# Patient Record
Sex: Male | Born: 1950 | Race: White | Hispanic: No | Marital: Married | Smoking: Never smoker
Health system: Southern US, Community
[De-identification: ages and names within clinical notes are randomized; demographics above are authoritative.]

## PROBLEM LIST (undated history)

## (undated) DIAGNOSIS — F1010 Alcohol abuse, uncomplicated: Secondary | ICD-10-CM

## (undated) DIAGNOSIS — E669 Obesity, unspecified: Secondary | ICD-10-CM

## (undated) DIAGNOSIS — H9319 Tinnitus, unspecified ear: Secondary | ICD-10-CM

## (undated) DIAGNOSIS — R0789 Other chest pain: Secondary | ICD-10-CM

## (undated) DIAGNOSIS — N400 Enlarged prostate without lower urinary tract symptoms: Secondary | ICD-10-CM

## (undated) DIAGNOSIS — I10 Essential (primary) hypertension: Secondary | ICD-10-CM

## (undated) DIAGNOSIS — I499 Cardiac arrhythmia, unspecified: Secondary | ICD-10-CM

## (undated) DIAGNOSIS — R519 Headache, unspecified: Secondary | ICD-10-CM

## (undated) DIAGNOSIS — C4490 Unspecified malignant neoplasm of skin, unspecified: Secondary | ICD-10-CM

## (undated) DIAGNOSIS — R51 Headache: Secondary | ICD-10-CM

## (undated) DIAGNOSIS — K5792 Diverticulitis of intestine, part unspecified, without perforation or abscess without bleeding: Secondary | ICD-10-CM

## (undated) DIAGNOSIS — K219 Gastro-esophageal reflux disease without esophagitis: Secondary | ICD-10-CM

## (undated) DIAGNOSIS — N189 Chronic kidney disease, unspecified: Secondary | ICD-10-CM

## (undated) DIAGNOSIS — R7303 Prediabetes: Secondary | ICD-10-CM

## (undated) DIAGNOSIS — J189 Pneumonia, unspecified organism: Secondary | ICD-10-CM

## (undated) DIAGNOSIS — D649 Anemia, unspecified: Secondary | ICD-10-CM

## (undated) DIAGNOSIS — F419 Anxiety disorder, unspecified: Secondary | ICD-10-CM

## (undated) DIAGNOSIS — M1990 Unspecified osteoarthritis, unspecified site: Secondary | ICD-10-CM

## (undated) DIAGNOSIS — E7800 Pure hypercholesterolemia, unspecified: Secondary | ICD-10-CM

## (undated) DIAGNOSIS — Z87442 Personal history of urinary calculi: Secondary | ICD-10-CM

## (undated) DIAGNOSIS — D72819 Decreased white blood cell count, unspecified: Secondary | ICD-10-CM

## (undated) DIAGNOSIS — C4491 Basal cell carcinoma of skin, unspecified: Secondary | ICD-10-CM

## (undated) DIAGNOSIS — K625 Hemorrhage of anus and rectum: Secondary | ICD-10-CM

## (undated) HISTORY — DX: Headache, unspecified: R51.9

## (undated) HISTORY — PX: HERNIA REPAIR: SHX51

## (undated) HISTORY — PX: BACK SURGERY: SHX140

## (undated) HISTORY — DX: Hemorrhage of anus and rectum: K62.5

## (undated) HISTORY — DX: Prediabetes: R73.03

## (undated) HISTORY — DX: Diverticulitis of intestine, part unspecified, without perforation or abscess without bleeding: K57.92

## (undated) HISTORY — DX: Basal cell carcinoma of skin, unspecified: C44.91

## (undated) HISTORY — DX: Obesity, unspecified: E66.9

## (undated) HISTORY — DX: Benign prostatic hyperplasia without lower urinary tract symptoms: N40.0

## (undated) HISTORY — DX: Headache: R51

## (undated) HISTORY — DX: Alcohol abuse, uncomplicated: F10.10

## (undated) HISTORY — DX: Pure hypercholesterolemia, unspecified: E78.00

## (undated) HISTORY — DX: Unspecified malignant neoplasm of skin, unspecified: C44.90

## (undated) HISTORY — DX: Other chest pain: R07.89

## (undated) HISTORY — PX: OTHER SURGICAL HISTORY: SHX169

## (undated) HISTORY — DX: Unspecified osteoarthritis, unspecified site: M19.90

## (undated) HISTORY — DX: Gastro-esophageal reflux disease without esophagitis: K21.9

## (undated) HISTORY — PX: EYE SURGERY: SHX253

## (undated) HISTORY — DX: Tinnitus, unspecified ear: H93.19

## (undated) HISTORY — DX: Decreased white blood cell count, unspecified: D72.819

---

## 1999-08-14 ENCOUNTER — Other Ambulatory Visit: Admission: RE | Admit: 1999-08-14 | Discharge: 1999-08-14 | Payer: Self-pay | Admitting: Podiatry

## 2000-04-06 ENCOUNTER — Encounter: Payer: Self-pay | Admitting: Emergency Medicine

## 2000-04-07 ENCOUNTER — Encounter: Payer: Self-pay | Admitting: Neurosurgery

## 2000-04-07 ENCOUNTER — Inpatient Hospital Stay (HOSPITAL_COMMUNITY): Admission: EM | Admit: 2000-04-07 | Discharge: 2000-04-09 | Payer: Self-pay | Admitting: Emergency Medicine

## 2000-04-21 ENCOUNTER — Encounter: Admission: RE | Admit: 2000-04-21 | Discharge: 2000-05-25 | Payer: Self-pay | Admitting: Neurosurgery

## 2001-09-11 HISTORY — PX: COLONOSCOPY: SHX174

## 2001-09-11 HISTORY — PX: OTHER SURGICAL HISTORY: SHX169

## 2001-11-02 ENCOUNTER — Encounter (INDEPENDENT_AMBULATORY_CARE_PROVIDER_SITE_OTHER): Payer: Self-pay | Admitting: Specialist

## 2001-11-02 ENCOUNTER — Ambulatory Visit (HOSPITAL_COMMUNITY): Admission: RE | Admit: 2001-11-02 | Discharge: 2001-11-02 | Payer: Self-pay | Admitting: *Deleted

## 2001-11-08 ENCOUNTER — Ambulatory Visit (HOSPITAL_BASED_OUTPATIENT_CLINIC_OR_DEPARTMENT_OTHER): Admission: RE | Admit: 2001-11-08 | Discharge: 2001-11-08 | Payer: Self-pay | Admitting: Orthopedic Surgery

## 2001-11-09 HISTORY — PX: OTHER SURGICAL HISTORY: SHX169

## 2002-08-17 ENCOUNTER — Encounter: Payer: Self-pay | Admitting: Family Medicine

## 2002-08-17 LAB — CONVERTED CEMR LAB
Blood Glucose, Fasting: 103 mg/dL
PSA: 1 ng/mL

## 2002-10-03 ENCOUNTER — Ambulatory Visit (HOSPITAL_BASED_OUTPATIENT_CLINIC_OR_DEPARTMENT_OTHER): Admission: RE | Admit: 2002-10-03 | Discharge: 2002-10-03 | Payer: Self-pay | Admitting: Orthopedic Surgery

## 2005-03-03 ENCOUNTER — Encounter (INDEPENDENT_AMBULATORY_CARE_PROVIDER_SITE_OTHER): Payer: Self-pay | Admitting: *Deleted

## 2005-03-03 ENCOUNTER — Ambulatory Visit (HOSPITAL_COMMUNITY): Admission: RE | Admit: 2005-03-03 | Discharge: 2005-03-03 | Payer: Self-pay | Admitting: *Deleted

## 2005-07-14 ENCOUNTER — Observation Stay (HOSPITAL_COMMUNITY): Admission: EM | Admit: 2005-07-14 | Discharge: 2005-07-16 | Payer: Self-pay | Admitting: Emergency Medicine

## 2005-07-16 ENCOUNTER — Ambulatory Visit: Payer: Self-pay

## 2005-07-16 ENCOUNTER — Ambulatory Visit: Payer: Self-pay | Admitting: Cardiology

## 2006-07-06 ENCOUNTER — Encounter: Admission: RE | Admit: 2006-07-06 | Discharge: 2006-07-06 | Payer: Self-pay | Admitting: *Deleted

## 2007-01-13 ENCOUNTER — Encounter: Admission: RE | Admit: 2007-01-13 | Discharge: 2007-01-13 | Payer: Self-pay | Admitting: Neurosurgery

## 2007-03-02 ENCOUNTER — Ambulatory Visit (HOSPITAL_COMMUNITY): Admission: RE | Admit: 2007-03-02 | Discharge: 2007-03-04 | Payer: Self-pay | Admitting: Neurosurgery

## 2007-12-14 ENCOUNTER — Encounter (INDEPENDENT_AMBULATORY_CARE_PROVIDER_SITE_OTHER): Payer: Self-pay | Admitting: *Deleted

## 2007-12-14 ENCOUNTER — Ambulatory Visit (HOSPITAL_COMMUNITY): Admission: RE | Admit: 2007-12-14 | Discharge: 2007-12-14 | Payer: Self-pay | Admitting: *Deleted

## 2008-07-21 ENCOUNTER — Inpatient Hospital Stay (HOSPITAL_COMMUNITY): Admission: RE | Admit: 2008-07-21 | Discharge: 2008-07-23 | Payer: Self-pay | Admitting: General Surgery

## 2008-08-31 ENCOUNTER — Encounter: Payer: Self-pay | Admitting: Family Medicine

## 2008-08-31 DIAGNOSIS — E7800 Pure hypercholesterolemia, unspecified: Secondary | ICD-10-CM | POA: Insufficient documentation

## 2008-08-31 DIAGNOSIS — Z87898 Personal history of other specified conditions: Secondary | ICD-10-CM

## 2009-04-29 DIAGNOSIS — E785 Hyperlipidemia, unspecified: Secondary | ICD-10-CM | POA: Insufficient documentation

## 2009-04-29 DIAGNOSIS — R0789 Other chest pain: Secondary | ICD-10-CM | POA: Insufficient documentation

## 2009-04-29 DIAGNOSIS — F1021 Alcohol dependence, in remission: Secondary | ICD-10-CM | POA: Insufficient documentation

## 2009-05-01 ENCOUNTER — Encounter: Payer: Self-pay | Admitting: Cardiovascular Disease

## 2009-06-14 ENCOUNTER — Encounter: Payer: Self-pay | Admitting: Cardiovascular Disease

## 2009-06-19 ENCOUNTER — Ambulatory Visit: Payer: Self-pay | Admitting: Cardiovascular Disease

## 2009-06-19 ENCOUNTER — Encounter: Payer: Self-pay | Admitting: Cardiology

## 2009-06-19 DIAGNOSIS — R002 Palpitations: Secondary | ICD-10-CM | POA: Insufficient documentation

## 2009-06-19 DIAGNOSIS — R079 Chest pain, unspecified: Secondary | ICD-10-CM | POA: Insufficient documentation

## 2009-06-25 ENCOUNTER — Encounter: Payer: Self-pay | Admitting: Cardiovascular Disease

## 2009-06-29 ENCOUNTER — Encounter: Payer: Self-pay | Admitting: Cardiology

## 2009-07-02 ENCOUNTER — Telehealth (INDEPENDENT_AMBULATORY_CARE_PROVIDER_SITE_OTHER): Payer: Self-pay | Admitting: *Deleted

## 2009-07-03 ENCOUNTER — Ambulatory Visit (HOSPITAL_COMMUNITY): Admission: RE | Admit: 2009-07-03 | Discharge: 2009-07-03 | Payer: Self-pay | Admitting: Cardiovascular Disease

## 2009-07-03 ENCOUNTER — Ambulatory Visit: Payer: Self-pay

## 2009-07-03 ENCOUNTER — Telehealth: Payer: Self-pay | Admitting: Cardiology

## 2009-07-03 ENCOUNTER — Encounter: Payer: Self-pay | Admitting: Cardiovascular Disease

## 2009-07-03 ENCOUNTER — Ambulatory Visit: Payer: Self-pay | Admitting: Cardiology

## 2009-07-03 ENCOUNTER — Encounter (HOSPITAL_COMMUNITY): Admission: RE | Admit: 2009-07-03 | Discharge: 2009-08-10 | Payer: Self-pay | Admitting: Cardiovascular Disease

## 2009-10-31 ENCOUNTER — Ambulatory Visit (HOSPITAL_COMMUNITY): Admission: RE | Admit: 2009-10-31 | Discharge: 2009-10-31 | Payer: Self-pay | Admitting: Internal Medicine

## 2010-12-24 NOTE — H&P (Signed)
NAMEGIORGI, DEBRUIN              ACCOUNT NO.:  [id number]   MEDICAL RECORD NO.:  [id number]          PATIENT TYPE:  OIB   LOCATION:  3031                         FACILITY:  [facility]   PHYSICIAN:  Hilda Lias, M.D.   DATE OF BIRTH:  Aug 22, 1950   DATE OF ADMISSION:  03/02/2007  DATE OF DISCHARGE:                              HISTORY & PHYSICAL   Mr. Capell is a gentleman who was seen because of back pain, radiation  to the right leg, mostly at the level of the thigh.  The pain is getting  worse, is excruciating.  He had been taking medication that is not  helping.  He had a MRI and sent to [place] for evaluation.   PAST MEDICAL HISTORY:  He has a lumbar diskectomy about 35 years ago.  He had surgery in shoulder twice.   ALLERGIES:  THE PATIENT ALLERGIC TO SULFA AND PERCODAN.   SOCIAL HISTORY:  Negative.   FAMILY HISTORY:  Unremarkable.   REVIEW OF SYSTEMS:  Positive for enlarged prostate, back pain,  indigestion.   PHYSICAL EXAMINATION:  GENERAL:  The patient came to my office, limp  from the right leg.  HEAD, NOSE AND THROAT:  Normal.  NECK:  Normal.  LUNGS:  Clear.  HEART:  Sounds normal.  ABDOMEN:  Normal.  EXTREMITIES:  Normal pulses.  NEURO:  He has straight leg raising, SLR is positive at 80 degrees in  the left side and 60 on the right side.  There is a decreased  flexibility of the lumbar spine.   The x-rays showed degenerative arthritis.  The MRI showed that he has a  herniated disk at the level of 2-3 central and to the right.   IMPRESSION:  Right L2-3 herniated disk with back pain.  Status post  lumbar diskectomy 24 years ago, degenerative disk disease.   RECOMMENDATIONS:  The patient is being admitted for surgery.  Procedure  will be a right L2 and 3 diskectomy.  He knows about the risks of  infection, CSF leak, and no improvement whatsoever and need for further  surgery.          ______________________________  Hilda Lias, M.D.    EB/MEDQ  D:   03/02/2007  T:  03/02/2007  Job:  [id number]

## 2010-12-24 NOTE — Op Note (Signed)
Donald Vaughn, Donald Vaughn              ACCOUNT NO.:  [id number]   MEDICAL RECORD NO.:  [id number]          PATIENT TYPE:  AMB   LOCATION:  ENDO                         FACILITY:  [facility]   PHYSICIAN:  Georgiana Spinner, M.D.    DATE OF BIRTH:  09/29/1950   DATE OF PROCEDURE:  12/14/2007  DATE OF DISCHARGE:                               OPERATIVE REPORT   PROCEDURE:  Upper endoscopy with biopsy.   INDICATIONS:  Abdominal pain.   ANESTHESIA:  Demerol 70 mg, Versed 7.5 mg.   PROCEDURE:  With the patient mildly sedated in the left lateral  decubitus position, the Pentax videoscopic endoscope was inserted in the  mouth and passed under direct vision through the esophagus which  appeared normal until we reached  the distal esophagus and there was a  question of Barrett's,  photographed and biopsied.  We entered into the  stomach.  The fundus, body, antrum, duodenal bulb, and the second  portion of  the duodenum appeared normal.  From this point,  the  endoscope was slowly withdrawn, taking circumferential views of duodenal  mucosa until the endoscope had been pulled back into the stomach and  placed in retroflexion to view the stomach from below.  The endoscope  was straightened and withdrawn taking circumferential views of remaining  gastric and esophageal mucosa.  The patient's vital signs and pulse  oximeter remained stable.  The patient tolerated the procedure well  without apparent complications.   FINDINGS:  Widely patent GE junction indicating laxity of the lower  esophageal sphincter, question of Barrett's esophagus,  biopsied.  Await  biopsy report.  The patient will call me for results and follow-up with  me as an outpatient.           ______________________________  Georgiana Spinner, M.D.     GMO/MEDQ  D:  12/14/2007  T:  12/14/2007  Job:  [id number]

## 2010-12-24 NOTE — Op Note (Signed)
NAMESHALAMAR, CRAYS              ACCOUNT NO.:  [id number]   MEDICAL RECORD NO.:  [id number]          PATIENT TYPE:  OIB   LOCATION:  3172                         FACILITY:  [facility]   PHYSICIAN:  Hilda Lias, M.D.   DATE OF BIRTH:  1951-02-16   DATE OF PROCEDURE:  03/02/2007  DATE OF DISCHARGE:                               OPERATIVE REPORT   PREOPERATIVE DIAGNOSES:  1. Right L2-L3 herniated disk with fragment.  2. Status post lumbar diskectomy 25 years ago.   POSTOPERATIVE DIAGNOSES:  1. Right L2-L3 herniated disk with fragment.  2. Status post lumbar diskectomy 25 years ago.   PROCEDURE:  1. Right L2-L3 laminotomy.  2. Removal of fragment in the body of L3.  3. Microscope.   SURGEON:  Hilda Lias, M.D.   ASSISTANT:  Clydene Fake, M.D.   CLINICAL HISTORY:  The patient was admitted because of back pain with  radiation down to the right leg.  X-rays show that he has a fragment at  the level of 2-3 to the right side.  The patient previously had surgery  in the lower lumbar area and he has some sclerotic changes.  The patient  wanted to proceed with surgery.   PROCEDURE IN DETAIL:  The patient was taken to the operating room and he  was positioned appropriately.  The back was prepped with DuraPrep.  X-  ray showed that we were at the level of L2.  From then on, drapes were  applied.  An incision from L2 to L3 was made.  With the microscope, we  did a laminotomy from L2 up on the upper part of L3 and would remove 1/3  of the medial facet.  The yellow ligament was also excised.  We brought  the microscope into the area.  Repeat x-rays showed indeed we were at  the level of 2-3.  From then on, we investigated the L2 nerve root and  it was completely clean.  At the level of L3, we did a foraminotomy and  there was no compromise.  Nevertheless, at the body of L2 subligamentous  space, there were two pieces of fragments which were removed.  At the  end, we have  decompression with the thecal sac and the L2 and L3 nerve  root.  Next, the ischial bone and root at the level of the shoulder, at  the level of axilla at both of them, the L2-L3 showed there were no  evidence of any fragment.  Foraminotomy was accomplished.  There was  plenty of room for the L2, L3 nerve roots.  Investigation medially of the canal was negative.  From then on, the  area was irrigated.  Valsalva maneuver was negative.  Then Depo-Medrol  and fentanyl were left in the epidural space and the wound was closed  with Vicryl and Steri-Strips.           ______________________________  Hilda Lias, M.D.     EB/MEDQ  D:  03/02/2007  T:  03/02/2007  Job:  [id number]

## 2010-12-24 NOTE — Op Note (Signed)
Donald Vaughn, Donald Vaughn              ACCOUNT NO.:  [id number]   MEDICAL RECORD NO.:  [id number]          PATIENT TYPE:  AMB   LOCATION:  DAY                          [facility]ACILITY[facility]  [facility]   PHYSICIAN:  Juanetta Gosling, MDDATE OF BIRTH:  [birth year]   DATE OF PROCEDURE:  07/21/2008  DATE OF DISCHARGE:                               OPERATIVE REPORT   PREOPERATIVE DIAGNOSIS:  Umbilical hernia.   POSTOPERATIVE DIAGNOSIS:  Umbilical hernia.   PROCEDURE:  1. Diagnostic laparoscopy.  2. Laparoscopic tacking of the mesh to the anterior abdominal wall.  3. Open umbilical hernia repair with 6.4 cm Proceed ventral patch.   SURGEON:  Troy Sine. Dwain Sarna, MD   ASSISTANT:  None.   ANESTHESIA:  General.   FINDINGS:  A 2.5 cm umbilical hernia with diastasis recti.   SPECIMENS:  None.   DRAINS:  None.   ESTIMATED BLOOD LOSS:  Minimal.   COMPLICATIONS:  None.   DISPOSITION:  To PACU in stable condition.   INDICATIONS:  The is a 60 year old male with a 3 or 4 month history of  increasing in size umbilical bulge.  He describes it as painful whenever  he sneezes or coughs.  He had a colonoscopy at age 11 which he reports  as normal.  On exam he had what appeared to be a 3 cm reducible tender  umbilical hernia with a diastasis recti present.  I discussed with him  that I wanted to do a diagnostic laparoscopy and possibly tack his mesh  and fix his umbilical hernia repair and he agreed to this.   PROCEDURE:  After informed consent was obtained the patient was taken to  the operating room.  He was administered 1 gram of intravenous cefazolin  without complication.  He then underwent general endotracheal anesthesia  without complication.  His abdomen was then prepped and draped in the  standard sterile surgical fashion.  Sequential compression devices were  in place during this procedure.  A surgical time-out was then performed.  A 5 mm incision was then made in the left upper quadrant and the  5 mm  OptiView trocar was then used to enter the abdomen without difficulty.  The abdomen was then insufflated to 15 mmHg pressure without  complication.  In viewing this, he had a small amount of material stuck  in his umbilical hernia.  This was reduced with the camera.  He had a  fairly small umbilical hernia there with a large diastasis above and I  elected to open him at that point and place a Proceed ventral patch.  I  then performed an infraumbilical curvilinear incision.  Dissection was  carried out down to the level of the umbilical stalk.  It was encircled  with a Kelly clamp.  This was then divided with electrocautery.  The  hole was about 2.5 cm at that point.  I then used a 6.4 cm Proceed  ventral patch and placed this intraabdominally.  I then placed an extra  5 mm port in the left lower quadrant.  I then used the ProTack to tack  the edges of  the Proceed ventral patch to the abdominal wall  successfully.  Upon completion of this I pulled the mesh up into  position and sutured the tails into position with a 2-0 Prolene in four  positions.  The mesh appeared to be in good position upon completion of  this and covered the defect thoroughly.  I then put the laparoscope back  in.  The mesh was still in good position with no evidence of any  injuries.  Upon completion of this, I then irrigated the wound.  Hemostasis was observed. The umbilicus was tacked back down with 3-0  Vicryl.  The dermis was closed with 4-0 Vicryl.  All skin incisions were  closed with 4-0 Monocryl in a subcuticular fashion.  Sterile dressing  was placed over the umbilicus.  The other incisions were closed with  Dermabond.  He was extubated in the operating room having tolerated this  well and transferred the recovery room.      Juanetta Gosling, MD  Electronically Signed     MCW/MEDQ  D:  07/21/2008  T:  07/21/2008  Job:  [id number]   cc:   Soyla Murphy. Renne Crigler, M.D.  Fax: [phone]

## 2010-12-27 NOTE — Procedures (Signed)
[facility]  Patient:    Donald Vaughn, Donald Vaughn Visit Number: [id number] MRN: [id number]          Service Type: END Location: ENDO Attending Physician:  Sabino Gasser Dictated by:   Sabino Gasser, M.D. Proc. Date: 11/02/01 Admit Date:  11/02/2001                             Procedure Report  PROCEDURE PERFORMED:  Colonoscopy.  ENDOSCOPIST:  Sabino Gasser, M.D.  INDICATIONS FOR PROCEDURE:  Hemoccult positivity.  ANESTHESIA:  Demerol 25 mg, Versed 2.5 mg.  DESCRIPTION OF PROCEDURE:  With the patient mildly sedated in the left lateral decubitus position, the Olympus videoscopic colonoscope was inserted in the rectum after rectal examination was performed and passed under direct vision into the cecum.  The cecum was identified by the ileocecal valve and appendiceal orifice.  In the cecum, the prep was slightly suboptimal, but no gross lesions were seen after washing.  From this point, the colonoscope was slowly withdrawn, taking circumferential views of the remaining colonic mucosa stopping only then in the rectum, which appeared normal on direct and showed hemorrhoids on retroflex view and also pull-through of the anal canal.  The endoscope was withdrawn.  Patients vital signs and pulse oximeter remained stable.  The patient tolerated the procedure well and without apparent complications.  FINDINGS:  Hemorrhoids, otherwise unremarkable examination.  PLAN:  See endoscopy note for further details. Dictated by:   Sabino Gasser, M.D. Attending Physician:  Sabino Gasser DD:  11/02/01 TD:  11/03/01 Job: [id number]

## 2010-12-27 NOTE — Op Note (Signed)
NAME:  Donald Vaughn, Donald Vaughn                        ACCOUNT NO.:  [id number]   MEDICAL RECORD NO.:  [id number]                   PATIENT TYPE:  AMB   LOCATION:  DSC                                  FACILITY:  [facility]   PHYSICIAN:  Robert A. Thurston Hole, M.D.              DATE OF BIRTH:  [birth year]   DATE OF PROCEDURE:  10/03/2002  DATE OF DISCHARGE:                                 OPERATIVE REPORT   PREOPERATIVE DIAGNOSIS:  Right shoulder partial rotator cuff tear with  acromioclavicular joint synovitis.   POSTOPERATIVE DIAGNOSIS:  Right shoulder partial rotator cuff tear with  acromioclavicular joint synovitis.   OPERATION:  1. Right shoulder examination under anesthesia, followed by arthroscopic     debridement partial rotator cuff tear.  2. Right shoulder distal clavicle revision and resection.   ANESTHESIA:  General.   SURGEON:  Robert A. Thurston Hole, M.D.   ASSISTANT:  Julien Girt, P.A.   OPERATIVE TIME:  40 minutes.   COMPLICATIONS:  None.   INDICATIONS FOR PROCEDURE:  Donald Vaughn is a 60 year old gentleman who had  previously undergone a right shoulder arthroscopy with subacromial  decompression partial rotator cuff, debridement of distal clavicle, excision  approximately 11 months ago secondary to workman's compensation injury.  He  has had persistent pain in this shoulder despite adequate conservative care  with repeat MRI exam showing persistent synovitis and tendonitis and  synovitis in the acromioclavicular joint and is now to undergo a arthroscopy  and revision of his distal clavicle excision.   PROCEDURE:  Donald Vaughn is brought to the operating room, 10/03/02 with a  intrascalene block and replaced by anesthesia in preoperative room for  postoperative pain control.  He was placed on the operating table in the  supine position.  After being placed under general anesthesia, his right  shoulder was examined under anesthesia.  He had full range of motion and his  shoulder was stable to ligamentous examination. His shoulder and arm was  prepped and draped using sterile technique.  Originally through a posterior  arthroscopic portal a pump attachment was placed into an anterior portal and  arthroscopic probe was placed.  On initial inspection of the articular  cartilage and the humeral joint was intact, anterior and posterior labrum  was intact.  Superior labrum, biceps tendon and ankle was intact.  Biceps  tendon was intact, inferior labrum, anterior and inferior humeral ligament  complexes intact.  Rotator cuff showed mild fraying, 10% on the undersurface  which was debrided.  The rest of the rotator cuff was intact and the  inferior capsular recess was free of pathology.  Subacromial space was  entered and lateral arthroscopic portal was made.  Previous bursectomy was  still satisfactory.  Previous subacromial decompression and I had a slight  revision to this, removing another 2-3 mm of the undersurface of the  anterior acromion.  Rotator cuff showed mild fraying, partial tearing of  the  supraspinatus, about 20%, which was debrided, otherwise, this was intact.  At this point, arthroscopic instruments were removed and a 2 cm longitudinal  incision was made over the acromioclavicular joint region.  The underlying  subcutaneous tissues were incised in line with the skin incision.  The  acromioclavicular capsule was incised and the region in the previous distal  clavicle incision was exposed.  Significant synovitis was found in this area  which was thoroughly debided and another 4-5 mm of distal clavicle was  resected with an oscillating saw.  After this was done, there was no further  synovitis from impingement at this joint and the shoulder could be walked  through a full range of motion with no impingement.  At this point, the  wound was irrigated and closed.  The acromioclavicular joint capsule was  closed with 2-0 Vicryl, subcutaneous tissues  closed with 2-0 Vicryl.  Subcuticular layer closed with 3-0 Prolene.  Steri-Strips were applied.  Sterile dressings and a sling applied and the patient awakened and taken to  the recovery room in stable condition.   FOLLOWUP:  Donald Vaughn will be followed as an outpatient on Percocet and  Arthrotec.  Begin early range of motion.  See him back in the office in a  week for sutures out and followed.  Begin early physical therapy.                                                Robert A. Thurston Hole, M.D.    RAW/MEDQ  D:  10/03/2002  T:  10/03/2002  Job:  [id number]   cc:   Workman's Compensation

## 2010-12-27 NOTE — H&P (Signed)
NAMEHERMILO, Donald Vaughn              ACCOUNT NO.:  [id number]   MEDICAL RECORD NO.:  [id number]          PATIENT TYPE:  INP   LOCATION:  1826                         FACILITY:  [facility]   PHYSICIAN:  Lonia Blood, M.D.      DATE OF BIRTH:  05-Mar-1951   DATE OF ADMISSION:  07/14/2005  DATE OF DISCHARGE:                                HISTORY & PHYSICAL   PRIMARY CARE PHYSICIAN:  Soyla Murphy. Renne Crigler, M.D.   CARDIOLOGIST:  Jaclyn Prime. Lucas Mallow, M.D.   CHIEF COMPLAINT:  Left-sided chest pain.   HISTORY OF PRESENT ILLNESS:  The patient is a 60 year old gentleman with no  significant past medical history except for remote BPH as well as prior  stress test that was normal about 3 years ago.  The patient came in  secondary to sudden onset of sharp, left-sided chest pain starting from  under his left ribs going under his left arm, not associated with shortness  of breath.  The patient felt some more pressure in his chest than anything  else.  He denied any diaphoresis.  Pain has since resolved, it lasted just a  few seconds.  He has had some mild recurrence while in the ED, otherwise, no  other complaint.  No cough and no fever.  He has been fairly active.  His  pain was not reproducible in the ED, but per patient after he stretched out  his chest muscles.   PAST MEDICAL HISTORY:  Mainly BPH.  The patient has not been on any  medications.  He gets regular checkups.  He only takes [employer] for  that.  Also history of dyslipidemia at one time.   MEDICATIONS:  None.   ALLERGIES:  The patient is allergic to SULFA, CODEINE, PERCODAN.   SOCIAL HISTORY:  The patient is married.  Denied any tobacco or alcohol use.  He has two grown children, 31 and 30, and they are healthy.  He works with  [employer] and he has been fairly active.   FAMILY HISTORY:  Denied any family history of coronary artery disease,  hypertension, or diabetes.   REVIEW OF SYSTEMS:  10-point review  of systems essentially as in HPI.   PHYSICAL EXAMINATION:  VITAL SIGNS:  The patient is afebrile with a  temperature of 98.5, blood pressure initially 145/88, currently 124/83,  pulse 91, respiratory rate 20, saturations 97% on room air.  GENERAL:  He is alert, oriented, very cumbersome man, slightly anxious, in  no acute distress.  HEENT:  PERRL, EOMI.  NECK:  Supple.  No JVD, no lymphadenopathy.  LUNGS:  Good air entry bilaterally.  No wheezes or rales.  CARDIOVASCULAR:  He has regular rate and rhythm.  ABDOMEN:  Soft and nontender with positive bowel sounds.  EXTREMITIES:  No cyanosis, clubbing, or edema.   LABORATORY DATA:  Sodium 138, potassium 4.1, chloride 106, BUN 20, glucose  97, bicarb 27.7.  His creatinine is 1.2, initial cardiac enzymes in the ED  negative.   Chest x-ray no acute disease.   EKG showed normal sinus rhythm with a rate  of 92, normal intervals, normal  axis.  There is what appears to be incomplete right bundle branch block, but  no old EKG to compare.   ASSESSMENT:  This is a 60 year old gentleman with risk factors for heart  disease, mainly history of dyslipidemia and male sex, presenting with what  appears to be sudden onset of left-sided chest pain.  The patient is low to  moderate risk for heart disease.  However, because of the ongoing chest  pain, we will admit the patient for rule out MI.  Other differentials like  dissection have been ruled out from the chest x-ray.  This could also be  esophageal in nature like esophageal tear or GERD, but based on the  symptomatology it did not sound like that.  Most likely this is all  musculoskeletal.  However, we will not dismiss other courses until we rule  them out especially cardiac in nature.   PLAN:  Chest pain.  We will admit the patient for 23-hour observation.  Check serial cardiac enzymes.  Give him some pain control.  He is on  morphine and Tylenol.  We will give him aspirin now.  We will follow  his  cardiac enzymes.  If his cardiac enzymes are negative overnight, we will  arrange for outpatient Cardiolite or some form of exercise stress test for  risk stratification.      Lonia Blood, M.D.  Electronically Signed     LG/MEDQ  D:  07/14/2005  T:  07/15/2005  Job:  [id number]

## 2010-12-27 NOTE — Consult Note (Signed)
Donald Vaughn, Donald Vaughn              ACCOUNT NO.:  [id number]   MEDICAL RECORD NO.:  [id number]          PATIENT TYPE:  INP   LOCATION:  2028                         FACILITY:  [facility]   PHYSICIAN:  Rollene Rotunda, M.D.   DATE OF BIRTH:  [birth year]   DATE OF CONSULTATION:  07/15/2005  DATE OF DISCHARGE:                                   CONSULTATION   CARDIOLOGY CONSULTATION   PRIMARY CARE PHYSICIAN:  Soyla Murphy. Renne Crigler, M.D.   REASON FOR CONSULTATION:  Evaluate patient with chest pain.   HISTORY OF PRESENT ILLNESS:  Very pleasant 60 year old white gentleman who  reports a stress test many years ago that was apparently normal.  He has  been well otherwise.  He has had some palpitations where he describes a  fluttering.  He points to his left breast.  He feels it sporadically.  It  seemed to increase over a few days.  Yesterday he developed this along with  some chest discomfort. It was again under the left breast.  He described a  burning.  It was mild.  He had some shooting discomfort up into his left arm  which prompted him to come to the emergency room.  Thus far enzymes have  been negative.  Electrocardiogram was without acute evidence of ischemia.  The patient otherwise denies any exertional chest discomfort, neck  discomfort, arm discomfort, activity induced nausea, vomiting, or excessive  diaphoresis. He has had no presyncope or syncope.  He denies any paroxysmal  nocturnal dyspnea, orthopnea.   PAST MEDICAL HISTORY:  He has had dyslipidemia which he has tried to manage  with diet.  (Lipids here demonstrate HDL 33, LDL 127).  The patient has no  history of hypertension, diabetes.   PAST SURGICAL HISTORY:  Rotator cuff surgery, back surgery, trauma to his  left foot in 7th grade with bird shot.   SOCIAL HISTORY:  The patient is married.  He quit drinking at age 76.  He  does not smoke cigarettes. He works for [employer].   FAMILY HISTORY:  Noncontributory for early coronary  disease.   REVIEW OF SYSTEMS:  Positive for back pain and occasions headaches,  otherwise as stated in the history of present illness and negative for all  other systems.   PHYSICAL EXAMINATION:  GENERAL APPEARANCE:  The patient is in no distress.  VITAL SIGNS:  Blood pressure 136/71, heart rate 69 and regular, respirations  20, afebrile.  HEENT:  Eyelids unremarkable, pupils equal, round, reactive to light, fundi  not visualized, oral mucosa unremarkable.  NECK:  No jugular venous distention, wave form within normal limits, carotid  upstroke brisk and symmetric. No bruits, no thyromegaly.  LYMPH NODES:  Lymphatics with no cervical, axillary or inguinal adenopathy.  LUNGS:  Clear to auscultation bilaterally.  BACK:  No costovertebral angle tenderness.  CHEST:  Unremarkable.  HEART:  PMI not displaced or sustained, S1 and S2 within normal limits. No  S3, no S4, no murmurs.  ABDOMEN:  Flat, positive bowel sounds, normal in frequency and pitch.  No  bruits, guarding, rebound, midline pulses, no masses, hepatosplenomegaly.  SKIN: No rashes.  No nodules.  EXTREMITIES:  2+ pulses throughout.  No edema, no cyanosis and no clubbing.  NEUROLOGICAL:  Oriented to person, place and time.  Cranial nerves II-XII  grossly intact.  Motor grossly intact throughout.   CLINICAL DATA:  Electrocardiogram with sinus rhythm, rate 92, axis within  normal limits, intervals within normal limits, incomplete right bundle  branch block.   LABORATORY DATA:  Sodium 138, potassium 4.1, BUN 16, creatinine 1.1. INR  1.1.  Hemoglobin 14.8, white blood cell count 5.2. TSH 0.644. BNP less than  30.  Point of care markers negative X2.   ASSESSMENT/PLAN:  1.  Chest pain.  The patient's chest pain is somewhat atypical.  There is      risk factor of dyslipidemia.  Pretest probability for obstructive      coronary disease is somewhat low to moderate.  Stress perfusion study is      indicated.  It may  be safe to do this  tomorrow morning as an outpatient      and I will arrange this.  We will cycle enzymes again.  2.  Palpitations.  The patient was having significant palpitations.  He has      only been on telemetry up here for a short time.  Will monitor him on      telemetry overnight.  He may need an outpatient Holter monitor.  3.  Dyslipidemia.  Treatment will be based on the results of his stress      perfusion study.  He needs at least dietary modification.           ______________________________  Rollene Rotunda, M.D.     JH/MEDQ  D:  07/15/2005  T:  07/16/2005  Job:  [id number]   cc:   Soyla Murphy. Renne Crigler, M.D.  Fax: [phone]

## 2010-12-27 NOTE — Op Note (Signed)
NAMESARGENT, Donald Vaughn              ACCOUNT NO.:  [id number]   MEDICAL RECORD NO.:  [id number]          PATIENT TYPE:  AMB   LOCATION:  ENDO                         FACILITY:  [facility]   PHYSICIAN:  Georgiana Spinner, M.D.    DATE OF BIRTH:  [birth year]   DATE OF PROCEDURE:  03/03/2005  DATE OF DISCHARGE:                                 OPERATIVE REPORT   PROCEDURE:  Upper endoscopy.   INDICATIONS:  Gastroesophageal reflux disease.   ANESTHESIA:  Demerol 60, Versed 7 mg.   DESCRIPTION OF PROCEDURE:  With with the patient mildly sedated in the left  lateral decubitus position, the Olympus videoscopic endoscope was inserted  in the mouth and passed under direct vision into the esophagus which  appeared normal. There was a questionable area of Barrett's seen distally.  This was photographed and biopsied. We then entered in the stomach, the  fundus, body, antrum, duodenal bulb and second portion of duodenum all  appeared normal. From this point, the endoscope was slowly withdrawn taking  circumferential views of duodenal mucosa until the endoscope had been pulled  back in the stomach, placed in retroflexion to view the stomach from below.  The endoscope was straightened, withdrawn taking circumferential views of  the remaining gastric and esophageal mucosa. The patient's vital signs and  pulse oximeter remained stable. The patient tolerated the procedure well  without apparent complications.   FINDINGS:  Question of Barrett's esophagus biopsied. Await biopsy report.  The patient will call me for results and follow-up with me as an outpatient.  Proceed to colonoscopy as planned.       GMO/MEDQ  D:  03/03/2005  T:  03/03/2005  Job:  [id number]

## 2010-12-27 NOTE — Procedures (Signed)
[place]. [facility]  Patient:    Donald Vaughn, Donald Vaughn Visit Number: [id number] MRN: [id number]          Service Type: END Location: ENDO Attending Physician:  Sabino Gasser Dictated by:   Sabino Gasser, M.D. Proc. Date: 11/02/01 Admit Date:  11/02/2001                             Procedure Report  PROCEDURE PERFORMED:  Upper endoscopy with biopsy.  ENDOSCOPIST:  Sabino Gasser, M.D.  INDICATIONS FOR PROCEDURE:  Gastroesophageal reflux disease.  ANESTHESIA:  Demerol 75 mg, Versed 7.5 mg.  DESCRIPTION OF PROCEDURE:  With the patient mildly sedated in the left lateral decubitus position, the Olympus video endoscope was inserted in the mouth and passed under direct vision through the esophagus which appeared normal until we reached the distal esophagus and there was a question of whether there was Barretts esophagus or just a normal amount of erythema extending cephalad into the esophagus.  This was photographed and biopsied.  We entered into the stomach.  The fundus, body appeared normal.  Antrum posteriorly showed some erythema localized to the prepyloric area, photographed and biopsied.  We entered into the duodenal bulb and second portion of the duodenum and both appeared normal.  From this point, the endoscope was slowly withdrawn taking circumferential views of the entire duodenal mucosa until the endoscope had been pulled back into the stomach and placed on retroflexion to view the stomach from below.  The endoscope was then straightened and withdrawn taking circumferential views of the remaining gastric and esophageal mucosa. Patients vital signs and pulse oximeter remained stable.  The patient tolerated the procedure well without apparent complications.  FINDINGS:  Erythema of antrum biopsied and erythema of distal esophagus biopsied.  Await biopsy report.  Patient will call me for results and follow up with me as an outpatient.  Proceed to colonoscopy as  planned.                                                                    s Dictated by:   Sabino Gasser, M.D. Attending Physician:  Sabino Gasser DD:  11/02/01 TD:  11/03/01 Job: [id number]

## 2010-12-27 NOTE — Discharge Summary (Signed)
NAMEBAILEY, KOLBE              ACCOUNT NO.:  [id number]   MEDICAL RECORD NO.:  [id number]          PATIENT TYPE:  INP   LOCATION:  2028                         FACILITY:  [facility]   PHYSICIAN:  Soyla Murphy. Renne Crigler, M.D.  DATE OF BIRTH:  September 29, 1950   DATE OF ADMISSION:  07/14/2005  DATE OF DISCHARGE:  07/16/2005                                 DISCHARGE SUMMARY   DISCHARGE DIAGNOSES:  1.  Atypical chest pain, negative cardiac enzymes.  2.  Dyslipidemia.  3.  Benign prostatic hypertrophy.   PAST MEDICAL HISTORY:  1.  Positive for dyslipidemia with diet management.  2.  Rotator cuff surgery.  3.  Back surgery.  4.  Trauma to his left foot.  5.  ETOH use, quit drinking at age 44.   HISTORY OF PRESENT ILLNESS:  Mr. Fennell is a 60 year old Caucasian  gentleman who presented to [facility] with complaints of chest  pain. The patient reports having a stress test many years ago that  apparently was normal. Also had complained of periodic palpitation, which he  described as a fluttering sensation in his left chest. He presented to [facility] with chest pain that increased over the last few days prior to  admission. EKG was without acute evidence of ischemia. The patient denied  any associated symptoms. Blood pressure was stable at 136/71 with a heart  rate of 69. Point of care markers were negative. BNP less than 30 with a TSH  of 0.644. Hemoglobin 14.8.   HOSPITAL COURSE:  The patient was admitted for observation. Cardiac enzymes  were cycled. The patient without further episodes of chest discomfort. EKG  unremarkable.   DISPOSITION:  The patient discharged home. Scheduled for a dated Myoview at  our office on the day of discharge. The patient was discharged from unit  with Hep normal saline lock intact. For scheduled Myoview at 1:00 p.m. on  day of discharge.   FOLLOW UP:  The patient was instructed to followup with Dr. Antoine Poche on  July 18, 2006 at 10:00 a.m.  for post hospitalization/discuss results of  stress test.   DIET:  The patient was instructed to follow a low-fat, low-salt, low-  cholesterol diet.   DISCHARGE MEDICATIONS:  Use Tylenol or Ibuprofen for discomfort.   CONDITION ON DISCHARGE:  At time of discharge, patient was with stable vital  signs and afebrile.   LABORATORY DATA:  Showed a total cholesterol of 185, triglycerides 127, HDL  33, LDL of 127.   DURATION OF DISCHARGE ENCOUNTER:  30 minutes.      Dorian Pod, NP    ______________________________  Soyla Murphy. Renne Crigler, M.D.    MB/MEDQ  D:  09/05/2005  T:  09/06/2005  Job:  [id number]   cc:   Soyla Murphy. Renne Crigler, M.D.  Fax: [phone]   Rollene Rotunda, M.D.  [phone] [address]  [address]  [place]  [place] [address]

## 2010-12-27 NOTE — Op Note (Signed)
[place]. [facility]  Patient:    Donald Vaughn, Donald Vaughn Visit Number: [id number] MRN: [id number]          Service Type: END Location: ENDO Attending Physician:  Sabino Gasser Dictated by:   Elana Alm. Thurston Hole, M.D. Proc. 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  POSTOPERATIVE DIAGNOSIS:  Right shoulder partial rotator cuff tear and impingement with AC joint arthropathy.  PROCEDURE: 1. Right shoulder EUA followed by arthroscopic partial rotator cuff tear    debridement. 2. Right shoulder subacromial decompression. 3. Right shoulder distal clavicle excision.  SURGEON:  Elana Alm. Thurston Hole, M.D.  ASSISTANT:  Julien Girt, P.A.  ANESTHESIA:  General.  OPERATIVE TIME:  45 minutes.  COMPLICATIONS:  None.  INDICATIONS FOR PROCEDURE:  Donald Vaughn is a 60 year old gentleman who injured his right shoulder at work on June 10, 2001.  Significant pain and persistent pain despite conservative care, with MRI documenting partial rotator cuff tear and impingement with AC joint arthropathy, who has failed conservative care and is now to undergo arthroscopy.  DESCRIPTION:  Donald Vaughn was brought to the operating room on November 08, 2001 after a supraclavicular block had been placed in the holding room.  He was placed on the operative table in supine position.  His right shoulder was examined under anesthesia, had full range of motion, and his shoulder was stable to ligamentous exam.  He was then placed in a beach chair position and his shoulder and arm were prepped using sterile Betadine and draped using sterile technique.  Originally, through a posterior arthroscopic portal, the arthroscope with a pump attached was placed, and through an anterior portal an arthroscopic  probe was placed.  On initial inspection, the articular cartilage in the glenohumeral joint was intact, the anterior labrum was intact, superior labrum and biceps tendon anchor were intact, biceps tendon was intact, inferior labrum and anterior inferior glenohumeral ligament complex were intact, and the posterior labrum was intact.  The rotator cuff on the articular surface showed partial tearing of 20% of the infraspinatus, which was debrided; the rest of the rotator cuff was intact.  The inferior capsule recess was free of pathology.  The subacromial space was entered and a lateral arthroscopic portal was made.  Moderately thickened bursitis was resected. Underneath this the rotator cuff had partial tearing which was thoroughly debrided, but a complete tear was not found.  Subacromial decompression was carried out, removing 6-8 mm of the undersurface of the anterior, anterolateral, and anteromedial acromion, and a CA ligament release carried out as well.  The [facility] joint was exposed.  There was significant spurring and arthrosis in this joint and arthroscopically-assisted through the anterior portal, a 6 mm burr was used to resect the distal 5-6 mm of the clavicle and the spurring noted in this area.  After this was done, there was found to be satisfactory resection of the distal clavicle with no impingement on the rotator cuff.  At this point the arthroscopic instruments were removed.  The portals were closed with 3-0 nylon suture and injected with 0.25% Marcaine with epinephrine.  Sterile dressings and a sling were applied and the patient was awakened and taken to the recovery room in stable condition.  FOLLOW-UP CARE:  Donald Vaughn will be followed as an outpatient on Vicodin  and Naprosyn.  See him back in the office in a week for sutures out and follow-up. Begin early physical therapy for passive and active range of motion.  He will be in a sling for three weeks. Dictated by:   Elana Alm Thurston Hole, M.D. Attending Physician:  Sabino Gasser DD:  11/08/01 TD:  11/08/01 Job: [id number]

## 2010-12-27 NOTE — Op Note (Signed)
NAMEBRYTON, ROMAGNOLI              ACCOUNT NO.:  [id number]   MEDICAL RECORD NO.:  [id number]          PATIENT TYPE:  AMB   LOCATION:  ENDO                         [facility]ACILITY[facility]  [facility]   PHYSICIAN:  Georgiana Spinner, M.D.    DATE OF BIRTH:  12-10-1950   DATE OF PROCEDURE:  03/03/2005  DATE OF DISCHARGE:                                 OPERATIVE REPORT   PROCEDURE:  Colonoscopy.   INDICATIONS:  Colon polyp.   ANESTHESIA:  Demerol 20, Versed 2 mg.   DESCRIPTION OF PROCEDURE:  With patient mildly sedated in the left lateral  decubitus position, the Olympus videoscopic colonoscope was inserted into  the rectum after normal rectal exam and passed under direct vision to the  cecum, identified by ileocecal valve and appendiceal orifice, both of which  were photographed.  From this point, the colonoscope was slowly withdrawn,  taking circumferential views of the colonic mucosa, stopping in the sigmoid  area where a small polypoid area was seen, and photographed, and removed  using hot biopsy forceps technique, setting of 20/200 blended current.  We  then withdrew to the rectum which appeared normal on retroflexed view.  The  endoscope was straightened and withdrawn.  The patient's vital signs and  pulse oximeter remained stable.  The patient tolerated the procedure well  without apparent complications.   FINDINGS:  Question of polyp in the sigmoid area.  Await biopsy report.  The  patient will call me for results and follow up with me as an outpatient.       GMO/MEDQ  D:  03/03/2005  T:  03/03/2005  Job:  [id number]

## 2010-12-27 NOTE — Discharge Summary (Signed)
. [facility]  Patient:    Donald Vaughn, FRASIER                       MRN: [id number] Adm. Date:  04/06/00 Disc. Date: 04/09/00 Attending:  Tanya Nones. Jeral Fruit, M.D.                           Discharge Summary  ADMISSION DIAGNOSIS:  Back and left leg pain.  FINAL DIAGNOSES: 1. Back and left leg pain. 2. 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 3. Degenerative disk disease.  HISTORY OF PRESENT ILLNESS:  The patient was admitted by Danae Orleans. Venetia Maxon, M.D., from the emergency room because of back and left leg pain.  The patient had surgery before by a neurosurgeon who has retired since.  I saw him in my office about six years ago.  Because of worsening of the pain, the patient was admitted to have an MRI.  LABORATORY DATA:  Normal.  HOSPITAL COURSE:  The patient had an MRI which showed that indeed he has degenerative disk disease at the level of L2-3 with stenosis at the level of L5-S1.  The patient was started on intravenous morphine and had an epidural injection, which improved his pain overall.  He has been seen by physical therapy.  DISPOSITION:  Today he is being discharged to be followed by me.  CONDITION ON DISCHARGE:  Improving.  DISCHARGE MEDICATIONS:  Darvocet and Flexeril.  DIET:  Regular.  ACTIVITY:  He is not to drive.  He is not to do any lifting.  FOLLOW-UP:  To be seen by me in a week. DD:  04/09/00 TD:  04/09/00 Job: [id number]

## 2010-12-27 NOTE — H&P (Signed)
[place]. [facility]  Patient:    Donald Vaughn, Donald Vaughn                       MRN: [id number] Adm. Date:  04/06/00 Attending:  Danae Orleans. Venetia Maxon, M.D.                         History and Physical  REASON FOR ADMISSION:  Severe intractable low-back pain.  HISTORY OF ILLNESS:  [name] is a 60 year old man status post left L5, S1 laminectomy and diskectomy by Dr. Pat Patrick approximately 20 years ago. He saw Dr. Jeral Fruit six years ago for flare up of low-back and leg pain.  He now presents with a one-week history of left lower extremity and low-back pain after straining while lifting his mother who fell.  She has Alzheimers disease.  He works for [employer] and states he was working in a [employer] today and he twisted, and he had severe low-back and left lower extremity pain.  He came to [facility] emergency room where he was evaluated, and received morphine, prednisone and Valium.  He was still unable to stand or bear weight without severe left lower extremity and low-back.  He had an MRI done in the emergency room, which shows degenerative disk disease at L5, S1 on the left with a bulging right L2-3 disk.  There are no other significant findings noted.  Plane x-rays showed normal alignment of the lumbar spine with some degenerative changes at the L5, S1 level.  The patient is being admitted for bedrest and pain control.  He denies any bowel or bladder dysfunction.  PAST MEDICAL HISTORY:  He has had prior left foot surgery, a lumbar laminectomy and has a history of benign prostatic hypertrophy.  ALLERGIES:  PERCODAN, CODEINE and SULFA.  HABITS:  He is a nonsmoker and nondrinker.  MEDICATIONS:  Sal[place]to, otherwise no medications.  NEUROLOGICAL EXAMINATION:  On examination he is awake, alert and conversant.  Pupils are round, equal and react to light.  Extraocular movements are intact.  Facial sensation:  Facial motor intact and symmetric.  Hearing is  intact to finger rub.  Palate is upgoing.  Shoulder shrug is symmetric.  He moves all extremities well.  He does have low back pain with movement of his lower extremities, particularly his left lower extremity.  He has left paraspinous muscular spasm to palpation, negative on the right.  He does not have severe midline low-back pain.  He has minimal sciatic notch discomfort.  He has 5/5 bilateral upper extremity strength in all motor groups bilaterally symmetric.  Lower extremity strength in full in all motor groups and bilaterally symmetric.  He is limited in his ability to move his left lower extremity secondary to back and leg pain.  With Patricks maneuver he complains more of low back pain than of hip pain.  Straight leg raise is positive on the left to 15 degrees with severe low back pain and right-sided straight leg raise causes low back pain on the left as well.  He denies any numbness in his lower extremities.  Reflexes are 2 in the biceps, triceps and brachioradialis, 2 at the knees and 2 at the knees.  Toes are downgoing with plantar stimulation.  IMPRESSION:  [name] is a 60 year old man with severe low-back strain and left lower extremity pain.  He is status post lumbar diskectomy approximately 20 years ago.  He is unable to bear  weight secondary to pain. He is being admitted for pain management and bedrest. DD:  04/07/00 TD:  04/07/00 Job: [id number]

## 2011-02-26 ENCOUNTER — Encounter: Payer: Self-pay | Admitting: Cardiology

## 2011-05-16 LAB — COMPREHENSIVE METABOLIC PANEL
AST: 21 U/L (ref 0–37)
Albumin: 4 g/dL (ref 3.5–5.2)
BUN: 14 mg/dL (ref 6–23)
CO2: 32 mEq/L (ref 19–32)
Calcium: 9 mg/dL (ref 8.4–10.5)
Chloride: 106 mEq/L (ref 96–112)
GFR calc Af Amer: >60 mL/min (ref >60)
GFR calc non Af Amer: 59 mL/min — ABNORMAL LOW (ref >60)

## 2011-05-16 LAB — CBC
Hemoglobin: 15 g/dL (ref 13.0–17.0)
MCHC: 34 g/dL (ref 30.0–36.0)
Platelets: 175 10*3/uL (ref 150–400)
RBC: 4.64 MIL/uL (ref 4.22–5.81)
RDW: 13 % (ref 11.5–15.5)

## 2011-05-16 LAB — DIFFERENTIAL
Eosinophils Absolute: 0.1 10*3/uL (ref 0.0–0.7)
Lymphocytes Relative: 26 % (ref 12–46)
Monocytes Absolute: 0.4 10*3/uL (ref 0.1–1.0)
Neutrophils Relative %: 63 % (ref 43–77)

## 2011-05-26 LAB — CBC
MCHC: 34.4
MCV: 93.5
Platelets: 197
RBC: 4.54

## 2011-06-16 ENCOUNTER — Encounter (INDEPENDENT_AMBULATORY_CARE_PROVIDER_SITE_OTHER): Payer: Self-pay | Admitting: General Surgery

## 2011-06-16 ENCOUNTER — Ambulatory Visit (INDEPENDENT_AMBULATORY_CARE_PROVIDER_SITE_OTHER): Payer: BC Managed Care – PPO | Admitting: General Surgery

## 2011-06-16 VITALS — BP 156/98 | HR 64 | Temp 97.6°F | Resp 20 | Ht 68.0 in | Wt 211.0 lb

## 2011-06-16 DIAGNOSIS — R1012 Left upper quadrant pain: Secondary | ICD-10-CM

## 2011-06-16 NOTE — Progress Notes (Signed)
Subjective:     Patient ID: Donald Vaughn, male   DOB: 07/20/1951, 60 y.o.   MRN: [id number]  HPI This is a 60 year old male who I know from repairing the umbilical hernia repair with mesh previously. He has had some issues with some left upper quadrant pain as well as some reflux. He is undergoing an evaluation both in terms of his general medical status for his allergic reactions, history of chest pain and palpitations. He also has undergone a colonoscopy which was negative and is due for one and 20/20. His laboratory evaluation I had today is otherwise fairly normal. He does complain of some left upper quadrant pain and he feels like it from the inside and is worse with any sort of activity he does not have any nausea or any difficulty eating. He can't really identify the exact. He does not feel that there is a mass there but it is getting worse over this time period  Review of Systems     Objective:   Physical Exam  Abdominal: Soft. He exhibits no mass. There is tenderness (mild tenderness luq). There is no rebound and no guarding. No hernia. Hernia confirmed negative in the ventral area.         Assessment:     LUQ pain, ? Hernia vs mass    Plan:        I'm not sure exactly what's causing him his pain. It's really not related to his umbilical hernia repair before. I can identify anything on his exam he is very concerning to have a hernia or a mass in this location. We discussed getting a CT scan to ensure there's no areas this area has been causing a fair amount of pain has been worsening. I will follow up with him after the CT scan.

## 2011-06-19 ENCOUNTER — Encounter (HOSPITAL_COMMUNITY): Payer: Self-pay

## 2011-06-19 ENCOUNTER — Telehealth (INDEPENDENT_AMBULATORY_CARE_PROVIDER_SITE_OTHER): Payer: Self-pay

## 2011-06-19 ENCOUNTER — Other Ambulatory Visit (INDEPENDENT_AMBULATORY_CARE_PROVIDER_SITE_OTHER): Payer: Self-pay | Admitting: General Surgery

## 2011-06-19 ENCOUNTER — Ambulatory Visit (HOSPITAL_COMMUNITY)
Admission: RE | Admit: 2011-06-19 | Discharge: 2011-06-19 | Disposition: A | Discharge status: Home / Self Care | Payer: BC Managed Care – PPO | Source: Ambulatory Visit | Attending: General Surgery | Admitting: General Surgery

## 2011-06-19 DIAGNOSIS — R1012 Left upper quadrant pain: Secondary | ICD-10-CM

## 2011-06-19 DIAGNOSIS — M5137 Other intervertebral disc degeneration, lumbosacral region: Secondary | ICD-10-CM | POA: Insufficient documentation

## 2011-06-19 DIAGNOSIS — M51379 Other intervertebral disc degeneration, lumbosacral region without mention of lumbar back pain or lower extremity pain: Secondary | ICD-10-CM | POA: Insufficient documentation

## 2011-06-19 DIAGNOSIS — K5730 Diverticulosis of large intestine without perforation or abscess without bleeding: Secondary | ICD-10-CM | POA: Insufficient documentation

## 2011-06-19 MED ORDER — IOHEXOL 300 MG/ML  SOLN: 100.0000 mL | Freq: Once | INTRAMUSCULAR | Status: AC | PRN

## 2011-06-19 NOTE — Telephone Encounter (Signed)
Donald Vaughn called asking if I would change the CT A/P order that Dr Dwain Sarna put in on the pt b/c it needed to be CT A/P w/contrast only./ [facility]

## 2011-06-20 ENCOUNTER — Telehealth (INDEPENDENT_AMBULATORY_CARE_PROVIDER_SITE_OTHER): Payer: Self-pay

## 2011-06-20 NOTE — Telephone Encounter (Signed)
Pt notified CT results were normal per Dr Dwain Sarna. Pt made follow up appt with Dr Dwain Sarna 07-16-11.Hulda Humphrey

## 2011-07-16 ENCOUNTER — Ambulatory Visit (INDEPENDENT_AMBULATORY_CARE_PROVIDER_SITE_OTHER): Payer: BC Managed Care – PPO | Admitting: General Surgery

## 2011-07-16 ENCOUNTER — Encounter (INDEPENDENT_AMBULATORY_CARE_PROVIDER_SITE_OTHER): Payer: Self-pay | Admitting: General Surgery

## 2011-07-16 VITALS — BP 132/86 | HR 64 | Temp 97.6°F | Resp 18 | Ht 68.0 in | Wt 211.5 lb

## 2011-07-16 DIAGNOSIS — R1012 Left upper quadrant pain: Secondary | ICD-10-CM

## 2011-07-16 NOTE — Progress Notes (Signed)
Subjective:     Patient ID: Donald Vaughn, male   DOB: 1951-07-03, 60 y.o.   MRN: [id number]  HPI This is a 60 year old male I know from umbilical hernia repair previously. He has had left upper quadrant pain that really could not find a source. He's had a scope there has been negative. We obtained a CT scan which is essentially normal showing no source of his pain. The scan does show a small area around his umbilicus but clinically I think this is just from his prior surgery I do not think he has recurrent hernia.   Review of Systems    CT ABDOMEN AND PELVIS WITH CONTRAST  Technique: Multidetector CT imaging of the abdomen and pelvis was  performed following the standard protocol during bolus  administration of intravenous contrast.  Contrast: 100 ml of omni 300  Comparison: 07/06/2006  Findings: The lung bases are clear.  No pericardial or pleural effusion.  There is no focal liver abnormalities identified.  The spleen appears normal.  The adrenal glands are normal.  Normal appearance of the pancreas.  The right kidney is normal.  Normal appearance of the left kidney.  There is no enlarged upper abdominal lymph nodes.  No enlarged pelvic or inguinal adenopathy.  The urinary bladder appears within normal limits.  The stomach and the small bowel loops are unremarkable. The  proximal colon is normal.  There are multiple colonic diverticula identified.  Status post mesh repair of umbilical hernia. There is a small  amount of fat which herniates beyond the margin of the mesh  measuring approximately 2.9 cm, image 95 of sagittal series 6.  Several metallic BBs were placed on the skin overlying the left  upper quadrant of the abdomen indicating the area of concern. The  underlying ventral abdominal wall in this area is intact without  evidence for hernia.  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 Most  severe at the L5-S1 level.  IMPRESSION:  1. There is no  evidence for hernia within the left upper quadrant  ventral abdominal wall in the area of concern.  2. Status post mesh repair of umbilical hernia with a small amount  of fat protruding beyond the margin of the hernia mesh.  Objective:   Physical Exam     Assessment:     LUQ abdominal pain    Plan:     I don't think he has any surgical source of his left upper quadrant pain. Although I cannot tell him why he hurt right now. I don't think again he has a recurrent umbilical hernia gallbladder. He is to follow up with his primary care physician come back to see me as needed.

## 2011-08-01 ENCOUNTER — Encounter (INDEPENDENT_AMBULATORY_CARE_PROVIDER_SITE_OTHER): Payer: Self-pay | Admitting: Neurology

## 2011-12-10 ENCOUNTER — Encounter (HOSPITAL_COMMUNITY): Payer: Self-pay | Admitting: Family Medicine

## 2011-12-10 ENCOUNTER — Emergency Department (HOSPITAL_COMMUNITY)
Admission: EM | Admit: 2011-12-10 | Discharge: 2011-12-10 | Disposition: A | Discharge status: Home / Self Care | Payer: Worker's Compensation | Attending: Emergency Medicine | Admitting: Emergency Medicine

## 2011-12-10 DIAGNOSIS — A779 Spotted fever, unspecified: Secondary | ICD-10-CM | POA: Insufficient documentation

## 2011-12-10 DIAGNOSIS — R112 Nausea with vomiting, unspecified: Secondary | ICD-10-CM | POA: Insufficient documentation

## 2011-12-10 DIAGNOSIS — R51 Headache: Secondary | ICD-10-CM | POA: Insufficient documentation

## 2011-12-10 DIAGNOSIS — A770 Spotted fever due to Rickettsia rickettsii: Secondary | ICD-10-CM

## 2011-12-10 DIAGNOSIS — R509 Fever, unspecified: Secondary | ICD-10-CM | POA: Insufficient documentation

## 2011-12-10 LAB — DIFFERENTIAL
Basophils Absolute: 0 10*3/uL (ref 0.0–0.1)
Basophils Relative: 1 % (ref 0–1)
Eosinophils Absolute: 0 10*3/uL (ref 0.0–0.7)
Lymphs Abs: 0.9 10*3/uL (ref 0.7–4.0)
Monocytes Absolute: 0.3 10*3/uL (ref 0.1–1.0)
Neutrophils Relative %: 44 % (ref 43–77)

## 2011-12-10 LAB — COMPREHENSIVE METABOLIC PANEL
Albumin: 4.1 g/dL (ref 3.5–5.2)
Alkaline Phosphatase: 123 U/L — ABNORMAL HIGH (ref 39–117)
CO2: 29 mEq/L (ref 19–32)
Calcium: 9 mg/dL (ref 8.4–10.5)
Chloride: 98 mEq/L (ref 96–112)
Creatinine, Ser: 1.09 mg/dL (ref 0.50–1.35)
GFR calc Af Amer: 83 mL/min — ABNORMAL LOW (ref >90)
Total Bilirubin: 0.6 mg/dL (ref 0.3–1.2)

## 2011-12-10 LAB — CBC
HCT: 50 % (ref 39.0–52.0)
MCHC: 36.2 g/dL — ABNORMAL HIGH (ref 30.0–36.0)
MCV: 90.3 fL (ref 78.0–100.0)
Platelets: 111 10*3/uL — ABNORMAL LOW (ref 150–400)
WBC: 2.2 10*3/uL — ABNORMAL LOW (ref 4.0–10.5)

## 2011-12-10 MED ORDER — ONDANSETRON 8 MG PO TBDP: 8.0000 mg | ORAL_TABLET | ORAL | Status: AC

## 2011-12-10 MED ORDER — ONDANSETRON 4 MG PO TBDP
8.0000 mg | ORAL_TABLET | Freq: Once | ORAL | Status: AC
  Administered 2011-12-10: 8 mg via ORAL
  Filled 2011-12-10: qty 2

## 2011-12-10 NOTE — ED Provider Notes (Signed)
History     CSN: [id number]  Arrival date & time 12/10/11  4098   First MD Initiated Contact with Patient 12/10/11 1019      Chief Complaint  Patient presents with  . Fever  . Emesis    (Consider location/radiation/quality/duration/timing/severity/associated sxs/prior treatment) HPI Comments: Over the last 2 weeks patient has been working outside were used multiple tics on his body. He has poor over 8-10 tics off of his body in today found one that was engorged with blood. Over the last 5 days he's been having headache, chills, joint pain and was seen by urgent care on Monday and prescribed doxycycline. However since starting the doxycycline 30 minutes after taking the medication he gets extreme nausea and vomiting. He states the joint aches, headache and chills are improving but the doxycycline is making his GI symptoms worse.  Patient is a 61 y.o. male presenting with fever and vomiting. The history is provided by the patient.  Fever Primary symptoms of the febrile illness include headaches, nausea and vomiting. Primary symptoms do not include fever, visual change, cough or diarrhea. The current episode started 3 to 5 days ago. This is a new problem. The problem has been gradually worsening.  The headache is not associated with visual change.   Nausea began 2 days ago (The nausea started after taking doxycycline). The nausea is associated with prescription drugs.  The vomiting began yesterday. Vomiting occurs 2 to 5 times per day. The emesis contains stomach contents.  The onset of the illness is associated with recent antibiotic use.  Emesis  Associated symptoms include headaches. Pertinent negatives include no cough, no diarrhea and no fever.    Past Medical History  Diagnosis Date  . Alcohol abuse     hx  . Chest pain, atypical   . Hypercholesterolemia   . Benign prostatic hypertrophy     hx  . GERD (gastroesophageal reflux disease)     with history of esophagitis  . Skin  cancer   . Arthritis   . Rectal bleeding   . Generalized headaches   . Hearing loss     Past Surgical History  Procedure Date  . Colonoscopy 2/03    norma. (Dr. Virginia Rochester)  . Egd- normal 2/03    Dr. Virginia Rochester.  . R shoulder surgery 4/03    Dr. Thurston Hole  . Back surgery     L4/5 Dr Eligha Bridegroom  . Left foot surgery   . Hernia repair     umbilical with PVP    Family History  Problem Relation Age of Onset  . Lymphoma Mother   . Hypertension Mother   . Hyperlipidemia Mother   . Diabetes Mother   . Alzheimer's disease Father     History  Substance Use Topics  . Smoking status: Never Smoker   . Smokeless tobacco: Never Used  . Alcohol Use: No      Review of Systems  Constitutional: Negative for fever.  Respiratory: Negative for cough.   Gastrointestinal: Positive for nausea and vomiting. Negative for diarrhea.  Neurological: Positive for headaches.  All other systems reviewed and are negative.    Allergies  Codeine; Oxycodone-aspirin; and Sulfonamide derivatives  Home Medications   Current Outpatient Rx  Name Route Sig Dispense Refill  . ASPIRIN-ACETAMINOPHEN-CAFFEINE 260-130-16 MG PO TABS Oral Take 1 tablet by mouth daily as needed. For pain    . DOXYCYCLINE HYCLATE 100 MG PO TABS Oral Take 100 mg by mouth 2 (two) times daily. For 21 days  Starting 12-08-11    . LUTEIN 6 MG PO CAPS Oral Take by mouth daily.      Marland Kitchen FISH OIL 1000 MG PO CAPS Oral Take by mouth daily as needed.        BP 135/83  Pulse 77  Temp(Src) 98.2 F (36.8 C) (Oral)  Resp 20  SpO2 96%  Physical Exam  Nursing note and vitals reviewed. Constitutional: He is oriented to person, place, and time. He appears well-developed and well-nourished. No distress.  HENT:  Head: Normocephalic and atraumatic.  Mouth/Throat: Oropharynx is clear and moist.  Eyes: Conjunctivae and EOM are normal. Pupils are equal, round, and reactive to light.  Neck: Normal range of motion. Neck supple.  Cardiovascular: Normal rate,  regular rhythm and intact distal pulses.   No murmur heard. Pulmonary/Chest: Effort normal and breath sounds normal. No respiratory distress. He has no wheezes. He has no rales.  Abdominal: Soft. He exhibits no distension. There is no tenderness. There is no rebound and no guarding.  Musculoskeletal: Normal range of motion. He exhibits no edema and no tenderness.  Neurological: He is alert and oriented to person, place, and time.  Skin: Skin is warm and dry. No rash noted. No erythema.  Psychiatric: He has a normal mood and affect. His behavior is normal.    ED Course  Procedures (including critical care time)  Labs Reviewed  CBC - Abnormal; Notable for the following:    WBC 2.2 (*)    Hemoglobin 18.1 (*)    MCHC 36.2 (*)    Platelets 111 (*) PLATELET COUNT CONFIRMED BY SMEAR   All other components within normal limits  DIFFERENTIAL - Abnormal; Notable for the following:    Neutro Abs 1.0 (*)    All other components within normal limits  COMPREHENSIVE METABOLIC PANEL - Abnormal; Notable for the following:    Glucose, Bld 112 (*)    AST 53 (*) SLIGHT HEMOLYSIS   Alkaline Phosphatase 123 (*)    GFR calc non Af Amer 71 (*)    GFR calc Af Amer 83 (*)    All other components within normal limits  ROCKY MTN SPOTTED FVR AB, IGM-BLOOD  ROCKY MTN SPOTTED FVR AB, IGG-BLOOD   No results found.   1. [facility] spotted fever       MDM   Patient is here due to symptoms concerning for [facility] spotted fever after having multiple tick bites over the last 2 weeks. He started on doxycycline on Monday and his headache, and joint pain and intermittent chills is improving however he is having adverse reactions from the doxycycline with severe nausea and vomiting after taking the medication. Discussed with patient's the importance of taking the doxycycline. We'll give Zofran to see if that improves his nausea and he is able to tolerate the antibiotic. [facility] spotted fever  titers sent and CBC, BMP pending. Patient is otherwise well appearing and has no signs of meningitis.  Labs are consistent with [facility] spotted fever with a red blood cell count of 2.2 and thrombocytopenia with a platelet count of 111 , sodium. Patient does appear to be moderately dehydrated with concentrated hemoglobin and mild elevation of LFTs.  12:41 PM On reevaluation after Zofran patient's nausea is well controlled he took his antibiotic and is feeling much better.        Gwyneth Sprout, MD 12/10/11 1313

## 2011-12-10 NOTE — ED Notes (Signed)
Pt found multiple ticks on body and removed Friday night after getting home from work. 8 ticks total. Pt currently taking abx and sts the medicaiton is making him sick. Pt reports weakness, HA, since the bites.

## 2011-12-10 NOTE — Discharge Instructions (Signed)
[place] Spotted Fever °[place] Spotted Fever (RMSF) is the oldest known tick-borne disease of people in the [place]. This disease was named because it was first described among people in the [place] area who had an illness characterized by a rash with red-purple-black spots. This disease is caused by a rickettsia (Rickettsia rickettsii), a bacteria carried by the tick. °The [place] wood tick and the [place] dog tick, acquire and transmit the RMSF bacteria (pictures NOT actual size). When a larval, nymphal or adult tick feeds on an infected rodent or larger animal, the tick can become infected. Infected adult ticks then feed on people who may then get RMSF. The tick transmits the disease to humans during a prolonged period of feeding that lasts many hours, days or even a couple weeks. The bite is painless and frequently goes unnoticed. An infected male tick may also pass the rickettsial bacteria to her eggs that then may mature to be infected adult ticks. °The rickettsia that causes RMSF can also get into a person's body through damaged skin. A tick bite is not necessary. People can get RMSF if they crush a tick and get it's blood or body fluids on their skin through a small cut or sore.  °DIAGNOSIS °Diagnosis is made by laboratory tests.  °TREATMENT °Treatment is with antibiotics (medications that kill rickettsia and other bacteria). Immediate treatment usually prevents death. °GEOGRAPHIC RANGE °This disease was reported only in the [place] until 1931. RMSF has more recently been described among individuals in all states except [place], [place] and [place]. The highest reported incidences of RMSF now occur among residents of [place], [place], [place] and the [place]. °TIME OF YEAR  °Most cases are diagnosed during late spring and summer when ticks are most active. However, especially in the warmer southern states, a few cases occur during the winter. °SYMPTOMS   °· Symptoms of RMSF begin from 2 to 14 days after a tick bite. The most common early symptoms are fever, muscle aches and headache followed by nausea (feeling sick to your stomach) or vomiting.  °· The RMSF rash is typically delayed until 3 or more days after symptom onset, and eventually develops in 9 of 10 infected patients by the 5th day of illness.  °If the disease is not treated it can cause death. If you get a fever, headache, muscle aches, rash, nausea or vomiting within 2 weeks of a possible tick bite or exposure you should see your caregiver immediately. °PREVENTION °Ticks prefer to hide in shady, moist ground litter. They can often be found above the ground clinging to tall grass, brush, shrubs and low tree branches. They also inhabit lawns and gardens, especially at the edges of woodlands and around old stone walls. Within the areas where ticks generally live, no naturally vegetated area can be considered completely free of infected ticks. The best precaution against RMSF is to avoid contact with soil, leaf litter and vegetation as much as possible in tick infested areas. For those who enjoy gardening or walking in their yards, clear brush and mow tall grass around houses and at the edges of gardens. This may help reduce the tick population in the immediate area. Applications of chemical insecticides by a licensed professional in the spring (late May) and Fall (September) will also control ticks, especially in heavily infested areas. Treatment will never get rid of all the ticks. Getting rid of small animal populations that host ticks will also decrease the tick population. When working in   the garden, pruning shrubs, or handling soil and vegetation, wear light-colored protective clothing and gloves. Spot-check often to prevent ticks from reaching the skin. Ticks cannot jump or fly. They will not drop from an above-ground perch onto a passing animal. Once a tick gains access to human skin it climbs upward  until it reaches a more protected area. For example, the back of the knee, groin, navel, armpit, ears or nape of the neck. It then begins the slow process of embedding itself in the skin. °Campers, hikers, field workers, and others who spend time in wooded, brushy or tall grassy areas can avoid exposure to ticks by using the following precautions: °· Wear light-colored clothing with a tight weave to spot ticks more easily and prevent contact with the skin.  °· Wear long pants tucked into socks, long-sleeved shirts tucked into pants and enclosed shoes or boots along with insect repellent.  °· Spray clothes with insect repellent containing either DEET or Permethrin. Only DEET can be used on exposed skin. Follow the manufacturer's directions carefully.  °· Wear a hat and keep long hair pulled back.  °· Stay on cleared, well-worn trails whenever possible.  °· Spot-check yourself and others often for the presence of ticks on clothes. If you find one, there are likely to be others. Check thoroughly.  °· Remove clothes after leaving tick-infested areas. If possible, wash them to eliminate any unseen ticks. Check yourself, your children and any pets from head to toe for the presence of ticks.  °· Shower and shampoo.  °You can greatly reduce your chances of contracting RMSF if you remove attached ticks as soon as possible. Regular checks of the body, including all body sites covered by hair (head, armpits, genitals), allow removal of the tick before rickettsial transmission. To remove an attached tick, use a forceps or tweezers to detach the intact tick without leaving mouth parts in the skin. The tick bite wound should be cleansed after tick removal. °Remember the most common symptoms of RMSF are fever, muscle aches, headache and nausea or vomiting with a later onset of rash. If you get these symptoms after a tick bite and while living in an area where RMSF is found, RMSF should be suspected. If the disease is not treated,  it can cause death. See your caregiver immediately if you get these symptoms. Do this even if not aware of a tick bite. °Document Released: 11/09/2000 Document Revised: 07/17/2011 Document Reviewed: 07/02/2009 °Exit[employer]® Patient Information ©2012 [employer]. °

## 2011-12-11 LAB — ROCKY MTN SPOTTED FVR AB, IGM-BLOOD: RMSF IgM: 0.33 IV (ref 0.00–0.89)

## 2011-12-12 NOTE — ED Notes (Signed)
Results received from [facility]. RMSF -> IgG = 1.05(H) & IgM(-) = 0.33. Pt given Abx Rx for Doxycycline 100 mg po BID x 21 days.  Chart to MD office for review.

## 2011-12-13 NOTE — ED Notes (Signed)
+   RMSF. Patient notified of positive result, states he is taking and tolerating doxycycline, some improvement. Has follow-up planned for Monday. Instructed to return to the ED if worse or needed.

## 2011-12-13 NOTE — ED Notes (Signed)
Chart returned from EDP office. Patient advised to continue current medication regimen for lymes disease coverage. Reviewed by Felicie Morn NPC.

## 2012-02-06 IMAGING — CT CT ABD-PELV W/ CM
2 of 5 series · 17 of 46 positions shown, 19 images · IV contrast (APPLIED)
Comparison: 07/06/2006

CLINICAL DATA: 60-year-old status post umbilical hernia repair

CT ABDOMEN AND PELVIS WITH CONTRAST
TECHNIQUE: Multidetector CT imaging of the abdomen and pelvis was
performed following the standard protocol during bolus
administration of intravenous contrast.
Contrast:  100 ml of omni 300

[Series 2: abd/pelv with 5.0 b31f st · axial · 0.74mm/px · z∈[-510,-100]mm · 14 of 92 slices shown, 16 images]
[im 5/92  soft-tissue]
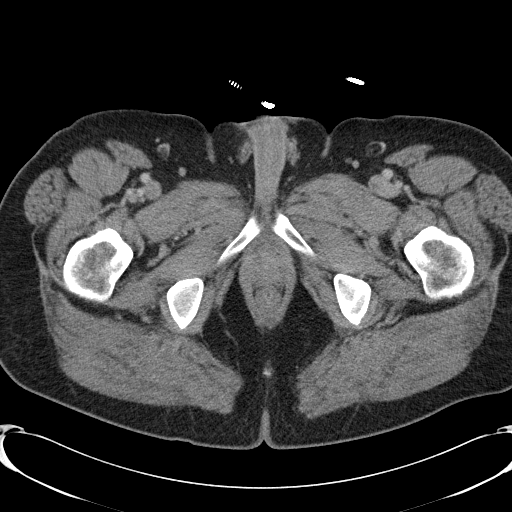
[im 5/92  bone]
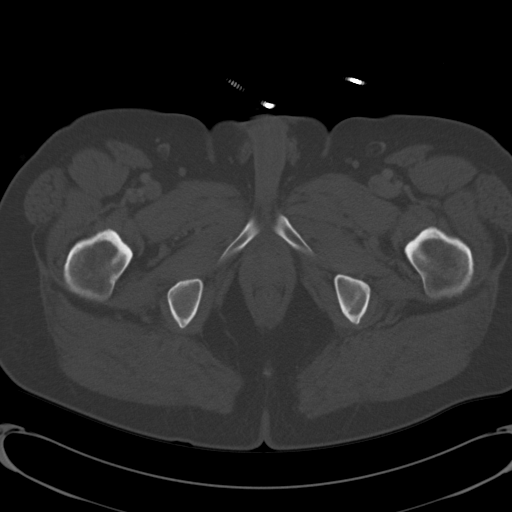
[im 10/92  soft-tissue]
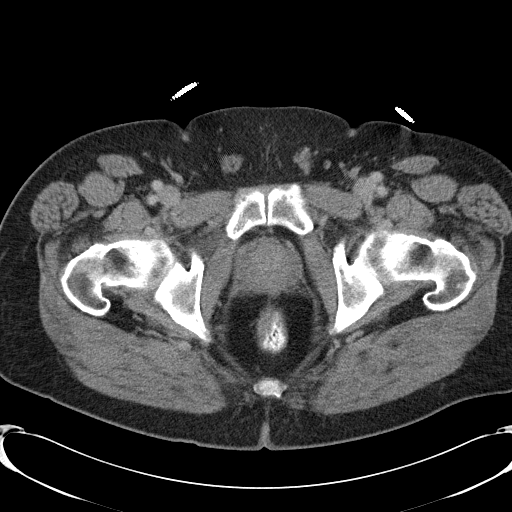
[im 20/92  soft-tissue]
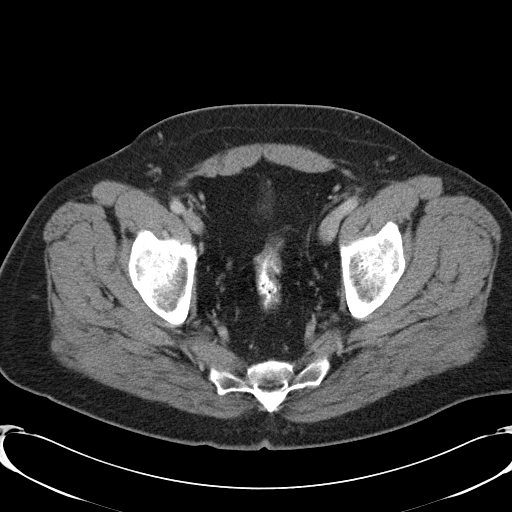
[im 24/92  soft-tissue]
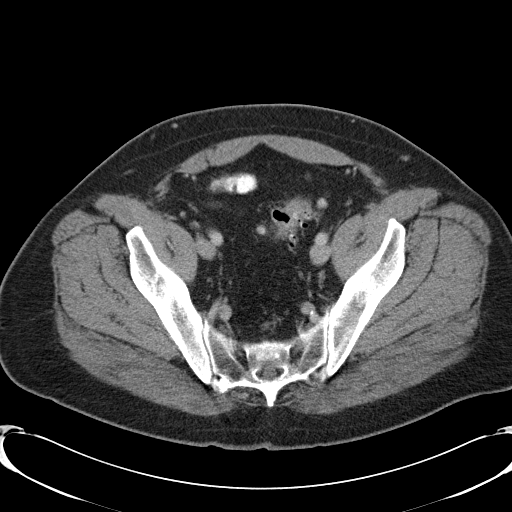
[im 29/92  soft-tissue]
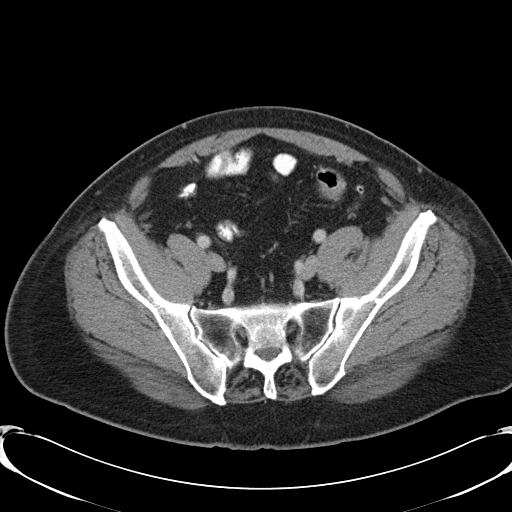
[im 39/92  soft-tissue]
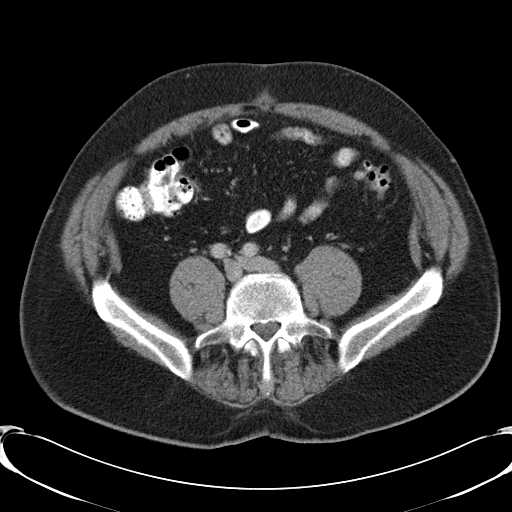
[im 44/92  soft-tissue]
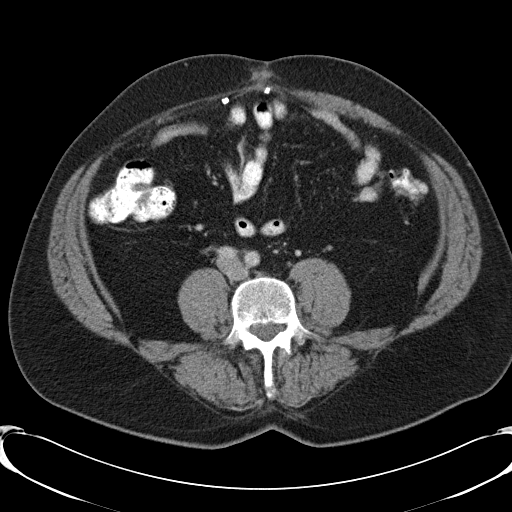
[im 48/92  soft-tissue]
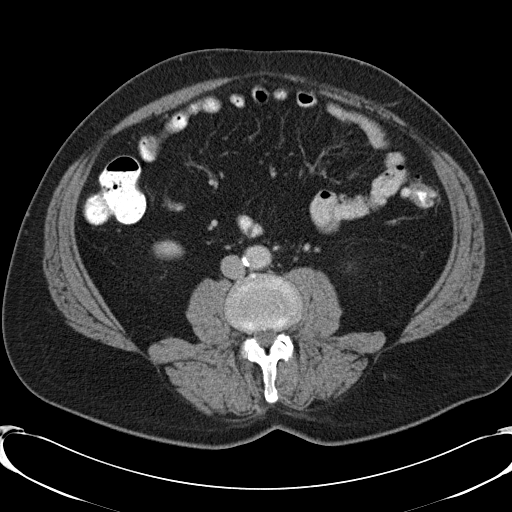
[im 53/92  soft-tissue]
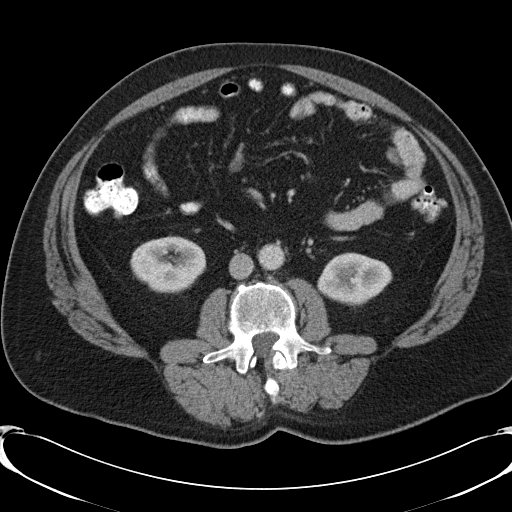
[im 53/92  bone]
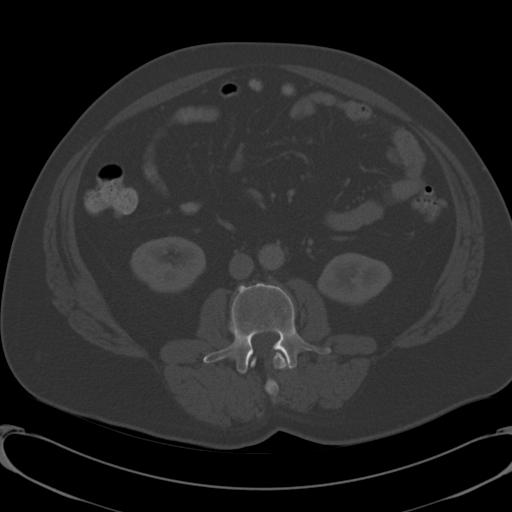
[im 63/92  soft-tissue]
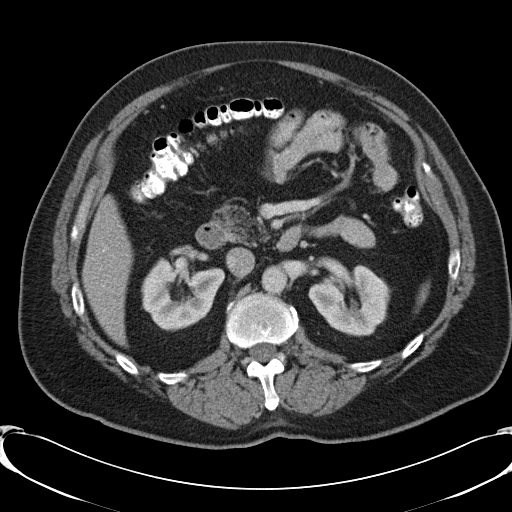
[im 68/92  soft-tissue]
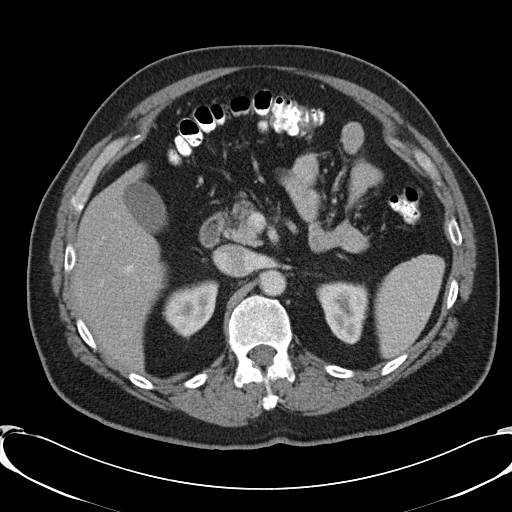
[im 72/92  soft-tissue]
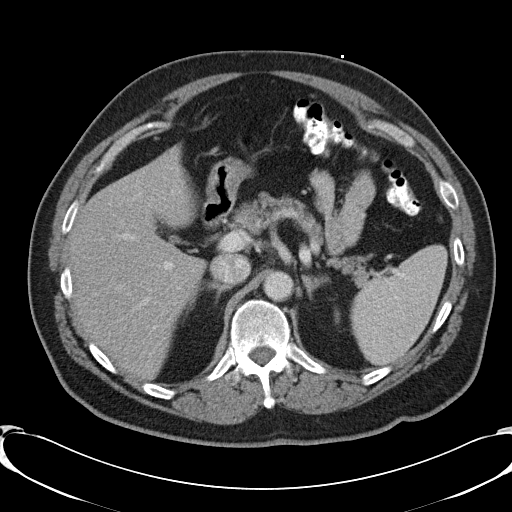
[im 82/92  soft-tissue]
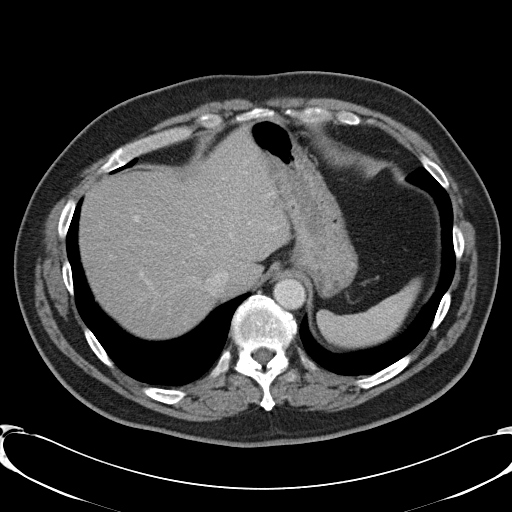
[im 87/92  soft-tissue]
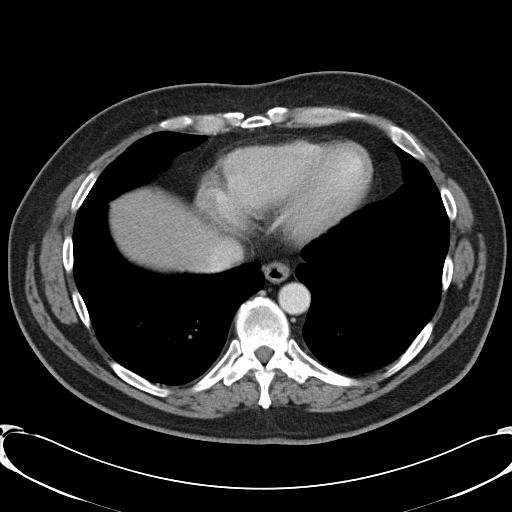

[Series 5: abd/pelv with 2.0 spo st · coronal · 0.87mm/px · 3 of 148 slices shown]
[im 50/148  soft-tissue]
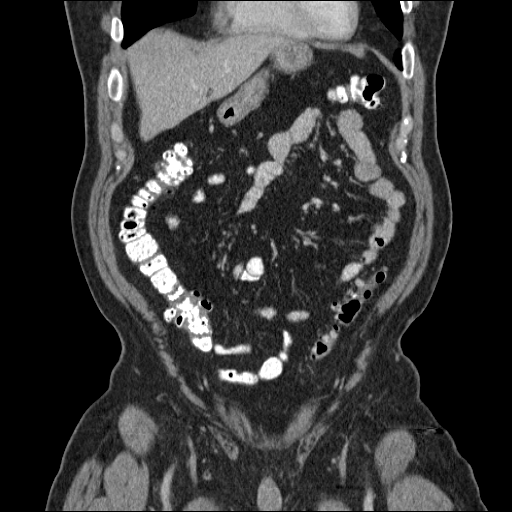
[im 66/148  soft-tissue]
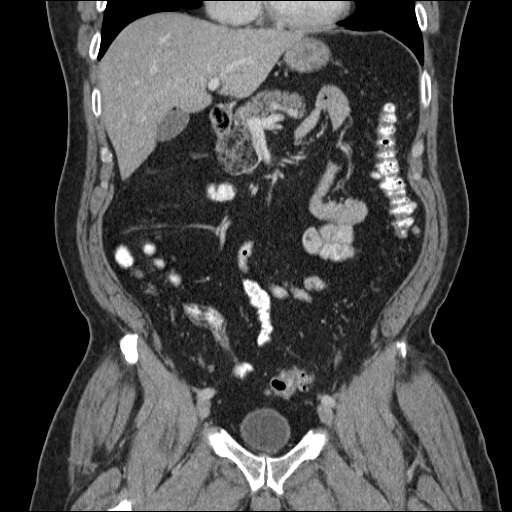
[im 82/148  soft-tissue]
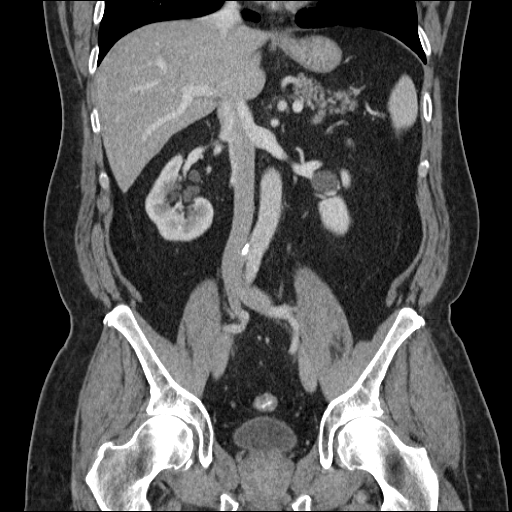

[17 of 46 positions shown; findings below may reference images not displayed]

FINDINGS: The lung bases are clear.

No pericardial or pleural effusion.

There is no focal liver abnormalities identified.

The spleen appears normal.

The adrenal glands are normal.

Normal appearance of the pancreas.

The right kidney is normal.

Normal appearance of the left kidney.

There is no enlarged upper abdominal lymph nodes.

No enlarged pelvic or inguinal adenopathy.

The urinary bladder appears within normal limits.

The stomach and the small bowel loops are unremarkable.  The
proximal colon is normal.

There are multiple colonic diverticula identified.

Status post mesh repair of umbilical hernia.  There is a small
amount of fat which herniates beyond the margin of the mesh
measuring approximately 2.9 cm, image  95 of sagittal series 6.

Several metallic BBs were placed on the skin overlying the left
upper quadrant of the abdomen indicating the area of concern.  The
underlying ventral abdominal wall in this area is intact without
evidence for hernia.

Degenerative disc disease is noted within the lumbar spine.  Most
severe at the L5-S1 level.
IMPRESSION: 1.  There is no evidence for hernia within the left upper quadrant
ventral abdominal wall in the area of concern.
2.  Status post mesh repair of umbilical hernia with a small amount
of fat protruding beyond the margin of the hernia mesh.

## 2012-03-25 ENCOUNTER — Encounter (INDEPENDENT_AMBULATORY_CARE_PROVIDER_SITE_OTHER): Payer: Self-pay | Admitting: General Surgery

## 2012-03-25 ENCOUNTER — Ambulatory Visit (INDEPENDENT_AMBULATORY_CARE_PROVIDER_SITE_OTHER): Payer: BC Managed Care – PPO | Admitting: General Surgery

## 2012-03-25 VITALS — BP 140/78 | HR 76 | Resp 18 | Ht 68.0 in | Wt 203.0 lb

## 2012-03-25 DIAGNOSIS — R109 Unspecified abdominal pain: Secondary | ICD-10-CM

## 2012-03-25 NOTE — Progress Notes (Signed)
Subjective:     Patient ID: [name], male   DOB: 12/30/1950, 61 y.o.   MRN: [id number]  HPI 16 yom who I know from [facility] with PVP several years ago.  I saw him recently for LUQ pain with no real source found but nothing surgical.  This Donald Vaughn now gone.  Since Tuesday he has noted LLQ pain that has been tender to touch and worse with changing positions.  He works with [employer].  He notes no fevers.  It does hurt when strains to have bm but no real change in bm.  Since Tuesday he has worn a belt for support and this feels better.  The burning in abdominal wall has improved.  He comes in today concerned for hernia.  He does not notice a bulge.   Review of Systems     Objective:   Physical Exam    I do not feel a llq hernia, he has some point tenderness that I think Donald Vaughn musculoskeletal in nature.  I do not think he has exam consistent with diverticulitis.  No umbilical or inguinal hernias are present.  Assessment:     LLQ ab wall strain    Plan:     I don't think he has hernia on exam or by history.  I also do not think this Donald Vaughn diverticular disease.  This has been getting better.  I told him to continue taking it easy, use nsaids, and support.I will see him back sooner if he Donald Vaughn worse or in a few weeks to make sure he Donald Vaughn better.

## 2012-04-16 ENCOUNTER — Encounter (INDEPENDENT_AMBULATORY_CARE_PROVIDER_SITE_OTHER): Payer: BC Managed Care – PPO | Admitting: General Surgery

## 2012-04-26 ENCOUNTER — Encounter (INDEPENDENT_AMBULATORY_CARE_PROVIDER_SITE_OTHER): Payer: BC Managed Care – PPO | Admitting: General Surgery

## 2014-07-20 ENCOUNTER — Ambulatory Visit (INDEPENDENT_AMBULATORY_CARE_PROVIDER_SITE_OTHER): Payer: BC Managed Care – PPO | Admitting: Neurology

## 2014-07-20 ENCOUNTER — Encounter: Payer: Self-pay | Admitting: Neurology

## 2014-07-20 VITALS — BP 126/75 | HR 71 | Temp 98.0°F | Ht 68.5 in | Wt 206.0 lb

## 2014-07-20 DIAGNOSIS — R51 Headache: Secondary | ICD-10-CM

## 2014-07-20 DIAGNOSIS — R202 Paresthesia of skin: Secondary | ICD-10-CM

## 2014-07-20 DIAGNOSIS — R519 Headache, unspecified: Secondary | ICD-10-CM

## 2014-07-20 DIAGNOSIS — W19XXXS Unspecified fall, sequela: Secondary | ICD-10-CM

## 2014-07-20 NOTE — Patient Instructions (Addendum)
Please remember, common headache triggers are: sleep deprivation, dehydration, overheating, stress, hypoglycemia or skipping meals and blood sugar fluctuations, excessive pain medications or excessive alcohol use or caffeine withdrawal. Some people have food triggers such as aged cheese, orange juice or chocolate, especially dark chocolate, or MSG (monosodium glutamate). Try to avoid these headache triggers as much possible. It may be helpful to keep a headache diary to figure out what makes your headaches worse or brings them on and what alleviates them. Some people report headache onset after exercise but studies have shown that regular exercise may actually prevent headaches from coming. If you have exercise-induced headaches, please make sure that you drink plenty of fluid before and after exercising and that you do not over do it and do not overheat.  I will do an MRI brain and can see you back as needed.   Please ask family and friends or your significant other or bed partner if you snore and if so, how loud it is, and if you have breathing related issues in your sleep, such as: snorting sounds, choking sounds, pauses in your breathing or shallow breathing events. These may be symptoms of obstructive sleep apnea (OSA).   Talk to Dr. Shelia Media about potentially doing a home sleep test.
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
## 2014-08-02 ENCOUNTER — Ambulatory Visit (INDEPENDENT_AMBULATORY_CARE_PROVIDER_SITE_OTHER): Payer: BC Managed Care – PPO

## 2014-08-02 DIAGNOSIS — R202 Paresthesia of skin: Secondary | ICD-10-CM

## 2014-08-02 DIAGNOSIS — R51 Headache: Secondary | ICD-10-CM

## 2014-08-02 DIAGNOSIS — W19XXXS Unspecified fall, sequela: Secondary | ICD-10-CM

## 2014-08-02 DIAGNOSIS — R519 Headache, unspecified: Secondary | ICD-10-CM

## 2014-08-02 MED ORDER — GADOPENTETATE DIMEGLUMINE 469.01 MG/ML IV SOLN: 20.0000 mL | Freq: Once | INTRAVENOUS | Status: AC | PRN

## 2014-08-07 NOTE — Progress Notes (Signed)
Quick Note:  Please call patient regarding the recent brain MRI: The brain scan showed a normal structure of the brain and no brain volume loss or what we call atrophy. There were changes in the deeper structures of the brain, which we call white matter changes or microvascular changes. These were reported as minimal in his case. These are tiny white spots, that occur with time and are seen in a variety of conditions, including with normal aging, chronic hypertension, chronic headaches, especially migraine HAs, chronic diabetes, chronic hyperlipidemia. These are not strokes and no mass or lesion or contrast enhancement was seen which is reassuring. Again, there were no acute findings, such as a stroke, or mass or blood products. No further action is required on this test at this time, other than re-enforcing the importance of good blood pressure control, good cholesterol control, good blood sugar control, and weight management. Please ask him to call us with any interim questions, concerns, problems or updates.  Please send copy of MRI report to Dr. Shelia Media as well,  Thanks,  Star Age, MD, PhD    ______

## 2014-08-09 ENCOUNTER — Encounter: Payer: Self-pay | Admitting: *Deleted

## 2014-08-09 ENCOUNTER — Telehealth: Payer: Self-pay | Admitting: Neurology

## 2014-08-09 NOTE — Telephone Encounter (Signed)
Faxed MRI brain results to Dr Katrine Coho for his records per Dr Guadelupe Sabin request,received confirmation of fax.

## 2015-01-29 DIAGNOSIS — N400 Enlarged prostate without lower urinary tract symptoms: Secondary | ICD-10-CM | POA: Diagnosis not present

## 2015-01-29 DIAGNOSIS — N419 Inflammatory disease of prostate, unspecified: Secondary | ICD-10-CM | POA: Diagnosis not present

## 2015-01-29 DIAGNOSIS — N3000 Acute cystitis without hematuria: Secondary | ICD-10-CM | POA: Diagnosis not present

## 2015-02-20 DIAGNOSIS — Z125 Encounter for screening for malignant neoplasm of prostate: Secondary | ICD-10-CM | POA: Diagnosis not present

## 2015-02-20 DIAGNOSIS — I10 Essential (primary) hypertension: Secondary | ICD-10-CM | POA: Diagnosis not present

## 2015-02-20 DIAGNOSIS — E780 Pure hypercholesterolemia: Secondary | ICD-10-CM | POA: Diagnosis not present

## 2015-02-26 ENCOUNTER — Other Ambulatory Visit: Payer: Self-pay | Admitting: Internal Medicine

## 2015-02-26 DIAGNOSIS — N183 Chronic kidney disease, stage 3 unspecified: Secondary | ICD-10-CM

## 2015-02-26 DIAGNOSIS — I10 Essential (primary) hypertension: Secondary | ICD-10-CM | POA: Diagnosis not present

## 2015-02-26 DIAGNOSIS — R51 Headache: Secondary | ICD-10-CM | POA: Diagnosis not present

## 2015-02-26 DIAGNOSIS — E780 Pure hypercholesterolemia: Secondary | ICD-10-CM | POA: Diagnosis not present

## 2015-03-01 ENCOUNTER — Ambulatory Visit
Admission: RE | Admit: 2015-03-01 | Discharge: 2015-03-01 | Disposition: A | Discharge status: Home / Self Care | Payer: Medicare Other | Source: Ambulatory Visit | Attending: Internal Medicine | Admitting: Internal Medicine

## 2015-03-01 DIAGNOSIS — N183 Chronic kidney disease, stage 3 unspecified: Secondary | ICD-10-CM

## 2015-03-01 DIAGNOSIS — I10 Essential (primary) hypertension: Secondary | ICD-10-CM | POA: Diagnosis not present

## 2015-03-01 DIAGNOSIS — N281 Cyst of kidney, acquired: Secondary | ICD-10-CM | POA: Diagnosis not present

## 2015-03-06 DIAGNOSIS — N281 Cyst of kidney, acquired: Secondary | ICD-10-CM | POA: Diagnosis not present

## 2015-03-06 DIAGNOSIS — I10 Essential (primary) hypertension: Secondary | ICD-10-CM | POA: Diagnosis not present

## 2015-03-06 DIAGNOSIS — D649 Anemia, unspecified: Secondary | ICD-10-CM | POA: Diagnosis not present

## 2015-03-06 DIAGNOSIS — Z23 Encounter for immunization: Secondary | ICD-10-CM | POA: Diagnosis not present

## 2015-03-06 DIAGNOSIS — N189 Chronic kidney disease, unspecified: Secondary | ICD-10-CM | POA: Diagnosis not present

## 2015-03-15 ENCOUNTER — Ambulatory Visit
Admission: RE | Admit: 2015-03-15 | Discharge: 2015-03-15 | Disposition: A | Discharge status: Home / Self Care | Payer: Medicare Other | Source: Ambulatory Visit | Attending: Internal Medicine | Admitting: Internal Medicine

## 2015-03-15 ENCOUNTER — Other Ambulatory Visit: Payer: Self-pay | Admitting: Internal Medicine

## 2015-03-15 DIAGNOSIS — N183 Chronic kidney disease, stage 3 (moderate): Secondary | ICD-10-CM | POA: Diagnosis not present

## 2015-03-15 DIAGNOSIS — M79661 Pain in right lower leg: Secondary | ICD-10-CM | POA: Diagnosis not present

## 2015-03-15 DIAGNOSIS — M7989 Other specified soft tissue disorders: Secondary | ICD-10-CM

## 2015-03-15 DIAGNOSIS — M79604 Pain in right leg: Secondary | ICD-10-CM

## 2015-03-15 DIAGNOSIS — I10 Essential (primary) hypertension: Secondary | ICD-10-CM | POA: Diagnosis not present

## 2015-03-15 DIAGNOSIS — R600 Localized edema: Secondary | ICD-10-CM | POA: Diagnosis not present

## 2015-03-22 DIAGNOSIS — R600 Localized edema: Secondary | ICD-10-CM | POA: Diagnosis not present

## 2015-03-22 DIAGNOSIS — I10 Essential (primary) hypertension: Secondary | ICD-10-CM | POA: Diagnosis not present

## 2015-03-22 DIAGNOSIS — N183 Chronic kidney disease, stage 3 (moderate): Secondary | ICD-10-CM | POA: Diagnosis not present

## 2015-03-22 DIAGNOSIS — N189 Chronic kidney disease, unspecified: Secondary | ICD-10-CM | POA: Diagnosis not present

## 2015-04-24 DIAGNOSIS — N183 Chronic kidney disease, stage 3 (moderate): Secondary | ICD-10-CM | POA: Diagnosis not present

## 2015-04-24 DIAGNOSIS — I10 Essential (primary) hypertension: Secondary | ICD-10-CM | POA: Diagnosis not present

## 2015-04-24 DIAGNOSIS — R609 Edema, unspecified: Secondary | ICD-10-CM | POA: Diagnosis not present

## 2015-05-01 DIAGNOSIS — N183 Chronic kidney disease, stage 3 (moderate): Secondary | ICD-10-CM | POA: Diagnosis not present

## 2015-05-01 DIAGNOSIS — R600 Localized edema: Secondary | ICD-10-CM | POA: Diagnosis not present

## 2015-05-01 DIAGNOSIS — I10 Essential (primary) hypertension: Secondary | ICD-10-CM | POA: Diagnosis not present

## 2015-06-11 DIAGNOSIS — M7542 Impingement syndrome of left shoulder: Secondary | ICD-10-CM | POA: Diagnosis not present

## 2015-06-12 DIAGNOSIS — Z23 Encounter for immunization: Secondary | ICD-10-CM | POA: Diagnosis not present

## 2015-06-17 DIAGNOSIS — M25512 Pain in left shoulder: Secondary | ICD-10-CM | POA: Diagnosis not present

## 2015-06-20 DIAGNOSIS — M7542 Impingement syndrome of left shoulder: Secondary | ICD-10-CM | POA: Diagnosis not present

## 2015-06-28 DIAGNOSIS — N183 Chronic kidney disease, stage 3 (moderate): Secondary | ICD-10-CM | POA: Diagnosis not present

## 2015-06-28 DIAGNOSIS — M75102 Unspecified rotator cuff tear or rupture of left shoulder, not specified as traumatic: Secondary | ICD-10-CM | POA: Diagnosis not present

## 2015-06-28 DIAGNOSIS — I129 Hypertensive chronic kidney disease with stage 1 through stage 4 chronic kidney disease, or unspecified chronic kidney disease: Secondary | ICD-10-CM | POA: Diagnosis not present

## 2015-06-28 DIAGNOSIS — R7303 Prediabetes: Secondary | ICD-10-CM | POA: Diagnosis not present

## 2015-06-28 DIAGNOSIS — E7800 Pure hypercholesterolemia, unspecified: Secondary | ICD-10-CM | POA: Diagnosis not present

## 2015-07-13 DIAGNOSIS — I129 Hypertensive chronic kidney disease with stage 1 through stage 4 chronic kidney disease, or unspecified chronic kidney disease: Secondary | ICD-10-CM | POA: Diagnosis not present

## 2015-07-13 DIAGNOSIS — N183 Chronic kidney disease, stage 3 (moderate): Secondary | ICD-10-CM | POA: Diagnosis not present

## 2015-07-19 DIAGNOSIS — M7542 Impingement syndrome of left shoulder: Secondary | ICD-10-CM | POA: Diagnosis not present

## 2015-07-19 DIAGNOSIS — M19012 Primary osteoarthritis, left shoulder: Secondary | ICD-10-CM | POA: Diagnosis not present

## 2015-07-19 DIAGNOSIS — G8918 Other acute postprocedural pain: Secondary | ICD-10-CM | POA: Diagnosis not present

## 2015-07-19 DIAGNOSIS — M24112 Other articular cartilage disorders, left shoulder: Secondary | ICD-10-CM | POA: Diagnosis not present

## 2015-07-19 DIAGNOSIS — M75122 Complete rotator cuff tear or rupture of left shoulder, not specified as traumatic: Secondary | ICD-10-CM | POA: Diagnosis not present

## 2015-07-19 DIAGNOSIS — S46012A Strain of muscle(s) and tendon(s) of the rotator cuff of left shoulder, initial encounter: Secondary | ICD-10-CM | POA: Diagnosis not present

## 2015-07-25 DIAGNOSIS — H2513 Age-related nuclear cataract, bilateral: Secondary | ICD-10-CM | POA: Diagnosis not present

## 2015-07-26 DIAGNOSIS — Z85828 Personal history of other malignant neoplasm of skin: Secondary | ICD-10-CM | POA: Diagnosis not present

## 2015-07-26 DIAGNOSIS — D1801 Hemangioma of skin and subcutaneous tissue: Secondary | ICD-10-CM | POA: Diagnosis not present

## 2015-07-26 DIAGNOSIS — L821 Other seborrheic keratosis: Secondary | ICD-10-CM | POA: Diagnosis not present

## 2015-07-27 DIAGNOSIS — R7989 Other specified abnormal findings of blood chemistry: Secondary | ICD-10-CM | POA: Diagnosis not present

## 2015-08-01 DIAGNOSIS — M75122 Complete rotator cuff tear or rupture of left shoulder, not specified as traumatic: Secondary | ICD-10-CM | POA: Diagnosis not present

## 2015-08-01 DIAGNOSIS — M7542 Impingement syndrome of left shoulder: Secondary | ICD-10-CM | POA: Diagnosis not present

## 2015-08-01 DIAGNOSIS — M19012 Primary osteoarthritis, left shoulder: Secondary | ICD-10-CM | POA: Diagnosis not present

## 2015-08-14 DIAGNOSIS — N183 Chronic kidney disease, stage 3 (moderate): Secondary | ICD-10-CM | POA: Diagnosis not present

## 2015-08-29 DIAGNOSIS — M75122 Complete rotator cuff tear or rupture of left shoulder, not specified as traumatic: Secondary | ICD-10-CM | POA: Diagnosis not present

## 2015-08-30 DIAGNOSIS — M25612 Stiffness of left shoulder, not elsewhere classified: Secondary | ICD-10-CM | POA: Diagnosis not present

## 2015-08-30 DIAGNOSIS — M75122 Complete rotator cuff tear or rupture of left shoulder, not specified as traumatic: Secondary | ICD-10-CM | POA: Diagnosis not present

## 2015-08-30 DIAGNOSIS — M25512 Pain in left shoulder: Secondary | ICD-10-CM | POA: Diagnosis not present

## 2015-09-03 DIAGNOSIS — M75122 Complete rotator cuff tear or rupture of left shoulder, not specified as traumatic: Secondary | ICD-10-CM | POA: Diagnosis not present

## 2015-09-03 DIAGNOSIS — M25512 Pain in left shoulder: Secondary | ICD-10-CM | POA: Diagnosis not present

## 2015-09-03 DIAGNOSIS — M25612 Stiffness of left shoulder, not elsewhere classified: Secondary | ICD-10-CM | POA: Diagnosis not present

## 2015-09-06 DIAGNOSIS — M25512 Pain in left shoulder: Secondary | ICD-10-CM | POA: Diagnosis not present

## 2015-09-06 DIAGNOSIS — M25612 Stiffness of left shoulder, not elsewhere classified: Secondary | ICD-10-CM | POA: Diagnosis not present

## 2015-09-06 DIAGNOSIS — M75122 Complete rotator cuff tear or rupture of left shoulder, not specified as traumatic: Secondary | ICD-10-CM | POA: Diagnosis not present

## 2015-09-10 DIAGNOSIS — M25512 Pain in left shoulder: Secondary | ICD-10-CM | POA: Diagnosis not present

## 2015-09-10 DIAGNOSIS — M75122 Complete rotator cuff tear or rupture of left shoulder, not specified as traumatic: Secondary | ICD-10-CM | POA: Diagnosis not present

## 2015-09-10 DIAGNOSIS — M25612 Stiffness of left shoulder, not elsewhere classified: Secondary | ICD-10-CM | POA: Diagnosis not present

## 2015-09-13 DIAGNOSIS — M25512 Pain in left shoulder: Secondary | ICD-10-CM | POA: Diagnosis not present

## 2015-09-13 DIAGNOSIS — M75122 Complete rotator cuff tear or rupture of left shoulder, not specified as traumatic: Secondary | ICD-10-CM | POA: Diagnosis not present

## 2015-09-13 DIAGNOSIS — M25612 Stiffness of left shoulder, not elsewhere classified: Secondary | ICD-10-CM | POA: Diagnosis not present

## 2015-09-19 DIAGNOSIS — M75122 Complete rotator cuff tear or rupture of left shoulder, not specified as traumatic: Secondary | ICD-10-CM | POA: Diagnosis not present

## 2015-09-19 DIAGNOSIS — M25512 Pain in left shoulder: Secondary | ICD-10-CM | POA: Diagnosis not present

## 2015-09-19 DIAGNOSIS — M25612 Stiffness of left shoulder, not elsewhere classified: Secondary | ICD-10-CM | POA: Diagnosis not present

## 2015-09-24 DIAGNOSIS — M75122 Complete rotator cuff tear or rupture of left shoulder, not specified as traumatic: Secondary | ICD-10-CM | POA: Diagnosis not present

## 2015-09-24 DIAGNOSIS — M25612 Stiffness of left shoulder, not elsewhere classified: Secondary | ICD-10-CM | POA: Diagnosis not present

## 2015-09-24 DIAGNOSIS — M25512 Pain in left shoulder: Secondary | ICD-10-CM | POA: Diagnosis not present

## 2015-09-26 DIAGNOSIS — M75122 Complete rotator cuff tear or rupture of left shoulder, not specified as traumatic: Secondary | ICD-10-CM | POA: Diagnosis not present

## 2015-09-27 DIAGNOSIS — M75122 Complete rotator cuff tear or rupture of left shoulder, not specified as traumatic: Secondary | ICD-10-CM | POA: Diagnosis not present

## 2015-09-27 DIAGNOSIS — M25612 Stiffness of left shoulder, not elsewhere classified: Secondary | ICD-10-CM | POA: Diagnosis not present

## 2015-09-27 DIAGNOSIS — M25512 Pain in left shoulder: Secondary | ICD-10-CM | POA: Diagnosis not present

## 2015-10-02 ENCOUNTER — Ambulatory Visit
Admission: RE | Admit: 2015-10-02 | Discharge: 2015-10-02 | Disposition: A | Discharge status: Home / Self Care | Payer: Medicare Other | Source: Ambulatory Visit | Attending: Nephrology | Admitting: Nephrology

## 2015-10-02 ENCOUNTER — Other Ambulatory Visit: Payer: Self-pay | Admitting: Nephrology

## 2015-10-02 DIAGNOSIS — M25512 Pain in left shoulder: Secondary | ICD-10-CM | POA: Diagnosis not present

## 2015-10-02 DIAGNOSIS — N183 Chronic kidney disease, stage 3 unspecified: Secondary | ICD-10-CM

## 2015-10-02 DIAGNOSIS — M545 Low back pain: Secondary | ICD-10-CM

## 2015-10-02 DIAGNOSIS — M25612 Stiffness of left shoulder, not elsewhere classified: Secondary | ICD-10-CM | POA: Diagnosis not present

## 2015-10-02 DIAGNOSIS — M75122 Complete rotator cuff tear or rupture of left shoulder, not specified as traumatic: Secondary | ICD-10-CM | POA: Diagnosis not present

## 2015-10-04 DIAGNOSIS — M25612 Stiffness of left shoulder, not elsewhere classified: Secondary | ICD-10-CM | POA: Diagnosis not present

## 2015-10-04 DIAGNOSIS — M25512 Pain in left shoulder: Secondary | ICD-10-CM | POA: Diagnosis not present

## 2015-10-04 DIAGNOSIS — M75122 Complete rotator cuff tear or rupture of left shoulder, not specified as traumatic: Secondary | ICD-10-CM | POA: Diagnosis not present

## 2015-10-10 DIAGNOSIS — M75122 Complete rotator cuff tear or rupture of left shoulder, not specified as traumatic: Secondary | ICD-10-CM | POA: Diagnosis not present

## 2015-10-10 DIAGNOSIS — M25512 Pain in left shoulder: Secondary | ICD-10-CM | POA: Diagnosis not present

## 2015-10-10 DIAGNOSIS — M25612 Stiffness of left shoulder, not elsewhere classified: Secondary | ICD-10-CM | POA: Diagnosis not present

## 2015-10-17 DIAGNOSIS — M25512 Pain in left shoulder: Secondary | ICD-10-CM | POA: Diagnosis not present

## 2015-10-17 DIAGNOSIS — M75122 Complete rotator cuff tear or rupture of left shoulder, not specified as traumatic: Secondary | ICD-10-CM | POA: Diagnosis not present

## 2015-10-17 DIAGNOSIS — M25612 Stiffness of left shoulder, not elsewhere classified: Secondary | ICD-10-CM | POA: Diagnosis not present

## 2015-10-19 IMAGING — US US RENAL
1 series · 14 of 25 positions shown · non-contrast
Comparison: Abdominal CT scan June 19, 2011

CLINICAL DATA: Chronic renal insufficiency stage III, hypertension.

EXAM:
RENAL / URINARY TRACT ULTRASOUND COMPLETE

[Series 1: us renal · 0.25mm/px · 14 of 34 slices shown]
[im 1/34]
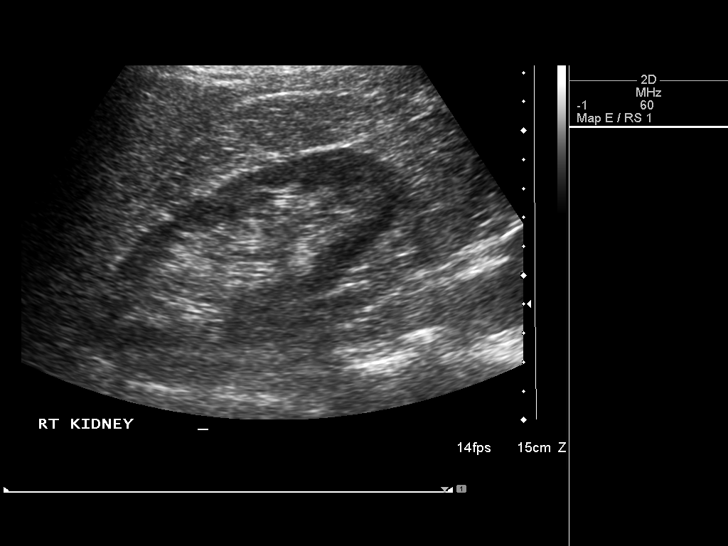
[im 3/34]
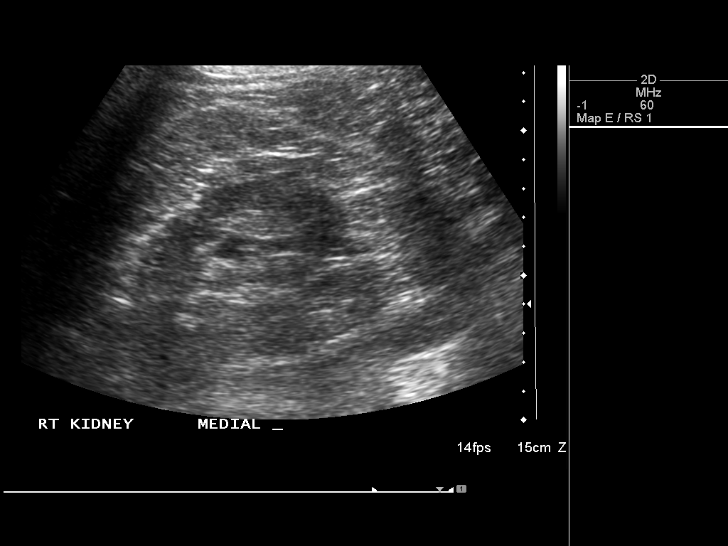
[im 6/34]
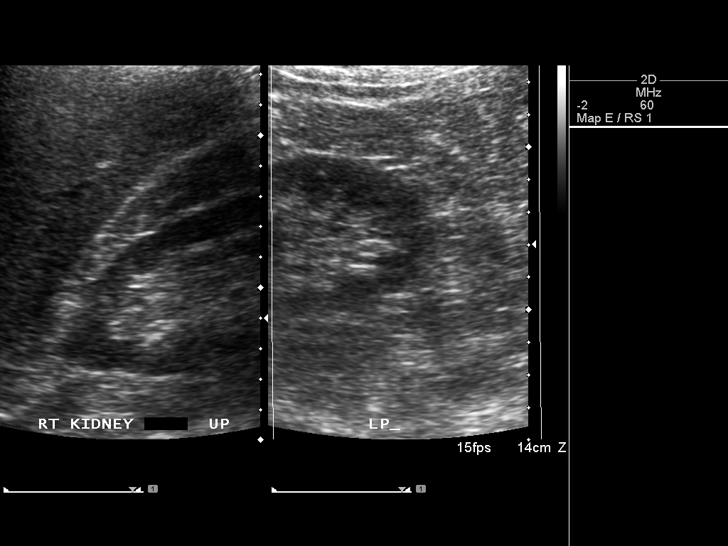
[im 9/34]
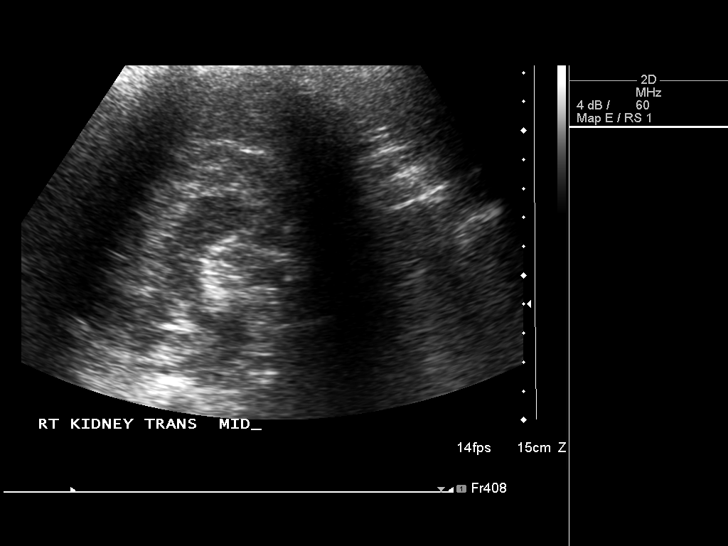
[im 12/34]
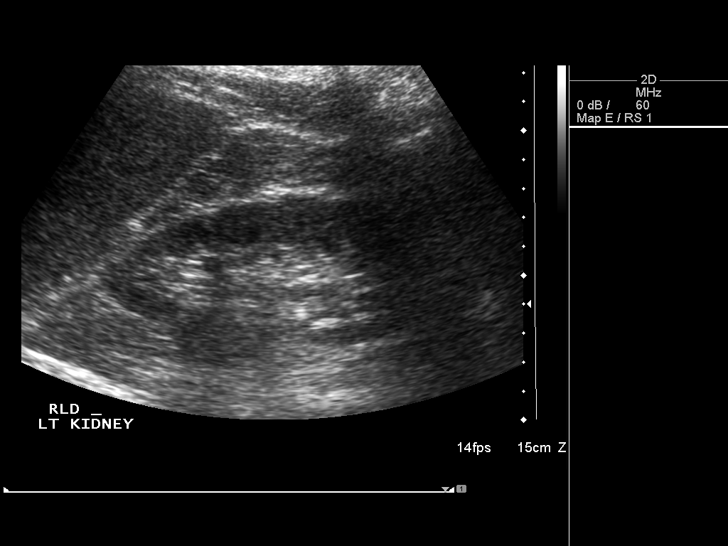
[im 13/34]
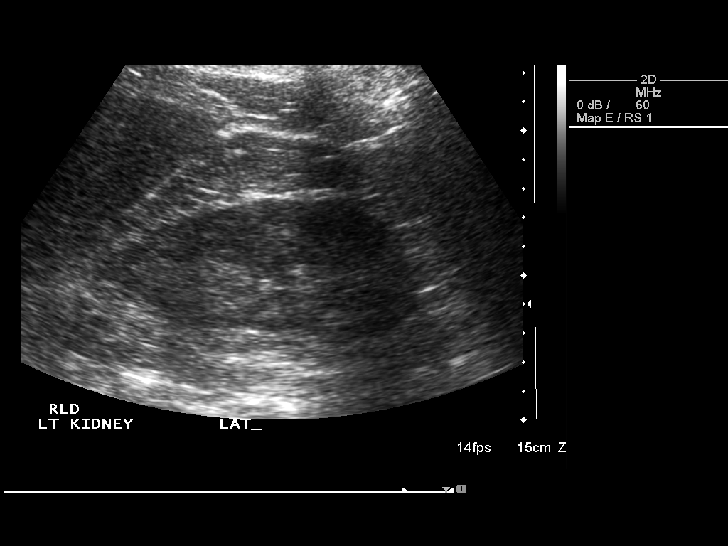
[im 16/34]
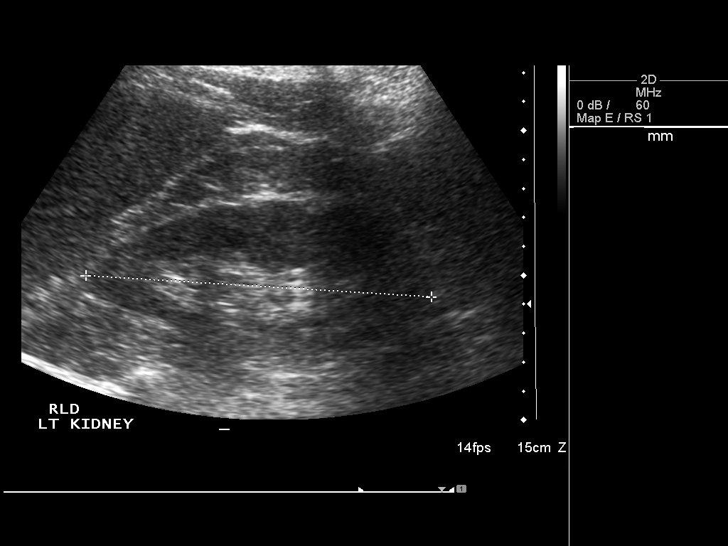
[im 18/34]
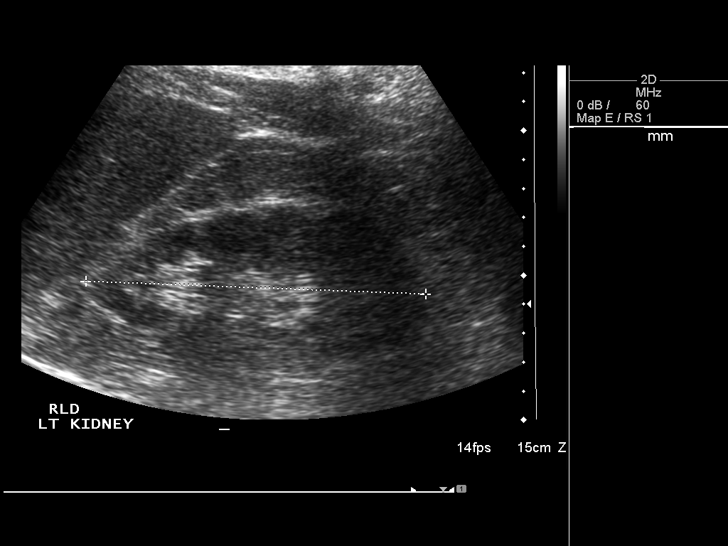
[im 21/34]
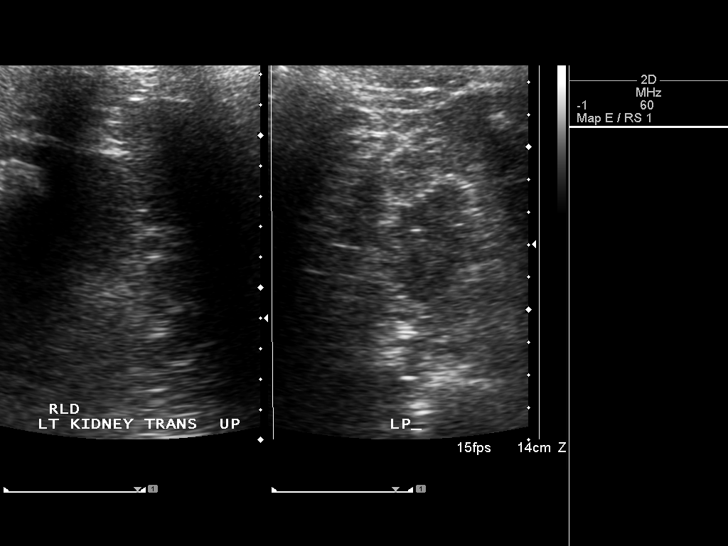
[im 23/34]
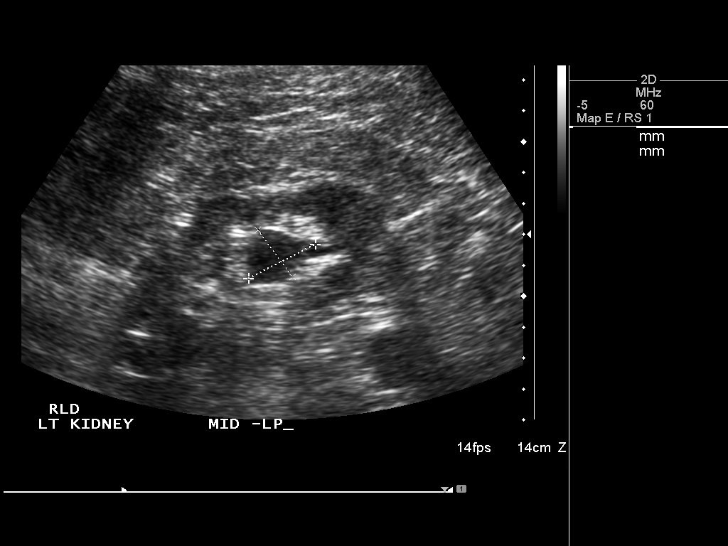
[im 25/34]
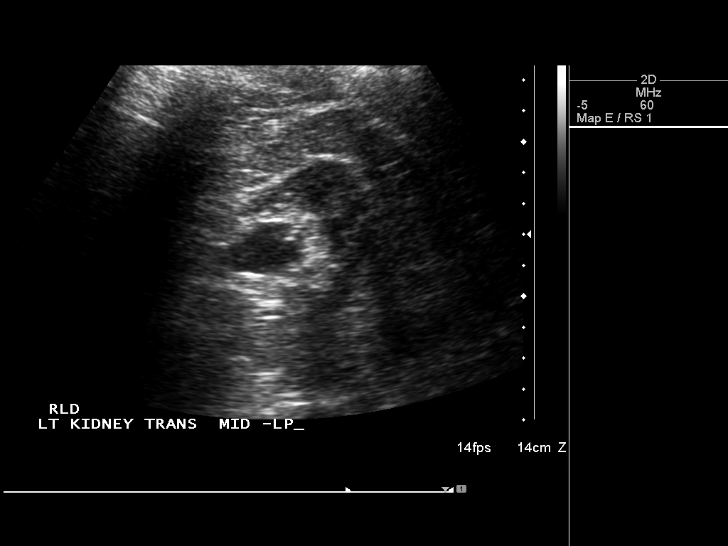
[im 28/34]
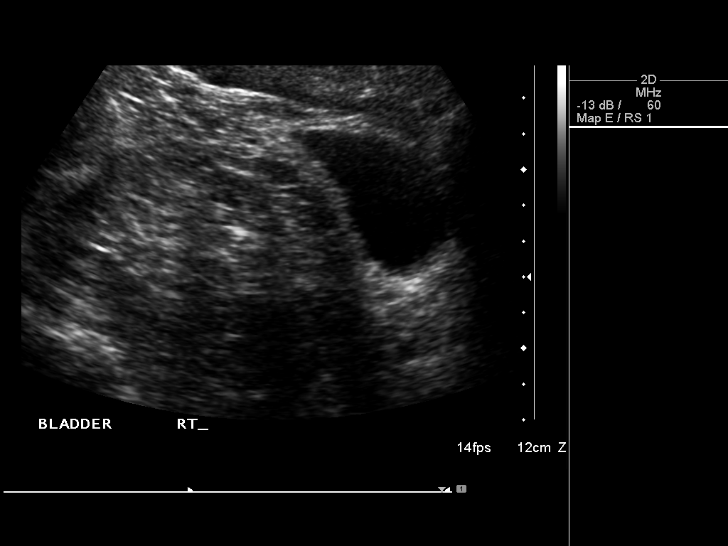
[im 31/34]
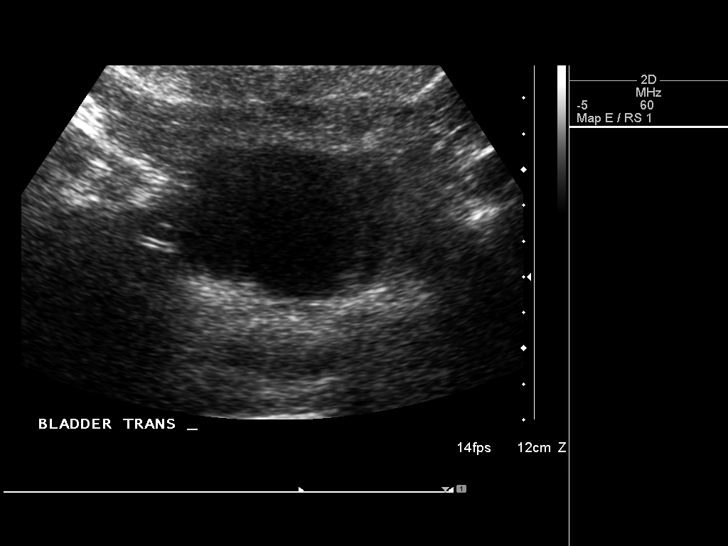
[im 34/34]
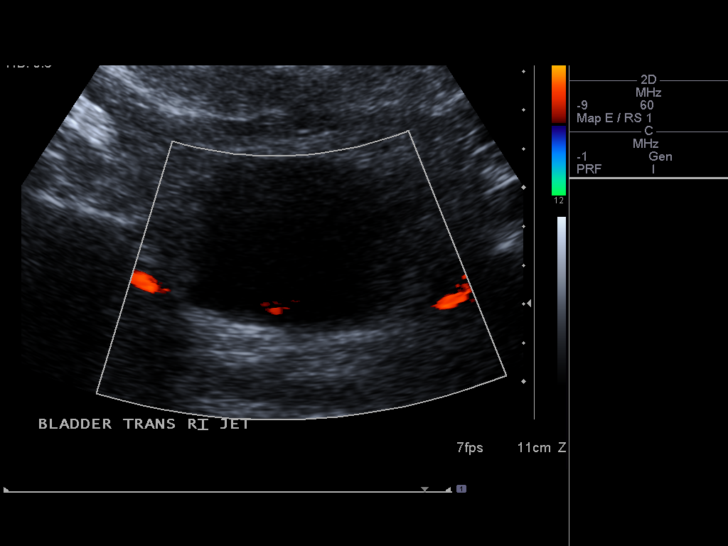

[14 of 25 positions shown; findings below may reference images not displayed]

FINDINGS: Right Kidney:

Length: 12.7 cm. The renal cortical echotexture remains lower than
that of the adjacent liver. There is no hydronephrosis nor
parenchymal mass.

Left Kidney:

Length: 12.4 cm. The renal cortical echotexture is normal. There is
a mid to lower pole parapelvic cyst measuring 2.4 x 1.9 x 2.5 cm.
There is no solid mass nor hydronephrosis.

Bladder:

The partially distended urinary bladder reveals no acute
abnormality. Bilateral ureteral jets are observed.
IMPRESSION: 1. There is no hydronephrosis.
2. The renal cortical echotexture remains normal. There is a 2.5 cm
simple appearing left parapelvic cyst.

## 2015-10-24 DIAGNOSIS — M25512 Pain in left shoulder: Secondary | ICD-10-CM | POA: Diagnosis not present

## 2015-11-01 DIAGNOSIS — R7303 Prediabetes: Secondary | ICD-10-CM | POA: Diagnosis not present

## 2015-11-01 DIAGNOSIS — N183 Chronic kidney disease, stage 3 (moderate): Secondary | ICD-10-CM | POA: Diagnosis not present

## 2015-11-01 DIAGNOSIS — E7800 Pure hypercholesterolemia, unspecified: Secondary | ICD-10-CM | POA: Diagnosis not present

## 2015-11-01 DIAGNOSIS — I129 Hypertensive chronic kidney disease with stage 1 through stage 4 chronic kidney disease, or unspecified chronic kidney disease: Secondary | ICD-10-CM | POA: Diagnosis not present

## 2016-02-04 DIAGNOSIS — N400 Enlarged prostate without lower urinary tract symptoms: Secondary | ICD-10-CM | POA: Diagnosis not present

## 2016-02-04 DIAGNOSIS — R972 Elevated prostate specific antigen [PSA]: Secondary | ICD-10-CM | POA: Diagnosis not present

## 2016-03-12 DIAGNOSIS — Z0000 Encounter for general adult medical examination without abnormal findings: Secondary | ICD-10-CM | POA: Diagnosis not present

## 2016-03-12 DIAGNOSIS — I10 Essential (primary) hypertension: Secondary | ICD-10-CM | POA: Diagnosis not present

## 2016-03-12 DIAGNOSIS — Z125 Encounter for screening for malignant neoplasm of prostate: Secondary | ICD-10-CM | POA: Diagnosis not present

## 2016-03-12 DIAGNOSIS — N183 Chronic kidney disease, stage 3 (moderate): Secondary | ICD-10-CM | POA: Diagnosis not present

## 2016-03-12 DIAGNOSIS — E7800 Pure hypercholesterolemia, unspecified: Secondary | ICD-10-CM | POA: Diagnosis not present

## 2016-03-19 DIAGNOSIS — Z23 Encounter for immunization: Secondary | ICD-10-CM | POA: Diagnosis not present

## 2016-03-19 DIAGNOSIS — I10 Essential (primary) hypertension: Secondary | ICD-10-CM | POA: Diagnosis not present

## 2016-03-19 DIAGNOSIS — E7800 Pure hypercholesterolemia, unspecified: Secondary | ICD-10-CM | POA: Diagnosis not present

## 2016-03-19 DIAGNOSIS — K219 Gastro-esophageal reflux disease without esophagitis: Secondary | ICD-10-CM | POA: Diagnosis not present

## 2016-03-19 DIAGNOSIS — R51 Headache: Secondary | ICD-10-CM | POA: Diagnosis not present

## 2016-03-19 DIAGNOSIS — N281 Cyst of kidney, acquired: Secondary | ICD-10-CM | POA: Diagnosis not present

## 2016-04-09 DIAGNOSIS — N183 Chronic kidney disease, stage 3 (moderate): Secondary | ICD-10-CM | POA: Diagnosis not present

## 2016-04-24 DIAGNOSIS — Z23 Encounter for immunization: Secondary | ICD-10-CM | POA: Diagnosis not present

## 2016-05-15 DIAGNOSIS — E7800 Pure hypercholesterolemia, unspecified: Secondary | ICD-10-CM | POA: Diagnosis not present

## 2016-05-15 DIAGNOSIS — Z6830 Body mass index (BMI) 30.0-30.9, adult: Secondary | ICD-10-CM | POA: Diagnosis not present

## 2016-05-15 DIAGNOSIS — R7303 Prediabetes: Secondary | ICD-10-CM | POA: Diagnosis not present

## 2016-05-15 DIAGNOSIS — M75102 Unspecified rotator cuff tear or rupture of left shoulder, not specified as traumatic: Secondary | ICD-10-CM | POA: Diagnosis not present

## 2016-05-15 DIAGNOSIS — N183 Chronic kidney disease, stage 3 (moderate): Secondary | ICD-10-CM | POA: Diagnosis not present

## 2016-05-15 DIAGNOSIS — I129 Hypertensive chronic kidney disease with stage 1 through stage 4 chronic kidney disease, or unspecified chronic kidney disease: Secondary | ICD-10-CM | POA: Diagnosis not present

## 2016-05-21 IMAGING — US US RENAL
1 series · 14 of 25 positions shown · non-contrast
Comparison: Renal ultrasound March 01, 2015

CLINICAL DATA: Chronic renal insufficiency stage III, low back
pain.

EXAM:
RENAL / URINARY TRACT ULTRASOUND COMPLETE

[Series 1: us renal · 0.22mm/px · 14 of 37 slices shown]
[im 1/37]
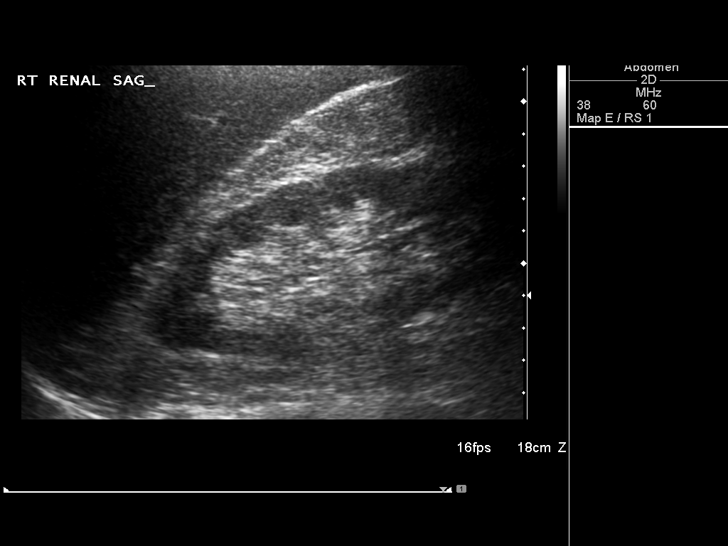
[im 4/37]
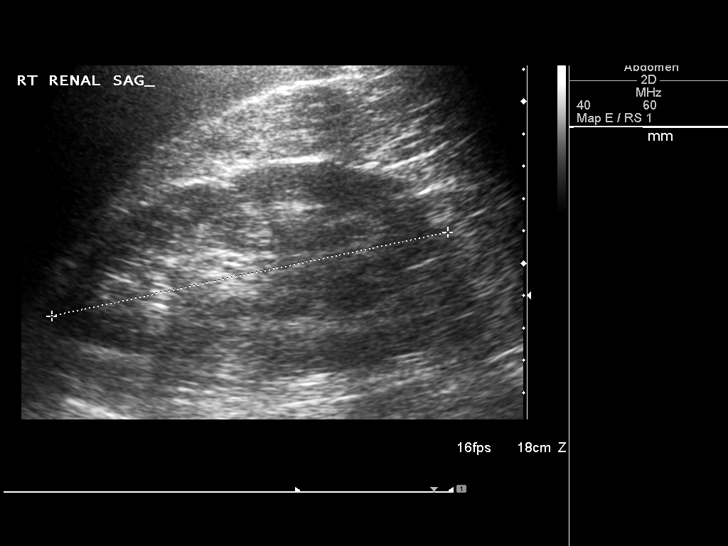
[im 7/37]
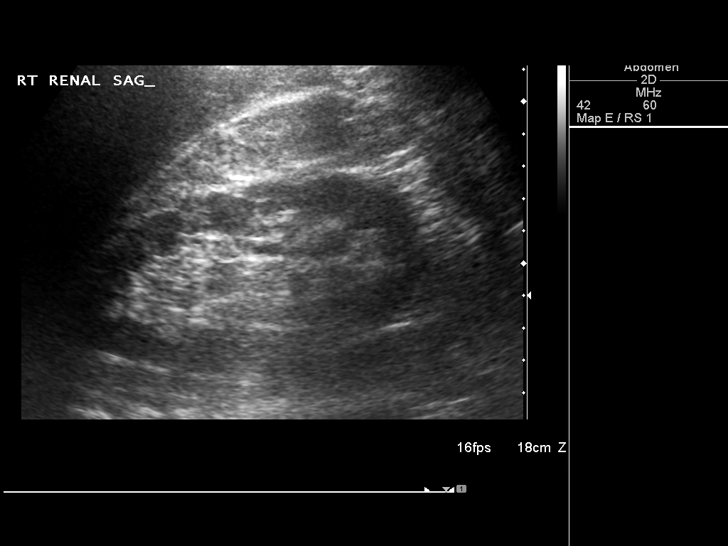
[im 10/37]
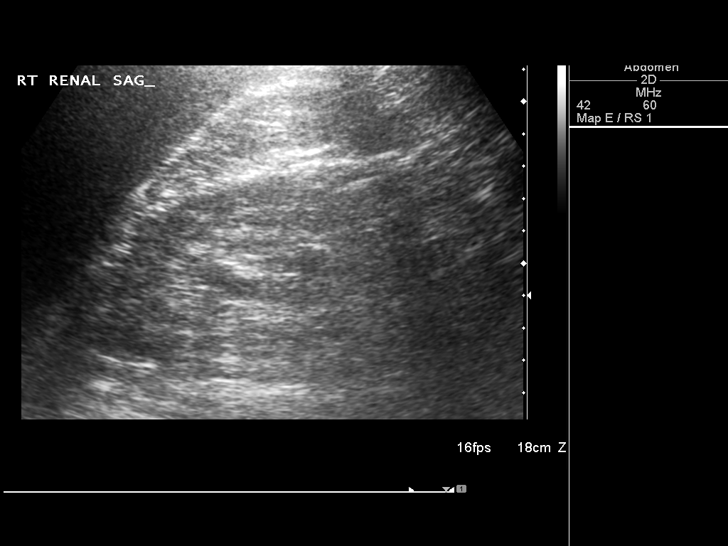
[im 13/37]
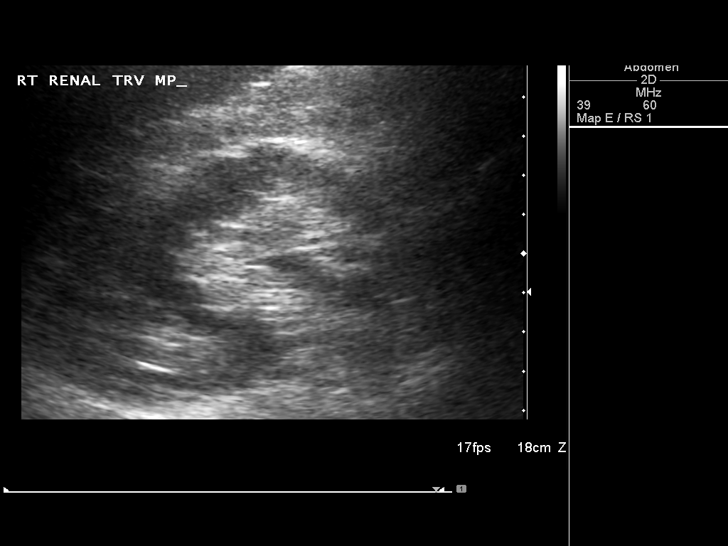
[im 14/37]
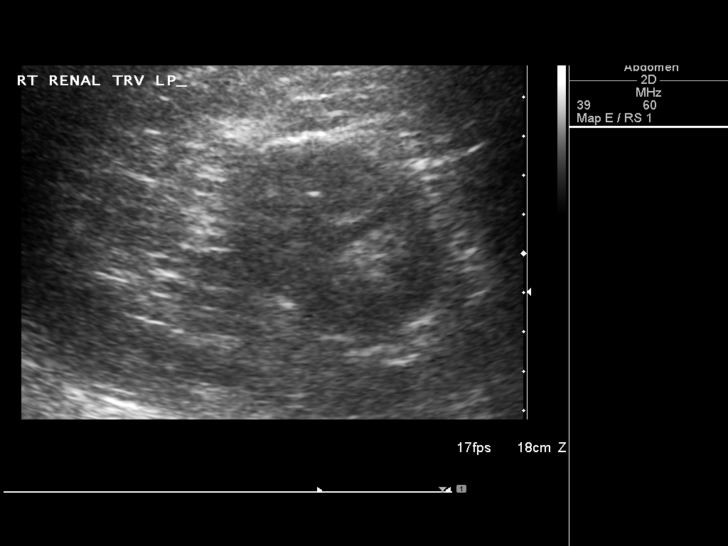
[im 17/37]
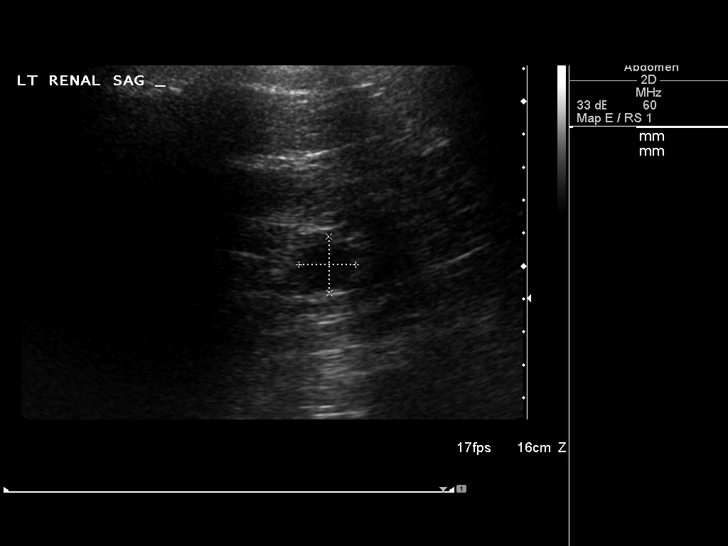
[im 20/37]
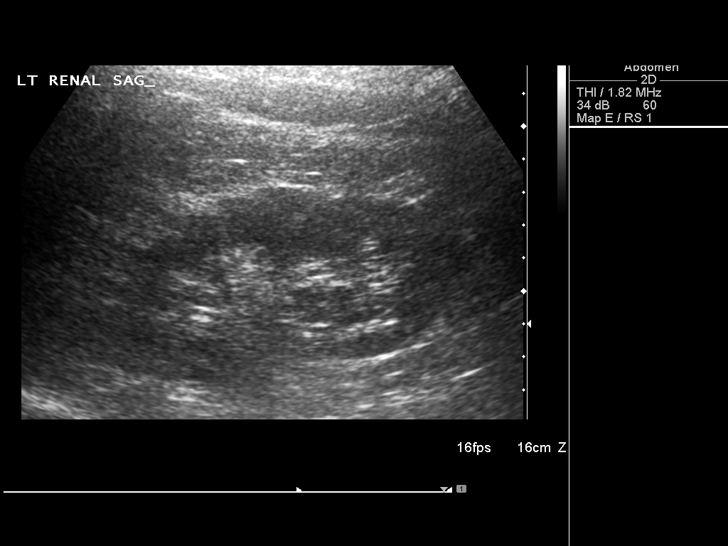
[im 23/37]
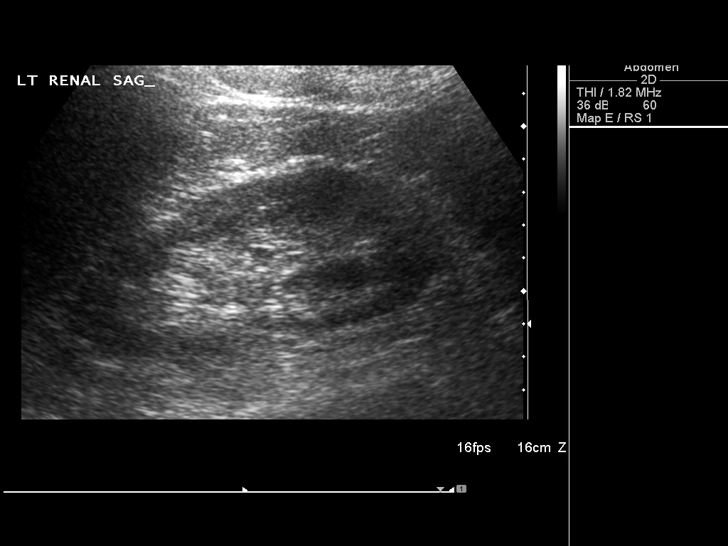
[im 25/37]
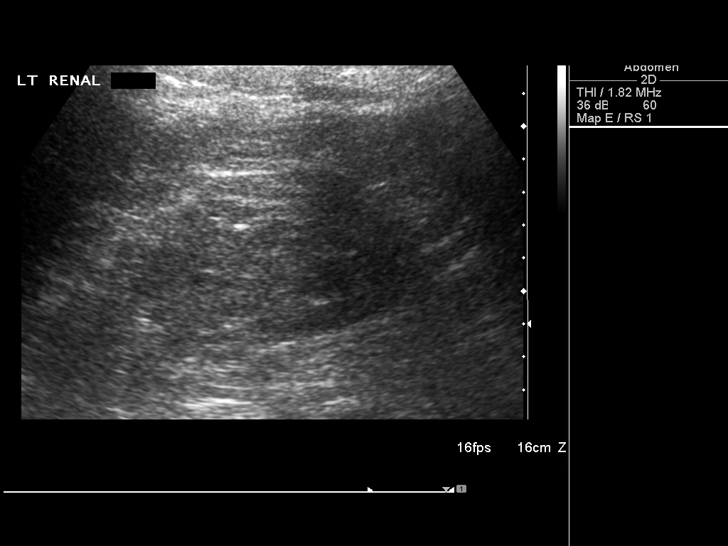
[im 28/37]
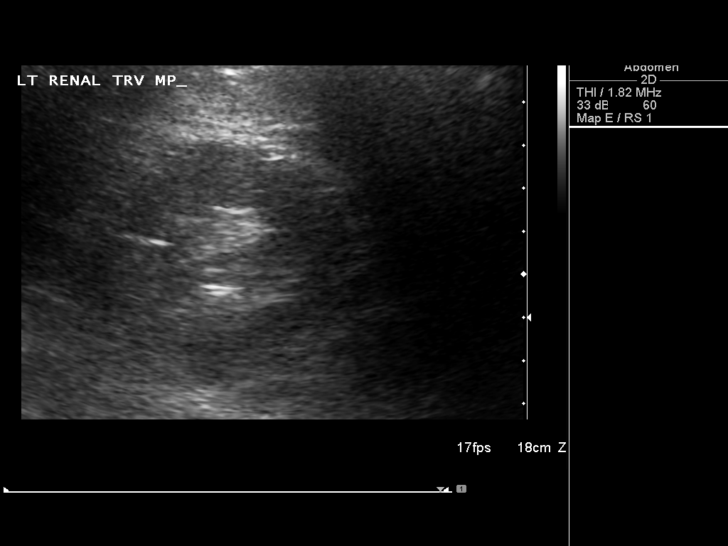
[im 31/37]
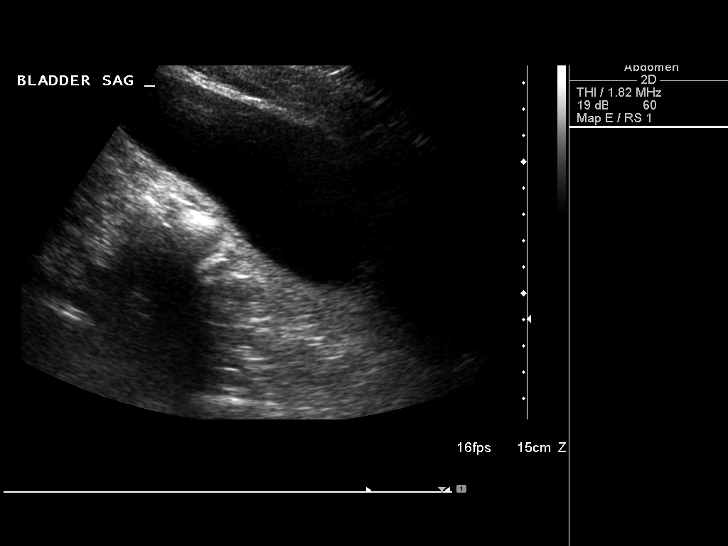
[im 34/37]
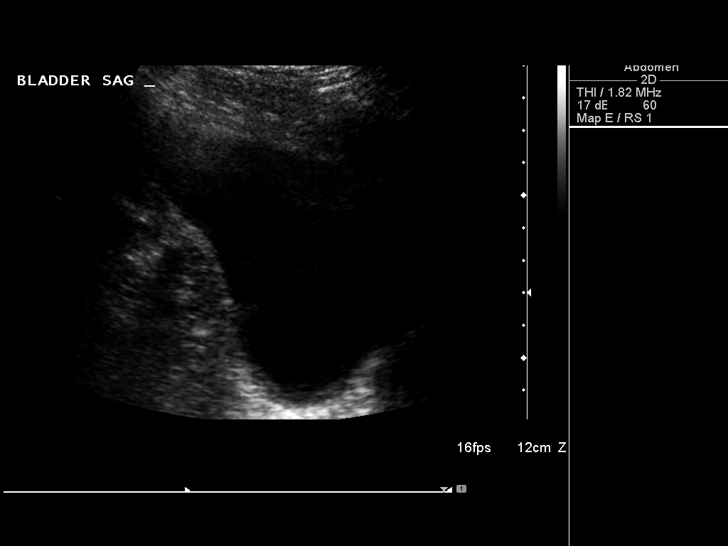
[im 37/37]
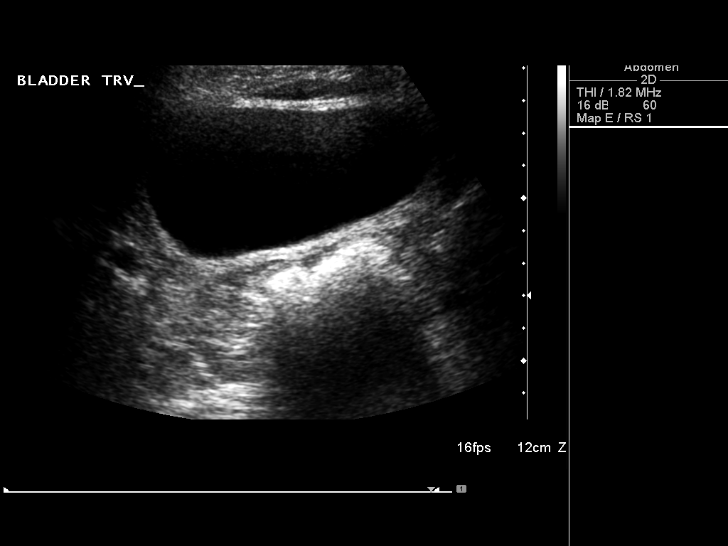

[14 of 25 positions shown; findings below may reference images not displayed]

FINDINGS: Right Kidney:

Length: 12.5 cm. The renal cortical echotexture remains lower than
that of the adjacent liver. There is no focal mass or
hydronephrosis. No calcified stones are evident.

Left Kidney:

Length: 11.9 cm. The renal cortical echotexture is normal similar to
that on the right. There is a lower pole cyst measuring 1.8 cm in
greatest dimension. There is no hydronephrosis. No calcified stones
are evident.

Bladder:

The partially distended urinary bladder is normal in appearance.
IMPRESSION: Normal renal ultrasound examination. A simple appearing lower pole
cyst in the left kidney is again demonstrated and is slightly
smaller.

## 2016-07-23 DIAGNOSIS — H2513 Age-related nuclear cataract, bilateral: Secondary | ICD-10-CM | POA: Diagnosis not present

## 2016-07-30 DIAGNOSIS — Z85828 Personal history of other malignant neoplasm of skin: Secondary | ICD-10-CM | POA: Diagnosis not present

## 2016-07-30 DIAGNOSIS — N400 Enlarged prostate without lower urinary tract symptoms: Secondary | ICD-10-CM | POA: Diagnosis not present

## 2016-07-30 DIAGNOSIS — L570 Actinic keratosis: Secondary | ICD-10-CM | POA: Diagnosis not present

## 2016-07-30 DIAGNOSIS — L821 Other seborrheic keratosis: Secondary | ICD-10-CM | POA: Diagnosis not present

## 2016-07-30 DIAGNOSIS — C44319 Basal cell carcinoma of skin of other parts of face: Secondary | ICD-10-CM | POA: Diagnosis not present

## 2016-07-30 DIAGNOSIS — L814 Other melanin hyperpigmentation: Secondary | ICD-10-CM | POA: Diagnosis not present

## 2016-07-30 DIAGNOSIS — D485 Neoplasm of uncertain behavior of skin: Secondary | ICD-10-CM | POA: Diagnosis not present

## 2016-07-30 DIAGNOSIS — D1801 Hemangioma of skin and subcutaneous tissue: Secondary | ICD-10-CM | POA: Diagnosis not present

## 2016-07-30 DIAGNOSIS — N183 Chronic kidney disease, stage 3 (moderate): Secondary | ICD-10-CM | POA: Diagnosis not present

## 2016-08-06 DIAGNOSIS — R972 Elevated prostate specific antigen [PSA]: Secondary | ICD-10-CM | POA: Diagnosis not present

## 2016-08-06 DIAGNOSIS — R351 Nocturia: Secondary | ICD-10-CM | POA: Diagnosis not present

## 2016-09-25 DIAGNOSIS — C44319 Basal cell carcinoma of skin of other parts of face: Secondary | ICD-10-CM | POA: Diagnosis not present

## 2016-11-21 DIAGNOSIS — N183 Chronic kidney disease, stage 3 (moderate): Secondary | ICD-10-CM | POA: Diagnosis not present

## 2016-12-01 DIAGNOSIS — E7800 Pure hypercholesterolemia, unspecified: Secondary | ICD-10-CM | POA: Diagnosis not present

## 2016-12-01 DIAGNOSIS — M75102 Unspecified rotator cuff tear or rupture of left shoulder, not specified as traumatic: Secondary | ICD-10-CM | POA: Diagnosis not present

## 2016-12-01 DIAGNOSIS — R7303 Prediabetes: Secondary | ICD-10-CM | POA: Diagnosis not present

## 2016-12-01 DIAGNOSIS — I129 Hypertensive chronic kidney disease with stage 1 through stage 4 chronic kidney disease, or unspecified chronic kidney disease: Secondary | ICD-10-CM | POA: Diagnosis not present

## 2016-12-01 DIAGNOSIS — N183 Chronic kidney disease, stage 3 (moderate): Secondary | ICD-10-CM | POA: Diagnosis not present

## 2016-12-01 DIAGNOSIS — Z6829 Body mass index (BMI) 29.0-29.9, adult: Secondary | ICD-10-CM | POA: Diagnosis not present

## 2016-12-24 DIAGNOSIS — T783XXA Angioneurotic edema, initial encounter: Secondary | ICD-10-CM | POA: Insufficient documentation

## 2016-12-24 DIAGNOSIS — Z91010 Allergy to peanuts: Secondary | ICD-10-CM | POA: Insufficient documentation

## 2016-12-24 DIAGNOSIS — Z79899 Other long term (current) drug therapy: Secondary | ICD-10-CM | POA: Insufficient documentation

## 2016-12-25 ENCOUNTER — Emergency Department (HOSPITAL_COMMUNITY)
Admission: EM | Admit: 2016-12-25 | Discharge: 2016-12-25 | Disposition: A | Discharge status: Home / Self Care | Payer: Medicare Other | Attending: Emergency Medicine | Admitting: Emergency Medicine

## 2016-12-25 ENCOUNTER — Encounter (HOSPITAL_COMMUNITY): Payer: Self-pay

## 2016-12-25 DIAGNOSIS — T783XXA Angioneurotic edema, initial encounter: Secondary | ICD-10-CM

## 2016-12-25 DIAGNOSIS — I10 Essential (primary) hypertension: Secondary | ICD-10-CM | POA: Diagnosis not present

## 2016-12-25 LAB — CBC WITH DIFFERENTIAL/PLATELET
BASOS ABS: 0 10*3/uL (ref 0.0–0.1)
BASOS PCT: 0 %
Eosinophils Absolute: 0.1 10*3/uL (ref 0.0–0.7)
Eosinophils Relative: 1 %
HEMATOCRIT: 40.7 % (ref 39.0–52.0)
HEMOGLOBIN: 13.6 g/dL (ref 13.0–17.0)
Lymphocytes Relative: 25 %
Lymphs Abs: 2.2 10*3/uL (ref 0.7–4.0)
MCH: 31.6 pg (ref 26.0–34.0)
MCHC: 33.4 g/dL (ref 30.0–36.0)
MCV: 94.4 fL (ref 78.0–100.0)
Monocytes Absolute: 0.4 10*3/uL (ref 0.1–1.0)
Monocytes Relative: 4 %
NEUTROS ABS: 6.2 10*3/uL (ref 1.7–7.7)
Neutrophils Relative %: 70 %
Platelets: 164 10*3/uL (ref 150–400)
RBC: 4.31 MIL/uL (ref 4.22–5.81)
RDW: 13.7 % (ref 11.5–15.5)
WBC: 8.8 10*3/uL (ref 4.0–10.5)

## 2016-12-25 LAB — COMPREHENSIVE METABOLIC PANEL
ALBUMIN: 3.4 g/dL — AB (ref 3.5–5.0)
ALK PHOS: 77 U/L (ref 38–126)
ALT: 14 U/L — ABNORMAL LOW (ref 17–63)
AST: 23 U/L (ref 15–41)
Anion gap: 7 (ref 5–15)
BILIRUBIN TOTAL: 0.8 mg/dL (ref 0.3–1.2)
BUN: 38 mg/dL — ABNORMAL HIGH (ref 6–20)
CALCIUM: 8.4 mg/dL — AB (ref 8.9–10.3)
CO2: 28 mmol/L (ref 22–32)
Chloride: 106 mmol/L (ref 101–111)
Creatinine, Ser: 1.89 mg/dL — ABNORMAL HIGH (ref 0.61–1.24)
GFR calc Af Amer: 41 mL/min — ABNORMAL LOW (ref >60)
GFR calc non Af Amer: 35 mL/min — ABNORMAL LOW (ref >60)
GLUCOSE: 95 mg/dL (ref 65–99)
Potassium: 3.5 mmol/L (ref 3.5–5.1)
Sodium: 141 mmol/L (ref 135–145)
TOTAL PROTEIN: 5.9 g/dL — AB (ref 6.5–8.1)

## 2016-12-25 MED ORDER — FAMOTIDINE 20 MG PO TABS
20.0000 mg | ORAL_TABLET | Freq: Once | ORAL | Status: AC
  Administered 2016-12-25: 20 mg via ORAL
  Filled 2016-12-25: qty 1

## 2016-12-25 MED ORDER — PREDNISONE 20 MG PO TABS: ORAL_TABLET | ORAL | 0 refills | Status: DC

## 2016-12-25 MED ORDER — FAMOTIDINE 20 MG PO TABS: 20.0000 mg | ORAL_TABLET | Freq: Two times a day (BID) | ORAL | 0 refills | Status: DC

## 2016-12-25 MED ORDER — METHYLPREDNISOLONE SODIUM SUCC 125 MG IJ SOLR
125.0000 mg | Freq: Once | INTRAMUSCULAR | Status: AC
  Administered 2016-12-25: 125 mg via INTRAVENOUS
  Filled 2016-12-25: qty 2

## 2016-12-25 NOTE — ED Provider Notes (Signed)
[facility] DEPT Provider Note   CSN: [id number] Arrival date & time: 12/24/16  2357  By signing my name below, I, Lise Auer, attest that this documentation has been prepared under the direction and in the presence of Rolland Porter, MD. Electronically Signed: Lise Auer, ED Scribe. 12/25/16. 3:29 AM.  Time seen 12:52 AM  History   Chief Complaint Chief Complaint  Patient presents with  . Angioedema   The history is provided by the patient and the spouse. No language interpreter was used.   HPI Comments: Donald Vaughn is a 66 y.o. male with a PMHx of GERD, CA, and HLD, who presents to the Emergency Department complaining of sudden onset of swelling and itching to the lips around 20:00 tonight. His associated symptoms include numbness and dry "rawness" of the throat. Pt states he was eating dinner when he started itching With a rash around his waist area which he then notes spread to his jaws and lips. PTA he notes taking "3 big swigs" of the [employer] brand allergy cough syrup with mild relief. He has had an episode like this previously and notes the happened after consuming peanuts. PT at this time still notes mild swelling to the lip and chin area. Denies shortness of breath, swelling of his tongue, difficulty swallowing, difficulty breathing or pharyngitis at the time of the episode or currently. No other acute associated symptoms at this time. He states he ate food that his wife cooks and she make sure they do not use anything that has peanuts or is exposed to peanut oil. He states however this feels different from when he had his food allergy reactions in the past. He states he has been on losartan for about 2-1/2 years. However he states he did not take it today because his blood pressure was normal.  PCP Deland Pretty, MD   Past Medical History:  Diagnosis Date  . Alcohol abuse    hx  . Arthritis   . BCC (basal cell carcinoma of skin)   . Benign prostatic hypertrophy    hx  .  BPH (benign prostatic hyperplasia)   . Chest pain, atypical   . Diverticulitis   . Generalized headaches   . GERD (gastroesophageal reflux disease)    with history of esophagitis  . Hearing loss   . Hypercholesterolemia   . Leukopenia    mild  . Obese   . Pre-diabetes   . Rectal bleeding   . Skin cancer   . Tinnitus    chronic    Patient Active Problem List   Diagnosis Date Noted  . PALPITATIONS 06/19/2009  . CHEST PAIN-UNSPECIFIED 06/19/2009  . DYSLIPIDEMIA 04/29/2009  . CHEST PAIN, ATYPICAL 04/29/2009  . ALCOHOL ABUSE, HX OF 04/29/2009  . HYPERCHOLESTEROLEMIA 08/31/2008  . BENIGN PROSTATIC HYPERTROPHY, HX OF 08/31/2008    Past Surgical History:  Procedure Laterality Date  . BACK SURGERY     L4/5 Dr Lyman Speller  . COLONOSCOPY  2/03   norma. (Dr. Lajoyce Corners)  . EGD- normal  2/03   Dr. Lajoyce Corners.  Marland Kitchen HERNIA REPAIR     umbilical with PVP  . left foot surgery    . R shoulder surgery  4/03   Dr. Noemi Chapel       Home Medications    Prior to Admission medications   Medication Sig Start Date End Date Taking? Authorizing Provider  CALCIUM PO Take 1 tablet by mouth daily.   Yes [provider]  Hyaluronic Acid-Vitamin C (HYALURONIC ACID PO)  Take 1 tablet by mouth daily.   Yes [provider]  losartan-hydrochlorothiazide (HYZAAR) 100-12.5 MG tablet Take 0.5 tablets by mouth daily.   Yes [provider]  Multiple Vitamins-Minerals (PRESERVISION AREDS PO) Take 1 tablet by mouth daily.   Yes [provider]  famotidine (PEPCID) 20 MG tablet Take 1 tablet (20 mg total) by mouth 2 (two) times daily. 12/25/16   Rolland Porter, MD  predniSONE (DELTASONE) 20 MG tablet Take 3 po QD x 3d , then 2 po QD x 3d then 1 po QD x 3d 12/25/16   Rolland Porter, MD    Family History Family History  Problem Relation Age of Onset  . Lymphoma Mother   . Hypertension Mother   . Hyperlipidemia Mother   . Diabetes Mother   . Alzheimer's disease Father     Social History Social  History  Substance Use Topics  . Smoking status: Never Smoker  . Smokeless tobacco: Never Used  . Alcohol use No  lives with spouse Lives at home   Allergies   Cats claw [uncaria tomentosa (cats claw)]; Codeine; Oxycodone-aspirin; Peanut butter flavor; Septra [sulfamethoxazole-trimethoprim]; and Sulfonamide derivatives   Review of Systems Review of Systems  HENT: Positive for facial swelling. Negative for sore throat.   Respiratory: Negative for shortness of breath.   Neurological: Positive for numbness.  All other systems reviewed and are negative.    Physical Exam Updated Vital Signs BP (!) 147/73 (BP Location: Right Arm)   Pulse 77   Temp 97.9 F (36.6 C) (Oral)   Resp 19   SpO2 99%   Vital signs normal    Physical Exam  Constitutional: He is oriented to person, place, and time. He appears well-developed and well-nourished.  Non-toxic appearance. He does not appear ill. No distress.  HENT:  Head: Normocephalic and atraumatic.  Right Ear: External ear normal.  Left Ear: External ear normal.  Nose: Nose normal. No mucosal edema or rhinorrhea.  Mouth/Throat: Oropharynx is clear and moist and mucous membranes are normal. No dental abscesses or uvula swelling.  Mild swelling of the chin area.    Eyes: Conjunctivae and EOM are normal. Pupils are equal, round, and reactive to light.  Neck: Normal range of motion and full passive range of motion without pain. Neck supple.  Cardiovascular: Normal rate, regular rhythm and normal heart sounds.  Exam reveals no gallop and no friction rub.   No murmur heard. Pulmonary/Chest: Effort normal and breath sounds normal. No respiratory distress. He has no wheezes. He has no rhonchi. He has no rales. He exhibits no tenderness and no crepitus.  Abdominal: Soft. Normal appearance and bowel sounds are normal. He exhibits no distension. There is no tenderness. There is no rebound and no guarding.  Musculoskeletal: Normal range of motion.  He exhibits no edema or tenderness.  Moves all extremities well.   Neurological: He is alert and oriented to person, place, and time. He has normal strength. No cranial nerve deficit.  Skin: Skin is warm, dry and intact. No rash noted. No erythema. No pallor.  Psychiatric: He has a normal mood and affect. His speech is normal and behavior is normal. His mood appears not anxious.  Nursing note and vitals reviewed.    ED Treatments / Results  Labs (all labs ordered are listed, but only abnormal results are displayed)  Results for orders placed or performed during the hospital encounter of 12/25/16  CBC with Differential  Result Value Ref Range   WBC  8.8 4.0 - 10.5 K/uL   RBC 4.31 4.22 - 5.81 MIL/uL   Hemoglobin 13.6 13.0 - 17.0 g/dL   HCT 40.7 39.0 - 52.0 %   MCV 94.4 78.0 - 100.0 fL   MCH 31.6 26.0 - 34.0 pg   MCHC 33.4 30.0 - 36.0 g/dL   RDW 13.7 11.5 - 15.5 %   Platelets 164 150 - 400 K/uL   Neutrophils Relative % 70 %   Neutro Abs 6.2 1.7 - 7.7 K/uL   Lymphocytes Relative 25 %   Lymphs Abs 2.2 0.7 - 4.0 K/uL   Monocytes Relative 4 %   Monocytes Absolute 0.4 0.1 - 1.0 K/uL   Eosinophils Relative 1 %   Eosinophils Absolute 0.1 0.0 - 0.7 K/uL   Basophils Relative 0 %   Basophils Absolute 0.0 0.0 - 0.1 K/uL  Comprehensive metabolic panel  Result Value Ref Range   Sodium 141 135 - 145 mmol/L   Potassium 3.5 3.5 - 5.1 mmol/L   Chloride 106 101 - 111 mmol/L   CO2 28 22 - 32 mmol/L   Glucose, Bld 95 65 - 99 mg/dL   BUN 38 (H) 6 - 20 mg/dL   Creatinine, Ser 1.89 (H) 0.61 - 1.24 mg/dL   Calcium 8.4 (L) 8.9 - 10.3 mg/dL   Total Protein 5.9 (L) 6.5 - 8.1 g/dL   Albumin 3.4 (L) 3.5 - 5.0 g/dL   AST 23 15 - 41 U/L   ALT 14 (L) 17 - 63 U/L   Alkaline Phosphatase 77 38 - 126 U/L   Total Bilirubin 0.8 0.3 - 1.2 mg/dL   GFR calc non Af Amer 35 (L) >60 mL/min   GFR calc Af Amer 41 (L) >60 mL/min   Anion gap 7 5 - 15   Laboratory interpretation all normal     EKG  EKG  Interpretation None       Radiology No results found.  Procedures Procedures (including critical care time)  Medications Ordered in ED Medications  methylPREDNISolone sodium succinate (SOLU-MEDROL) 125 mg/2 mL injection 125 mg (125 mg Intravenous Given 12/25/16 0133)  famotidine (PEPCID) tablet 20 mg (20 mg Oral Given 12/25/16 0133)     Initial Impression / Assessment and Plan / ED Course  I have reviewed the triage vital signs and the nursing notes.  Pertinent labs & imaging results that were available during my care of the patient were reviewed by me and considered in my medical decision making (see chart for details).    DIAGNOSTIC STUDIES: Oxygen Saturation is 99% on RA, normal by my interpretation.   COORDINATION OF CARE: 1:02 AM-Discussed next steps with pt. Pt verbalized understanding and is agreeable with the plan. Patient had already taken an unknown amount of Benadryl at home with some improvement of his symptoms. He was given Solu-Medrol IM and oral Pepcid. Patient was given the choice of taking the IM and oral medication or getting IV and he chose to do the former.  2:33 AM- Swelling in pt's chin has resloved with treatment. He feels like he may have minimal swelling of his lower lip just in the middle section. It is not obviously swollen. Patient was discharged home with prednisone and Pepcid. He can take Benadryl or Zyrtec over-the-counter for itching and swelling or rash. He also discussed letting his primary care doctor know later today about his ED visit. Patient states he did not take his losartan today however losartan can cause angioedema. His primary care doctor can decide  if he needs to change his medication.   Final Clinical Impressions(s) / ED Diagnoses   Final diagnoses:  Angioedema, initial encounter    New Prescriptions Discharge Medication List as of 12/25/2016  3:27 AM    START taking these medications   Details  famotidine (PEPCID) 20 MG  tablet Take 1 tablet (20 mg total) by mouth 2 (two) times daily., Starting Thu 12/25/2016, Print    predniSONE (DELTASONE) 20 MG tablet Take 3 po QD x 3d , then 2 po QD x 3d then 1 po QD x 3d, Print      OTC Benadryl or Zyrtec   Plan discharge  Rolland Porter, MD, FACEP  I personally performed the services described in this documentation, which was scribed in my presence. The recorded information has been reviewed and considered.  Rolland Porter, MD, Barbette Or, MD 12/25/16 [phone]

## 2016-12-25 NOTE — ED Notes (Signed)
Pt departed in NAD.  

## 2016-12-25 NOTE — ED Triage Notes (Signed)
Pt here for angioedema to lower lip, reports that prayer meeting and ate fruit tots and sloppy joes has allergy to peanuts but didn't eat any tonight did drink benadryl unknown amount to assist with allergy.

## 2016-12-25 NOTE — Discharge Instructions (Signed)
Take the pepcid and prednisone until gone. Try to avoid getting hot, that may make you break out in a rash again or the swelling to get worse.  Take either benadryl 50 mg every 6 hrs OR Zyrtec OTC once a day for the swelling, rash or itching.  Please call Dr Pennie Banter office later today to let him know about your ED visit. He may want to change your blood pressure medication.   Return to the ED if you get worse again.   Use your epi-pen if you feel like you are having swelling in your throat and have difficulty breathing or swallowing.

## 2017-01-02 DIAGNOSIS — L501 Idiopathic urticaria: Secondary | ICD-10-CM | POA: Diagnosis not present

## 2017-01-02 DIAGNOSIS — J3089 Other allergic rhinitis: Secondary | ICD-10-CM | POA: Diagnosis not present

## 2017-01-02 DIAGNOSIS — J301 Allergic rhinitis due to pollen: Secondary | ICD-10-CM | POA: Diagnosis not present

## 2017-01-02 DIAGNOSIS — T783XXA Angioneurotic edema, initial encounter: Secondary | ICD-10-CM | POA: Diagnosis not present

## 2017-01-02 DIAGNOSIS — J309 Allergic rhinitis, unspecified: Secondary | ICD-10-CM | POA: Diagnosis not present

## 2017-01-08 DIAGNOSIS — T783XXA Angioneurotic edema, initial encounter: Secondary | ICD-10-CM | POA: Diagnosis not present

## 2017-01-08 DIAGNOSIS — I10 Essential (primary) hypertension: Secondary | ICD-10-CM | POA: Diagnosis not present

## 2017-01-08 DIAGNOSIS — Z888 Allergy status to other drugs, medicaments and biological substances status: Secondary | ICD-10-CM | POA: Diagnosis not present

## 2017-01-21 DIAGNOSIS — T783XXD Angioneurotic edema, subsequent encounter: Secondary | ICD-10-CM | POA: Diagnosis not present

## 2017-01-21 DIAGNOSIS — Z91018 Allergy to other foods: Secondary | ICD-10-CM | POA: Diagnosis not present

## 2017-01-21 DIAGNOSIS — I10 Essential (primary) hypertension: Secondary | ICD-10-CM | POA: Diagnosis not present

## 2017-01-28 DIAGNOSIS — R972 Elevated prostate specific antigen [PSA]: Secondary | ICD-10-CM | POA: Diagnosis not present

## 2017-02-04 DIAGNOSIS — R351 Nocturia: Secondary | ICD-10-CM | POA: Diagnosis not present

## 2017-02-04 DIAGNOSIS — N400 Enlarged prostate without lower urinary tract symptoms: Secondary | ICD-10-CM | POA: Diagnosis not present

## 2017-03-17 DIAGNOSIS — Z125 Encounter for screening for malignant neoplasm of prostate: Secondary | ICD-10-CM | POA: Diagnosis not present

## 2017-03-17 DIAGNOSIS — E7800 Pure hypercholesterolemia, unspecified: Secondary | ICD-10-CM | POA: Diagnosis not present

## 2017-03-17 DIAGNOSIS — Z888 Allergy status to other drugs, medicaments and biological substances status: Secondary | ICD-10-CM | POA: Diagnosis not present

## 2017-03-17 DIAGNOSIS — I10 Essential (primary) hypertension: Secondary | ICD-10-CM | POA: Diagnosis not present

## 2017-03-18 DIAGNOSIS — Z0000 Encounter for general adult medical examination without abnormal findings: Secondary | ICD-10-CM | POA: Diagnosis not present

## 2017-03-26 DIAGNOSIS — K439 Ventral hernia without obstruction or gangrene: Secondary | ICD-10-CM | POA: Diagnosis not present

## 2017-03-26 DIAGNOSIS — I10 Essential (primary) hypertension: Secondary | ICD-10-CM | POA: Diagnosis not present

## 2017-03-26 DIAGNOSIS — N183 Chronic kidney disease, stage 3 (moderate): Secondary | ICD-10-CM | POA: Diagnosis not present

## 2017-03-26 DIAGNOSIS — Z85828 Personal history of other malignant neoplasm of skin: Secondary | ICD-10-CM | POA: Diagnosis not present

## 2017-03-26 DIAGNOSIS — Z8719 Personal history of other diseases of the digestive system: Secondary | ICD-10-CM | POA: Diagnosis not present

## 2017-03-26 DIAGNOSIS — R7303 Prediabetes: Secondary | ICD-10-CM | POA: Diagnosis not present

## 2017-03-26 DIAGNOSIS — E663 Overweight: Secondary | ICD-10-CM | POA: Diagnosis not present

## 2017-03-26 DIAGNOSIS — G44209 Tension-type headache, unspecified, not intractable: Secondary | ICD-10-CM | POA: Diagnosis not present

## 2017-03-26 DIAGNOSIS — K219 Gastro-esophageal reflux disease without esophagitis: Secondary | ICD-10-CM | POA: Diagnosis not present

## 2017-03-26 DIAGNOSIS — N281 Cyst of kidney, acquired: Secondary | ICD-10-CM | POA: Diagnosis not present

## 2017-03-26 DIAGNOSIS — E7800 Pure hypercholesterolemia, unspecified: Secondary | ICD-10-CM | POA: Diagnosis not present

## 2017-04-21 DIAGNOSIS — K439 Ventral hernia without obstruction or gangrene: Secondary | ICD-10-CM | POA: Diagnosis not present

## 2017-04-22 ENCOUNTER — Other Ambulatory Visit: Payer: Self-pay | Admitting: General Surgery

## 2017-04-22 DIAGNOSIS — K439 Ventral hernia without obstruction or gangrene: Secondary | ICD-10-CM

## 2017-04-23 DIAGNOSIS — R972 Elevated prostate specific antigen [PSA]: Secondary | ICD-10-CM | POA: Diagnosis not present

## 2017-04-23 DIAGNOSIS — N183 Chronic kidney disease, stage 3 (moderate): Secondary | ICD-10-CM | POA: Diagnosis not present

## 2017-04-24 ENCOUNTER — Ambulatory Visit
Admission: RE | Admit: 2017-04-24 | Discharge: 2017-04-24 | Disposition: A | Discharge status: Home / Self Care | Payer: Medicare Other | Source: Ambulatory Visit | Attending: General Surgery | Admitting: General Surgery

## 2017-04-24 DIAGNOSIS — K5730 Diverticulosis of large intestine without perforation or abscess without bleeding: Secondary | ICD-10-CM | POA: Diagnosis not present

## 2017-04-24 DIAGNOSIS — K439 Ventral hernia without obstruction or gangrene: Secondary | ICD-10-CM

## 2017-04-30 ENCOUNTER — Other Ambulatory Visit: Payer: Self-pay | Admitting: General Surgery

## 2017-04-30 DIAGNOSIS — R229 Localized swelling, mass and lump, unspecified: Secondary | ICD-10-CM | POA: Diagnosis not present

## 2017-05-07 DIAGNOSIS — Z23 Encounter for immunization: Secondary | ICD-10-CM | POA: Diagnosis not present

## 2017-05-11 ENCOUNTER — Encounter (HOSPITAL_BASED_OUTPATIENT_CLINIC_OR_DEPARTMENT_OTHER): Payer: Self-pay | Admitting: *Deleted

## 2017-05-20 ENCOUNTER — Ambulatory Visit (HOSPITAL_BASED_OUTPATIENT_CLINIC_OR_DEPARTMENT_OTHER): Admission: RE | Admit: 2017-05-20 | Payer: Medicare Other | Source: Ambulatory Visit | Admitting: General Surgery

## 2017-05-20 ENCOUNTER — Encounter (HOSPITAL_BASED_OUTPATIENT_CLINIC_OR_DEPARTMENT_OTHER): Admission: RE | Payer: Self-pay | Source: Ambulatory Visit

## 2017-05-20 HISTORY — DX: Essential (primary) hypertension: I10

## 2017-05-20 SURGERY — EXCISION MASS
Anesthesia: Monitor Anesthesia Care | Laterality: Left

## 2017-07-07 DIAGNOSIS — N183 Chronic kidney disease, stage 3 (moderate): Secondary | ICD-10-CM | POA: Diagnosis not present

## 2017-07-07 DIAGNOSIS — Z6829 Body mass index (BMI) 29.0-29.9, adult: Secondary | ICD-10-CM | POA: Diagnosis not present

## 2017-07-07 DIAGNOSIS — N2581 Secondary hyperparathyroidism of renal origin: Secondary | ICD-10-CM | POA: Diagnosis not present

## 2017-07-07 DIAGNOSIS — D631 Anemia in chronic kidney disease: Secondary | ICD-10-CM | POA: Diagnosis not present

## 2017-07-07 DIAGNOSIS — R7303 Prediabetes: Secondary | ICD-10-CM | POA: Diagnosis not present

## 2017-07-07 DIAGNOSIS — E7800 Pure hypercholesterolemia, unspecified: Secondary | ICD-10-CM | POA: Diagnosis not present

## 2017-07-07 DIAGNOSIS — I129 Hypertensive chronic kidney disease with stage 1 through stage 4 chronic kidney disease, or unspecified chronic kidney disease: Secondary | ICD-10-CM | POA: Diagnosis not present

## 2017-07-23 DIAGNOSIS — H2513 Age-related nuclear cataract, bilateral: Secondary | ICD-10-CM | POA: Diagnosis not present

## 2017-09-04 DIAGNOSIS — I10 Essential (primary) hypertension: Secondary | ICD-10-CM | POA: Diagnosis not present

## 2017-11-03 DIAGNOSIS — C44519 Basal cell carcinoma of skin of other part of trunk: Secondary | ICD-10-CM | POA: Diagnosis not present

## 2017-11-03 DIAGNOSIS — D1801 Hemangioma of skin and subcutaneous tissue: Secondary | ICD-10-CM | POA: Diagnosis not present

## 2017-11-03 DIAGNOSIS — D485 Neoplasm of uncertain behavior of skin: Secondary | ICD-10-CM | POA: Diagnosis not present

## 2017-11-03 DIAGNOSIS — Z85828 Personal history of other malignant neoplasm of skin: Secondary | ICD-10-CM | POA: Diagnosis not present

## 2017-11-03 DIAGNOSIS — L814 Other melanin hyperpigmentation: Secondary | ICD-10-CM | POA: Diagnosis not present

## 2017-11-03 DIAGNOSIS — L821 Other seborrheic keratosis: Secondary | ICD-10-CM | POA: Diagnosis not present

## 2017-11-10 DIAGNOSIS — C44519 Basal cell carcinoma of skin of other part of trunk: Secondary | ICD-10-CM | POA: Diagnosis not present

## 2018-01-10 DIAGNOSIS — S61213A Laceration without foreign body of left middle finger without damage to nail, initial encounter: Secondary | ICD-10-CM | POA: Diagnosis not present

## 2018-01-10 DIAGNOSIS — I10 Essential (primary) hypertension: Secondary | ICD-10-CM | POA: Diagnosis not present

## 2018-01-10 DIAGNOSIS — Z23 Encounter for immunization: Secondary | ICD-10-CM | POA: Diagnosis not present

## 2018-01-14 DIAGNOSIS — N183 Chronic kidney disease, stage 3 (moderate): Secondary | ICD-10-CM | POA: Diagnosis not present

## 2018-01-19 DIAGNOSIS — N400 Enlarged prostate without lower urinary tract symptoms: Secondary | ICD-10-CM | POA: Diagnosis not present

## 2018-01-19 DIAGNOSIS — R351 Nocturia: Secondary | ICD-10-CM | POA: Diagnosis not present

## 2018-01-19 DIAGNOSIS — R350 Frequency of micturition: Secondary | ICD-10-CM | POA: Diagnosis not present

## 2018-02-04 DIAGNOSIS — N402 Nodular prostate without lower urinary tract symptoms: Secondary | ICD-10-CM | POA: Diagnosis not present

## 2018-03-12 DIAGNOSIS — N402 Nodular prostate without lower urinary tract symptoms: Secondary | ICD-10-CM | POA: Diagnosis not present

## 2018-03-29 DIAGNOSIS — R7303 Prediabetes: Secondary | ICD-10-CM | POA: Diagnosis not present

## 2018-03-29 DIAGNOSIS — J309 Allergic rhinitis, unspecified: Secondary | ICD-10-CM | POA: Diagnosis not present

## 2018-03-29 DIAGNOSIS — K219 Gastro-esophageal reflux disease without esophagitis: Secondary | ICD-10-CM | POA: Diagnosis not present

## 2018-03-29 DIAGNOSIS — E7800 Pure hypercholesterolemia, unspecified: Secondary | ICD-10-CM | POA: Diagnosis not present

## 2018-03-29 DIAGNOSIS — Z0000 Encounter for general adult medical examination without abnormal findings: Secondary | ICD-10-CM | POA: Diagnosis not present

## 2018-03-29 DIAGNOSIS — G44209 Tension-type headache, unspecified, not intractable: Secondary | ICD-10-CM | POA: Diagnosis not present

## 2018-03-29 DIAGNOSIS — R51 Headache: Secondary | ICD-10-CM | POA: Diagnosis not present

## 2018-03-29 DIAGNOSIS — Z91010 Allergy to peanuts: Secondary | ICD-10-CM | POA: Diagnosis not present

## 2018-03-29 DIAGNOSIS — N401 Enlarged prostate with lower urinary tract symptoms: Secondary | ICD-10-CM | POA: Diagnosis not present

## 2018-03-29 DIAGNOSIS — R05 Cough: Secondary | ICD-10-CM | POA: Diagnosis not present

## 2018-03-29 DIAGNOSIS — I10 Essential (primary) hypertension: Secondary | ICD-10-CM | POA: Diagnosis not present

## 2018-03-29 DIAGNOSIS — N183 Chronic kidney disease, stage 3 (moderate): Secondary | ICD-10-CM | POA: Diagnosis not present

## 2018-04-02 DIAGNOSIS — Z6829 Body mass index (BMI) 29.0-29.9, adult: Secondary | ICD-10-CM | POA: Diagnosis not present

## 2018-04-02 DIAGNOSIS — M75102 Unspecified rotator cuff tear or rupture of left shoulder, not specified as traumatic: Secondary | ICD-10-CM | POA: Diagnosis not present

## 2018-04-02 DIAGNOSIS — D631 Anemia in chronic kidney disease: Secondary | ICD-10-CM | POA: Diagnosis not present

## 2018-04-02 DIAGNOSIS — E7800 Pure hypercholesterolemia, unspecified: Secondary | ICD-10-CM | POA: Diagnosis not present

## 2018-04-02 DIAGNOSIS — I129 Hypertensive chronic kidney disease with stage 1 through stage 4 chronic kidney disease, or unspecified chronic kidney disease: Secondary | ICD-10-CM | POA: Diagnosis not present

## 2018-04-02 DIAGNOSIS — N183 Chronic kidney disease, stage 3 (moderate): Secondary | ICD-10-CM | POA: Diagnosis not present

## 2018-04-02 DIAGNOSIS — R7303 Prediabetes: Secondary | ICD-10-CM | POA: Diagnosis not present

## 2018-04-08 DIAGNOSIS — N402 Nodular prostate without lower urinary tract symptoms: Secondary | ICD-10-CM | POA: Diagnosis not present

## 2018-05-10 DIAGNOSIS — Z23 Encounter for immunization: Secondary | ICD-10-CM | POA: Diagnosis not present

## 2018-06-15 DIAGNOSIS — L821 Other seborrheic keratosis: Secondary | ICD-10-CM | POA: Diagnosis not present

## 2018-06-15 DIAGNOSIS — Z85828 Personal history of other malignant neoplasm of skin: Secondary | ICD-10-CM | POA: Diagnosis not present

## 2018-06-15 DIAGNOSIS — L814 Other melanin hyperpigmentation: Secondary | ICD-10-CM | POA: Diagnosis not present

## 2018-07-05 DIAGNOSIS — Z91018 Allergy to other foods: Secondary | ICD-10-CM | POA: Diagnosis not present

## 2018-07-22 DIAGNOSIS — H2513 Age-related nuclear cataract, bilateral: Secondary | ICD-10-CM | POA: Diagnosis not present

## 2018-08-23 DIAGNOSIS — L820 Inflamed seborrheic keratosis: Secondary | ICD-10-CM | POA: Diagnosis not present

## 2018-08-23 DIAGNOSIS — L821 Other seborrheic keratosis: Secondary | ICD-10-CM | POA: Diagnosis not present

## 2018-09-07 DIAGNOSIS — E7800 Pure hypercholesterolemia, unspecified: Secondary | ICD-10-CM | POA: Diagnosis not present

## 2018-09-07 DIAGNOSIS — I129 Hypertensive chronic kidney disease with stage 1 through stage 4 chronic kidney disease, or unspecified chronic kidney disease: Secondary | ICD-10-CM | POA: Diagnosis not present

## 2018-09-07 DIAGNOSIS — R7303 Prediabetes: Secondary | ICD-10-CM | POA: Diagnosis not present

## 2018-09-07 DIAGNOSIS — M75102 Unspecified rotator cuff tear or rupture of left shoulder, not specified as traumatic: Secondary | ICD-10-CM | POA: Diagnosis not present

## 2018-09-07 DIAGNOSIS — N183 Chronic kidney disease, stage 3 (moderate): Secondary | ICD-10-CM | POA: Diagnosis not present

## 2018-09-07 DIAGNOSIS — Z6829 Body mass index (BMI) 29.0-29.9, adult: Secondary | ICD-10-CM | POA: Diagnosis not present

## 2018-09-14 DIAGNOSIS — R972 Elevated prostate specific antigen [PSA]: Secondary | ICD-10-CM | POA: Diagnosis not present

## 2018-09-17 DIAGNOSIS — J45909 Unspecified asthma, uncomplicated: Secondary | ICD-10-CM | POA: Diagnosis not present

## 2018-09-20 DIAGNOSIS — N402 Nodular prostate without lower urinary tract symptoms: Secondary | ICD-10-CM | POA: Diagnosis not present

## 2018-10-19 DIAGNOSIS — H8110 Benign paroxysmal vertigo, unspecified ear: Secondary | ICD-10-CM | POA: Diagnosis not present

## 2018-10-19 DIAGNOSIS — N183 Chronic kidney disease, stage 3 (moderate): Secondary | ICD-10-CM | POA: Diagnosis not present

## 2018-10-19 DIAGNOSIS — I10 Essential (primary) hypertension: Secondary | ICD-10-CM | POA: Diagnosis not present

## 2018-10-26 DIAGNOSIS — H8110 Benign paroxysmal vertigo, unspecified ear: Secondary | ICD-10-CM | POA: Diagnosis not present

## 2018-10-26 DIAGNOSIS — I10 Essential (primary) hypertension: Secondary | ICD-10-CM | POA: Diagnosis not present

## 2018-12-20 DIAGNOSIS — N183 Chronic kidney disease, stage 3 (moderate): Secondary | ICD-10-CM | POA: Diagnosis not present

## 2019-03-01 DIAGNOSIS — N183 Chronic kidney disease, stage 3 (moderate): Secondary | ICD-10-CM | POA: Diagnosis not present

## 2019-03-04 DIAGNOSIS — N402 Nodular prostate without lower urinary tract symptoms: Secondary | ICD-10-CM | POA: Diagnosis not present

## 2019-03-09 DIAGNOSIS — M75102 Unspecified rotator cuff tear or rupture of left shoulder, not specified as traumatic: Secondary | ICD-10-CM | POA: Diagnosis not present

## 2019-03-09 DIAGNOSIS — R7303 Prediabetes: Secondary | ICD-10-CM | POA: Diagnosis not present

## 2019-03-09 DIAGNOSIS — N183 Chronic kidney disease, stage 3 (moderate): Secondary | ICD-10-CM | POA: Diagnosis not present

## 2019-03-09 DIAGNOSIS — N2581 Secondary hyperparathyroidism of renal origin: Secondary | ICD-10-CM | POA: Diagnosis not present

## 2019-03-09 DIAGNOSIS — I129 Hypertensive chronic kidney disease with stage 1 through stage 4 chronic kidney disease, or unspecified chronic kidney disease: Secondary | ICD-10-CM | POA: Diagnosis not present

## 2019-03-09 DIAGNOSIS — D631 Anemia in chronic kidney disease: Secondary | ICD-10-CM | POA: Diagnosis not present

## 2019-03-09 DIAGNOSIS — E7800 Pure hypercholesterolemia, unspecified: Secondary | ICD-10-CM | POA: Diagnosis not present

## 2019-03-09 DIAGNOSIS — Z6829 Body mass index (BMI) 29.0-29.9, adult: Secondary | ICD-10-CM | POA: Diagnosis not present

## 2019-03-18 DIAGNOSIS — E7800 Pure hypercholesterolemia, unspecified: Secondary | ICD-10-CM | POA: Diagnosis not present

## 2019-03-18 DIAGNOSIS — I10 Essential (primary) hypertension: Secondary | ICD-10-CM | POA: Diagnosis not present

## 2019-03-18 DIAGNOSIS — R7303 Prediabetes: Secondary | ICD-10-CM | POA: Diagnosis not present

## 2019-03-18 DIAGNOSIS — Z125 Encounter for screening for malignant neoplasm of prostate: Secondary | ICD-10-CM | POA: Diagnosis not present

## 2019-03-18 DIAGNOSIS — N402 Nodular prostate without lower urinary tract symptoms: Secondary | ICD-10-CM | POA: Diagnosis not present

## 2019-04-04 DIAGNOSIS — N183 Chronic kidney disease, stage 3 (moderate): Secondary | ICD-10-CM | POA: Diagnosis not present

## 2019-04-04 DIAGNOSIS — R7303 Prediabetes: Secondary | ICD-10-CM | POA: Diagnosis not present

## 2019-04-04 DIAGNOSIS — Z0000 Encounter for general adult medical examination without abnormal findings: Secondary | ICD-10-CM | POA: Diagnosis not present

## 2019-04-04 DIAGNOSIS — E7800 Pure hypercholesterolemia, unspecified: Secondary | ICD-10-CM | POA: Diagnosis not present

## 2019-04-04 DIAGNOSIS — J45909 Unspecified asthma, uncomplicated: Secondary | ICD-10-CM | POA: Diagnosis not present

## 2019-04-04 DIAGNOSIS — N401 Enlarged prostate with lower urinary tract symptoms: Secondary | ICD-10-CM | POA: Diagnosis not present

## 2019-04-04 DIAGNOSIS — K219 Gastro-esophageal reflux disease without esophagitis: Secondary | ICD-10-CM | POA: Diagnosis not present

## 2019-04-04 DIAGNOSIS — J309 Allergic rhinitis, unspecified: Secondary | ICD-10-CM | POA: Diagnosis not present

## 2019-04-04 DIAGNOSIS — I10 Essential (primary) hypertension: Secondary | ICD-10-CM | POA: Diagnosis not present

## 2019-05-02 DIAGNOSIS — Z23 Encounter for immunization: Secondary | ICD-10-CM | POA: Diagnosis not present

## 2019-06-15 DIAGNOSIS — Z1159 Encounter for screening for other viral diseases: Secondary | ICD-10-CM | POA: Diagnosis not present

## 2019-06-20 DIAGNOSIS — K640 First degree hemorrhoids: Secondary | ICD-10-CM | POA: Diagnosis not present

## 2019-06-20 DIAGNOSIS — D122 Benign neoplasm of ascending colon: Secondary | ICD-10-CM | POA: Diagnosis not present

## 2019-06-20 DIAGNOSIS — K219 Gastro-esophageal reflux disease without esophagitis: Secondary | ICD-10-CM | POA: Diagnosis not present

## 2019-06-20 DIAGNOSIS — K2900 Acute gastritis without bleeding: Secondary | ICD-10-CM | POA: Diagnosis not present

## 2019-06-20 DIAGNOSIS — R12 Heartburn: Secondary | ICD-10-CM | POA: Diagnosis not present

## 2019-06-20 DIAGNOSIS — K5730 Diverticulosis of large intestine without perforation or abscess without bleeding: Secondary | ICD-10-CM | POA: Diagnosis not present

## 2019-06-20 DIAGNOSIS — Z1211 Encounter for screening for malignant neoplasm of colon: Secondary | ICD-10-CM | POA: Diagnosis not present

## 2019-06-22 DIAGNOSIS — D122 Benign neoplasm of ascending colon: Secondary | ICD-10-CM | POA: Diagnosis not present

## 2019-09-20 DIAGNOSIS — N402 Nodular prostate without lower urinary tract symptoms: Secondary | ICD-10-CM | POA: Diagnosis not present

## 2019-10-21 DIAGNOSIS — Z85828 Personal history of other malignant neoplasm of skin: Secondary | ICD-10-CM | POA: Diagnosis not present

## 2019-10-21 DIAGNOSIS — L570 Actinic keratosis: Secondary | ICD-10-CM | POA: Diagnosis not present

## 2019-10-21 DIAGNOSIS — D485 Neoplasm of uncertain behavior of skin: Secondary | ICD-10-CM | POA: Diagnosis not present

## 2019-10-21 DIAGNOSIS — L821 Other seborrheic keratosis: Secondary | ICD-10-CM | POA: Diagnosis not present

## 2019-10-21 DIAGNOSIS — C44519 Basal cell carcinoma of skin of other part of trunk: Secondary | ICD-10-CM | POA: Diagnosis not present

## 2019-10-21 DIAGNOSIS — L905 Scar conditions and fibrosis of skin: Secondary | ICD-10-CM | POA: Diagnosis not present

## 2019-10-27 DIAGNOSIS — N400 Enlarged prostate without lower urinary tract symptoms: Secondary | ICD-10-CM | POA: Diagnosis not present

## 2019-10-27 DIAGNOSIS — D631 Anemia in chronic kidney disease: Secondary | ICD-10-CM | POA: Diagnosis not present

## 2019-10-27 DIAGNOSIS — M75102 Unspecified rotator cuff tear or rupture of left shoulder, not specified as traumatic: Secondary | ICD-10-CM | POA: Diagnosis not present

## 2019-10-27 DIAGNOSIS — N189 Chronic kidney disease, unspecified: Secondary | ICD-10-CM | POA: Diagnosis not present

## 2019-10-27 DIAGNOSIS — I129 Hypertensive chronic kidney disease with stage 1 through stage 4 chronic kidney disease, or unspecified chronic kidney disease: Secondary | ICD-10-CM | POA: Diagnosis not present

## 2019-10-27 DIAGNOSIS — N1831 Chronic kidney disease, stage 3a: Secondary | ICD-10-CM | POA: Diagnosis not present

## 2019-10-27 DIAGNOSIS — N2581 Secondary hyperparathyroidism of renal origin: Secondary | ICD-10-CM | POA: Diagnosis not present

## 2019-10-27 DIAGNOSIS — R7303 Prediabetes: Secondary | ICD-10-CM | POA: Diagnosis not present

## 2019-10-27 DIAGNOSIS — E7800 Pure hypercholesterolemia, unspecified: Secondary | ICD-10-CM | POA: Diagnosis not present

## 2019-11-10 DIAGNOSIS — R972 Elevated prostate specific antigen [PSA]: Secondary | ICD-10-CM | POA: Diagnosis not present

## 2019-11-10 DIAGNOSIS — D075 Carcinoma in situ of prostate: Secondary | ICD-10-CM | POA: Diagnosis not present

## 2019-11-24 DIAGNOSIS — L905 Scar conditions and fibrosis of skin: Secondary | ICD-10-CM | POA: Diagnosis not present

## 2019-11-24 DIAGNOSIS — C44519 Basal cell carcinoma of skin of other part of trunk: Secondary | ICD-10-CM | POA: Diagnosis not present

## 2019-11-25 DIAGNOSIS — N402 Nodular prostate without lower urinary tract symptoms: Secondary | ICD-10-CM | POA: Diagnosis not present

## 2020-02-08 DIAGNOSIS — N402 Nodular prostate without lower urinary tract symptoms: Secondary | ICD-10-CM | POA: Diagnosis not present

## 2020-02-22 DIAGNOSIS — R972 Elevated prostate specific antigen [PSA]: Secondary | ICD-10-CM | POA: Diagnosis not present

## 2020-02-22 DIAGNOSIS — N400 Enlarged prostate without lower urinary tract symptoms: Secondary | ICD-10-CM | POA: Diagnosis not present

## 2020-02-22 DIAGNOSIS — N4231 Prostatic intraepithelial neoplasia: Secondary | ICD-10-CM | POA: Diagnosis not present

## 2020-02-24 DIAGNOSIS — M5414 Radiculopathy, thoracic region: Secondary | ICD-10-CM | POA: Diagnosis not present

## 2020-02-24 DIAGNOSIS — M4722 Other spondylosis with radiculopathy, cervical region: Secondary | ICD-10-CM | POA: Diagnosis not present

## 2020-02-24 DIAGNOSIS — M9902 Segmental and somatic dysfunction of thoracic region: Secondary | ICD-10-CM | POA: Diagnosis not present

## 2020-02-24 DIAGNOSIS — M9903 Segmental and somatic dysfunction of lumbar region: Secondary | ICD-10-CM | POA: Diagnosis not present

## 2020-02-24 DIAGNOSIS — M5116 Intervertebral disc disorders with radiculopathy, lumbar region: Secondary | ICD-10-CM | POA: Diagnosis not present

## 2020-02-24 DIAGNOSIS — M9901 Segmental and somatic dysfunction of cervical region: Secondary | ICD-10-CM | POA: Diagnosis not present

## 2020-03-01 DIAGNOSIS — J9801 Acute bronchospasm: Secondary | ICD-10-CM | POA: Diagnosis not present

## 2020-03-01 DIAGNOSIS — J45909 Unspecified asthma, uncomplicated: Secondary | ICD-10-CM | POA: Diagnosis not present

## 2020-03-04 ENCOUNTER — Emergency Department (HOSPITAL_COMMUNITY)
Admission: EM | Admit: 2020-03-04 | Discharge: 2020-03-04 | Discharge status: Absent without leave | Payer: Medicare Other

## 2020-03-04 DIAGNOSIS — N1832 Chronic kidney disease, stage 3b: Secondary | ICD-10-CM | POA: Diagnosis present

## 2020-03-04 DIAGNOSIS — I700 Atherosclerosis of aorta: Secondary | ICD-10-CM | POA: Diagnosis not present

## 2020-03-04 DIAGNOSIS — I129 Hypertensive chronic kidney disease with stage 1 through stage 4 chronic kidney disease, or unspecified chronic kidney disease: Secondary | ICD-10-CM | POA: Diagnosis not present

## 2020-03-04 DIAGNOSIS — E7800 Pure hypercholesterolemia, unspecified: Secondary | ICD-10-CM | POA: Diagnosis present

## 2020-03-04 DIAGNOSIS — U071 COVID-19: Secondary | ICD-10-CM | POA: Diagnosis not present

## 2020-03-04 DIAGNOSIS — R110 Nausea: Secondary | ICD-10-CM | POA: Diagnosis not present

## 2020-03-04 DIAGNOSIS — J1282 Pneumonia due to coronavirus disease 2019: Secondary | ICD-10-CM | POA: Diagnosis present

## 2020-03-04 DIAGNOSIS — J441 Chronic obstructive pulmonary disease with (acute) exacerbation: Secondary | ICD-10-CM | POA: Diagnosis not present

## 2020-03-04 DIAGNOSIS — N179 Acute kidney failure, unspecified: Secondary | ICD-10-CM | POA: Diagnosis not present

## 2020-03-04 DIAGNOSIS — E871 Hypo-osmolality and hyponatremia: Secondary | ICD-10-CM | POA: Diagnosis not present

## 2020-03-04 DIAGNOSIS — Z79899 Other long term (current) drug therapy: Secondary | ICD-10-CM | POA: Diagnosis not present

## 2020-03-04 DIAGNOSIS — K5730 Diverticulosis of large intestine without perforation or abscess without bleeding: Secondary | ICD-10-CM | POA: Diagnosis not present

## 2020-03-04 DIAGNOSIS — Z888 Allergy status to other drugs, medicaments and biological substances status: Secondary | ICD-10-CM | POA: Diagnosis not present

## 2020-03-04 DIAGNOSIS — D696 Thrombocytopenia, unspecified: Secondary | ICD-10-CM | POA: Diagnosis present

## 2020-03-04 DIAGNOSIS — R509 Fever, unspecified: Secondary | ICD-10-CM | POA: Diagnosis not present

## 2020-03-04 DIAGNOSIS — R1110 Vomiting, unspecified: Secondary | ICD-10-CM | POA: Diagnosis not present

## 2020-03-04 DIAGNOSIS — N183 Chronic kidney disease, stage 3 unspecified: Secondary | ICD-10-CM | POA: Diagnosis not present

## 2020-03-04 DIAGNOSIS — R112 Nausea with vomiting, unspecified: Secondary | ICD-10-CM | POA: Diagnosis not present

## 2020-03-15 DIAGNOSIS — M1990 Unspecified osteoarthritis, unspecified site: Secondary | ICD-10-CM | POA: Diagnosis not present

## 2020-03-15 DIAGNOSIS — J1282 Pneumonia due to coronavirus disease 2019: Secondary | ICD-10-CM | POA: Diagnosis not present

## 2020-03-15 DIAGNOSIS — N1832 Chronic kidney disease, stage 3b: Secondary | ICD-10-CM | POA: Diagnosis not present

## 2020-03-15 DIAGNOSIS — U071 COVID-19: Secondary | ICD-10-CM | POA: Diagnosis not present

## 2020-03-15 DIAGNOSIS — I129 Hypertensive chronic kidney disease with stage 1 through stage 4 chronic kidney disease, or unspecified chronic kidney disease: Secondary | ICD-10-CM | POA: Diagnosis not present

## 2020-03-15 DIAGNOSIS — D696 Thrombocytopenia, unspecified: Secondary | ICD-10-CM | POA: Diagnosis not present

## 2020-03-15 DIAGNOSIS — E7800 Pure hypercholesterolemia, unspecified: Secondary | ICD-10-CM | POA: Diagnosis not present

## 2020-03-15 DIAGNOSIS — R630 Anorexia: Secondary | ICD-10-CM | POA: Diagnosis not present

## 2020-03-15 DIAGNOSIS — R110 Nausea: Secondary | ICD-10-CM | POA: Diagnosis not present

## 2020-03-20 DIAGNOSIS — N1832 Chronic kidney disease, stage 3b: Secondary | ICD-10-CM | POA: Diagnosis not present

## 2020-03-20 DIAGNOSIS — D696 Thrombocytopenia, unspecified: Secondary | ICD-10-CM | POA: Diagnosis not present

## 2020-03-20 DIAGNOSIS — I129 Hypertensive chronic kidney disease with stage 1 through stage 4 chronic kidney disease, or unspecified chronic kidney disease: Secondary | ICD-10-CM | POA: Diagnosis not present

## 2020-03-20 DIAGNOSIS — U071 COVID-19: Secondary | ICD-10-CM | POA: Diagnosis not present

## 2020-03-20 DIAGNOSIS — R630 Anorexia: Secondary | ICD-10-CM | POA: Diagnosis not present

## 2020-03-20 DIAGNOSIS — J1282 Pneumonia due to coronavirus disease 2019: Secondary | ICD-10-CM | POA: Diagnosis not present

## 2020-03-22 DIAGNOSIS — I129 Hypertensive chronic kidney disease with stage 1 through stage 4 chronic kidney disease, or unspecified chronic kidney disease: Secondary | ICD-10-CM | POA: Diagnosis not present

## 2020-03-22 DIAGNOSIS — D696 Thrombocytopenia, unspecified: Secondary | ICD-10-CM | POA: Diagnosis not present

## 2020-03-22 DIAGNOSIS — U071 COVID-19: Secondary | ICD-10-CM | POA: Diagnosis not present

## 2020-03-22 DIAGNOSIS — J1282 Pneumonia due to coronavirus disease 2019: Secondary | ICD-10-CM | POA: Diagnosis not present

## 2020-03-22 DIAGNOSIS — N1832 Chronic kidney disease, stage 3b: Secondary | ICD-10-CM | POA: Diagnosis not present

## 2020-03-22 DIAGNOSIS — R630 Anorexia: Secondary | ICD-10-CM | POA: Diagnosis not present

## 2020-03-23 DIAGNOSIS — D696 Thrombocytopenia, unspecified: Secondary | ICD-10-CM | POA: Diagnosis not present

## 2020-03-23 DIAGNOSIS — J1282 Pneumonia due to coronavirus disease 2019: Secondary | ICD-10-CM | POA: Diagnosis not present

## 2020-03-23 DIAGNOSIS — N1832 Chronic kidney disease, stage 3b: Secondary | ICD-10-CM | POA: Diagnosis not present

## 2020-03-23 DIAGNOSIS — R630 Anorexia: Secondary | ICD-10-CM | POA: Diagnosis not present

## 2020-03-23 DIAGNOSIS — U071 COVID-19: Secondary | ICD-10-CM | POA: Diagnosis not present

## 2020-03-23 DIAGNOSIS — I129 Hypertensive chronic kidney disease with stage 1 through stage 4 chronic kidney disease, or unspecified chronic kidney disease: Secondary | ICD-10-CM | POA: Diagnosis not present

## 2020-03-28 DIAGNOSIS — R630 Anorexia: Secondary | ICD-10-CM | POA: Diagnosis not present

## 2020-03-28 DIAGNOSIS — D696 Thrombocytopenia, unspecified: Secondary | ICD-10-CM | POA: Diagnosis not present

## 2020-03-28 DIAGNOSIS — J1282 Pneumonia due to coronavirus disease 2019: Secondary | ICD-10-CM | POA: Diagnosis not present

## 2020-03-28 DIAGNOSIS — I129 Hypertensive chronic kidney disease with stage 1 through stage 4 chronic kidney disease, or unspecified chronic kidney disease: Secondary | ICD-10-CM | POA: Diagnosis not present

## 2020-03-28 DIAGNOSIS — U071 COVID-19: Secondary | ICD-10-CM | POA: Diagnosis not present

## 2020-03-28 DIAGNOSIS — N1832 Chronic kidney disease, stage 3b: Secondary | ICD-10-CM | POA: Diagnosis not present

## 2020-03-29 DIAGNOSIS — J1282 Pneumonia due to coronavirus disease 2019: Secondary | ICD-10-CM | POA: Diagnosis not present

## 2020-03-29 DIAGNOSIS — E7800 Pure hypercholesterolemia, unspecified: Secondary | ICD-10-CM | POA: Diagnosis not present

## 2020-03-29 DIAGNOSIS — Z03818 Encounter for observation for suspected exposure to other biological agents ruled out: Secondary | ICD-10-CM | POA: Diagnosis not present

## 2020-03-29 DIAGNOSIS — D696 Thrombocytopenia, unspecified: Secondary | ICD-10-CM | POA: Diagnosis not present

## 2020-03-29 DIAGNOSIS — I129 Hypertensive chronic kidney disease with stage 1 through stage 4 chronic kidney disease, or unspecified chronic kidney disease: Secondary | ICD-10-CM | POA: Diagnosis not present

## 2020-03-29 DIAGNOSIS — R630 Anorexia: Secondary | ICD-10-CM | POA: Diagnosis not present

## 2020-03-29 DIAGNOSIS — U071 COVID-19: Secondary | ICD-10-CM | POA: Diagnosis not present

## 2020-03-29 DIAGNOSIS — I10 Essential (primary) hypertension: Secondary | ICD-10-CM | POA: Diagnosis not present

## 2020-03-29 DIAGNOSIS — N1832 Chronic kidney disease, stage 3b: Secondary | ICD-10-CM | POA: Diagnosis not present

## 2020-03-29 DIAGNOSIS — R7303 Prediabetes: Secondary | ICD-10-CM | POA: Diagnosis not present

## 2020-03-29 DIAGNOSIS — Z125 Encounter for screening for malignant neoplasm of prostate: Secondary | ICD-10-CM | POA: Diagnosis not present

## 2020-04-02 DIAGNOSIS — I129 Hypertensive chronic kidney disease with stage 1 through stage 4 chronic kidney disease, or unspecified chronic kidney disease: Secondary | ICD-10-CM | POA: Diagnosis not present

## 2020-04-02 DIAGNOSIS — R630 Anorexia: Secondary | ICD-10-CM | POA: Diagnosis not present

## 2020-04-02 DIAGNOSIS — J1282 Pneumonia due to coronavirus disease 2019: Secondary | ICD-10-CM | POA: Diagnosis not present

## 2020-04-02 DIAGNOSIS — U071 COVID-19: Secondary | ICD-10-CM | POA: Diagnosis not present

## 2020-04-02 DIAGNOSIS — D696 Thrombocytopenia, unspecified: Secondary | ICD-10-CM | POA: Diagnosis not present

## 2020-04-02 DIAGNOSIS — Z8616 Personal history of COVID-19: Secondary | ICD-10-CM | POA: Diagnosis not present

## 2020-04-02 DIAGNOSIS — N1832 Chronic kidney disease, stage 3b: Secondary | ICD-10-CM | POA: Diagnosis not present

## 2020-04-04 DIAGNOSIS — J1282 Pneumonia due to coronavirus disease 2019: Secondary | ICD-10-CM | POA: Diagnosis not present

## 2020-04-04 DIAGNOSIS — I129 Hypertensive chronic kidney disease with stage 1 through stage 4 chronic kidney disease, or unspecified chronic kidney disease: Secondary | ICD-10-CM | POA: Diagnosis not present

## 2020-04-04 DIAGNOSIS — R630 Anorexia: Secondary | ICD-10-CM | POA: Diagnosis not present

## 2020-04-04 DIAGNOSIS — U071 COVID-19: Secondary | ICD-10-CM | POA: Diagnosis not present

## 2020-04-04 DIAGNOSIS — D696 Thrombocytopenia, unspecified: Secondary | ICD-10-CM | POA: Diagnosis not present

## 2020-04-04 DIAGNOSIS — N1832 Chronic kidney disease, stage 3b: Secondary | ICD-10-CM | POA: Diagnosis not present

## 2020-04-09 DIAGNOSIS — M9902 Segmental and somatic dysfunction of thoracic region: Secondary | ICD-10-CM | POA: Diagnosis not present

## 2020-04-09 DIAGNOSIS — M5116 Intervertebral disc disorders with radiculopathy, lumbar region: Secondary | ICD-10-CM | POA: Diagnosis not present

## 2020-04-09 DIAGNOSIS — M9903 Segmental and somatic dysfunction of lumbar region: Secondary | ICD-10-CM | POA: Diagnosis not present

## 2020-04-09 DIAGNOSIS — M5414 Radiculopathy, thoracic region: Secondary | ICD-10-CM | POA: Diagnosis not present

## 2020-04-10 DIAGNOSIS — N1832 Chronic kidney disease, stage 3b: Secondary | ICD-10-CM | POA: Diagnosis not present

## 2020-04-10 DIAGNOSIS — R630 Anorexia: Secondary | ICD-10-CM | POA: Diagnosis not present

## 2020-04-10 DIAGNOSIS — U071 COVID-19: Secondary | ICD-10-CM | POA: Diagnosis not present

## 2020-04-10 DIAGNOSIS — J1282 Pneumonia due to coronavirus disease 2019: Secondary | ICD-10-CM | POA: Diagnosis not present

## 2020-04-10 DIAGNOSIS — D696 Thrombocytopenia, unspecified: Secondary | ICD-10-CM | POA: Diagnosis not present

## 2020-04-10 DIAGNOSIS — I129 Hypertensive chronic kidney disease with stage 1 through stage 4 chronic kidney disease, or unspecified chronic kidney disease: Secondary | ICD-10-CM | POA: Diagnosis not present

## 2020-04-11 DIAGNOSIS — M9903 Segmental and somatic dysfunction of lumbar region: Secondary | ICD-10-CM | POA: Diagnosis not present

## 2020-04-11 DIAGNOSIS — M9902 Segmental and somatic dysfunction of thoracic region: Secondary | ICD-10-CM | POA: Diagnosis not present

## 2020-04-11 DIAGNOSIS — M5414 Radiculopathy, thoracic region: Secondary | ICD-10-CM | POA: Diagnosis not present

## 2020-04-11 DIAGNOSIS — M5116 Intervertebral disc disorders with radiculopathy, lumbar region: Secondary | ICD-10-CM | POA: Diagnosis not present

## 2020-04-12 DIAGNOSIS — I129 Hypertensive chronic kidney disease with stage 1 through stage 4 chronic kidney disease, or unspecified chronic kidney disease: Secondary | ICD-10-CM | POA: Diagnosis not present

## 2020-04-12 DIAGNOSIS — R630 Anorexia: Secondary | ICD-10-CM | POA: Diagnosis not present

## 2020-04-12 DIAGNOSIS — U071 COVID-19: Secondary | ICD-10-CM | POA: Diagnosis not present

## 2020-04-12 DIAGNOSIS — N1832 Chronic kidney disease, stage 3b: Secondary | ICD-10-CM | POA: Diagnosis not present

## 2020-04-12 DIAGNOSIS — D696 Thrombocytopenia, unspecified: Secondary | ICD-10-CM | POA: Diagnosis not present

## 2020-04-12 DIAGNOSIS — J1282 Pneumonia due to coronavirus disease 2019: Secondary | ICD-10-CM | POA: Diagnosis not present

## 2020-04-14 DIAGNOSIS — J1282 Pneumonia due to coronavirus disease 2019: Secondary | ICD-10-CM | POA: Diagnosis not present

## 2020-04-14 DIAGNOSIS — I129 Hypertensive chronic kidney disease with stage 1 through stage 4 chronic kidney disease, or unspecified chronic kidney disease: Secondary | ICD-10-CM | POA: Diagnosis not present

## 2020-04-14 DIAGNOSIS — R110 Nausea: Secondary | ICD-10-CM | POA: Diagnosis not present

## 2020-04-14 DIAGNOSIS — U071 COVID-19: Secondary | ICD-10-CM | POA: Diagnosis not present

## 2020-04-14 DIAGNOSIS — R630 Anorexia: Secondary | ICD-10-CM | POA: Diagnosis not present

## 2020-04-14 DIAGNOSIS — E7800 Pure hypercholesterolemia, unspecified: Secondary | ICD-10-CM | POA: Diagnosis not present

## 2020-04-14 DIAGNOSIS — D696 Thrombocytopenia, unspecified: Secondary | ICD-10-CM | POA: Diagnosis not present

## 2020-04-14 DIAGNOSIS — N1832 Chronic kidney disease, stage 3b: Secondary | ICD-10-CM | POA: Diagnosis not present

## 2020-04-14 DIAGNOSIS — M1990 Unspecified osteoarthritis, unspecified site: Secondary | ICD-10-CM | POA: Diagnosis not present

## 2020-04-18 DIAGNOSIS — R630 Anorexia: Secondary | ICD-10-CM | POA: Diagnosis not present

## 2020-04-18 DIAGNOSIS — D696 Thrombocytopenia, unspecified: Secondary | ICD-10-CM | POA: Diagnosis not present

## 2020-04-18 DIAGNOSIS — U071 COVID-19: Secondary | ICD-10-CM | POA: Diagnosis not present

## 2020-04-18 DIAGNOSIS — I129 Hypertensive chronic kidney disease with stage 1 through stage 4 chronic kidney disease, or unspecified chronic kidney disease: Secondary | ICD-10-CM | POA: Diagnosis not present

## 2020-04-18 DIAGNOSIS — N1832 Chronic kidney disease, stage 3b: Secondary | ICD-10-CM | POA: Diagnosis not present

## 2020-04-18 DIAGNOSIS — J1282 Pneumonia due to coronavirus disease 2019: Secondary | ICD-10-CM | POA: Diagnosis not present

## 2020-04-24 DIAGNOSIS — M9902 Segmental and somatic dysfunction of thoracic region: Secondary | ICD-10-CM | POA: Diagnosis not present

## 2020-04-24 DIAGNOSIS — M5414 Radiculopathy, thoracic region: Secondary | ICD-10-CM | POA: Diagnosis not present

## 2020-04-24 DIAGNOSIS — M9903 Segmental and somatic dysfunction of lumbar region: Secondary | ICD-10-CM | POA: Diagnosis not present

## 2020-04-24 DIAGNOSIS — M5116 Intervertebral disc disorders with radiculopathy, lumbar region: Secondary | ICD-10-CM | POA: Diagnosis not present

## 2020-04-25 DIAGNOSIS — M5116 Intervertebral disc disorders with radiculopathy, lumbar region: Secondary | ICD-10-CM | POA: Diagnosis not present

## 2020-04-25 DIAGNOSIS — M5414 Radiculopathy, thoracic region: Secondary | ICD-10-CM | POA: Diagnosis not present

## 2020-04-25 DIAGNOSIS — M9902 Segmental and somatic dysfunction of thoracic region: Secondary | ICD-10-CM | POA: Diagnosis not present

## 2020-04-25 DIAGNOSIS — M9903 Segmental and somatic dysfunction of lumbar region: Secondary | ICD-10-CM | POA: Diagnosis not present

## 2020-04-25 DIAGNOSIS — M4722 Other spondylosis with radiculopathy, cervical region: Secondary | ICD-10-CM | POA: Diagnosis not present

## 2020-04-25 DIAGNOSIS — M9901 Segmental and somatic dysfunction of cervical region: Secondary | ICD-10-CM | POA: Diagnosis not present

## 2020-05-23 DIAGNOSIS — Z23 Encounter for immunization: Secondary | ICD-10-CM | POA: Diagnosis not present

## 2020-05-24 DIAGNOSIS — E7800 Pure hypercholesterolemia, unspecified: Secondary | ICD-10-CM | POA: Diagnosis not present

## 2020-05-24 DIAGNOSIS — I129 Hypertensive chronic kidney disease with stage 1 through stage 4 chronic kidney disease, or unspecified chronic kidney disease: Secondary | ICD-10-CM | POA: Diagnosis not present

## 2020-05-24 DIAGNOSIS — N1831 Chronic kidney disease, stage 3a: Secondary | ICD-10-CM | POA: Diagnosis not present

## 2020-05-24 DIAGNOSIS — Z6832 Body mass index (BMI) 32.0-32.9, adult: Secondary | ICD-10-CM | POA: Diagnosis not present

## 2020-05-24 DIAGNOSIS — N179 Acute kidney failure, unspecified: Secondary | ICD-10-CM | POA: Diagnosis not present

## 2020-05-24 DIAGNOSIS — R7303 Prediabetes: Secondary | ICD-10-CM | POA: Diagnosis not present

## 2020-05-24 DIAGNOSIS — M75102 Unspecified rotator cuff tear or rupture of left shoulder, not specified as traumatic: Secondary | ICD-10-CM | POA: Diagnosis not present

## 2020-05-30 DIAGNOSIS — M5414 Radiculopathy, thoracic region: Secondary | ICD-10-CM | POA: Diagnosis not present

## 2020-05-30 DIAGNOSIS — M5116 Intervertebral disc disorders with radiculopathy, lumbar region: Secondary | ICD-10-CM | POA: Diagnosis not present

## 2020-05-30 DIAGNOSIS — M9903 Segmental and somatic dysfunction of lumbar region: Secondary | ICD-10-CM | POA: Diagnosis not present

## 2020-05-30 DIAGNOSIS — M9902 Segmental and somatic dysfunction of thoracic region: Secondary | ICD-10-CM | POA: Diagnosis not present

## 2020-06-11 DIAGNOSIS — M5116 Intervertebral disc disorders with radiculopathy, lumbar region: Secondary | ICD-10-CM | POA: Diagnosis not present

## 2020-06-11 DIAGNOSIS — M5414 Radiculopathy, thoracic region: Secondary | ICD-10-CM | POA: Diagnosis not present

## 2020-06-11 DIAGNOSIS — M9903 Segmental and somatic dysfunction of lumbar region: Secondary | ICD-10-CM | POA: Diagnosis not present

## 2020-06-11 DIAGNOSIS — M9902 Segmental and somatic dysfunction of thoracic region: Secondary | ICD-10-CM | POA: Diagnosis not present

## 2020-06-15 DIAGNOSIS — M5116 Intervertebral disc disorders with radiculopathy, lumbar region: Secondary | ICD-10-CM | POA: Diagnosis not present

## 2020-06-15 DIAGNOSIS — M9903 Segmental and somatic dysfunction of lumbar region: Secondary | ICD-10-CM | POA: Diagnosis not present

## 2020-06-15 DIAGNOSIS — M9902 Segmental and somatic dysfunction of thoracic region: Secondary | ICD-10-CM | POA: Diagnosis not present

## 2020-06-15 DIAGNOSIS — M5414 Radiculopathy, thoracic region: Secondary | ICD-10-CM | POA: Diagnosis not present

## 2020-07-04 DIAGNOSIS — M5116 Intervertebral disc disorders with radiculopathy, lumbar region: Secondary | ICD-10-CM | POA: Diagnosis not present

## 2020-07-04 DIAGNOSIS — M9902 Segmental and somatic dysfunction of thoracic region: Secondary | ICD-10-CM | POA: Diagnosis not present

## 2020-07-04 DIAGNOSIS — M5414 Radiculopathy, thoracic region: Secondary | ICD-10-CM | POA: Diagnosis not present

## 2020-07-04 DIAGNOSIS — M9903 Segmental and somatic dysfunction of lumbar region: Secondary | ICD-10-CM | POA: Diagnosis not present

## 2020-07-23 DIAGNOSIS — M5414 Radiculopathy, thoracic region: Secondary | ICD-10-CM | POA: Diagnosis not present

## 2020-07-23 DIAGNOSIS — M9903 Segmental and somatic dysfunction of lumbar region: Secondary | ICD-10-CM | POA: Diagnosis not present

## 2020-07-23 DIAGNOSIS — M9902 Segmental and somatic dysfunction of thoracic region: Secondary | ICD-10-CM | POA: Diagnosis not present

## 2020-07-23 DIAGNOSIS — M5116 Intervertebral disc disorders with radiculopathy, lumbar region: Secondary | ICD-10-CM | POA: Diagnosis not present

## 2020-07-25 DIAGNOSIS — M5414 Radiculopathy, thoracic region: Secondary | ICD-10-CM | POA: Diagnosis not present

## 2020-07-25 DIAGNOSIS — M5116 Intervertebral disc disorders with radiculopathy, lumbar region: Secondary | ICD-10-CM | POA: Diagnosis not present

## 2020-07-25 DIAGNOSIS — M9903 Segmental and somatic dysfunction of lumbar region: Secondary | ICD-10-CM | POA: Diagnosis not present

## 2020-07-25 DIAGNOSIS — M9902 Segmental and somatic dysfunction of thoracic region: Secondary | ICD-10-CM | POA: Diagnosis not present

## 2020-07-31 DIAGNOSIS — H2513 Age-related nuclear cataract, bilateral: Secondary | ICD-10-CM | POA: Diagnosis not present

## 2020-08-02 DIAGNOSIS — R8271 Bacteriuria: Secondary | ICD-10-CM | POA: Diagnosis not present

## 2020-08-02 DIAGNOSIS — R3121 Asymptomatic microscopic hematuria: Secondary | ICD-10-CM | POA: Diagnosis not present

## 2020-08-02 DIAGNOSIS — R1084 Generalized abdominal pain: Secondary | ICD-10-CM | POA: Diagnosis not present

## 2020-08-05 DIAGNOSIS — Z23 Encounter for immunization: Secondary | ICD-10-CM | POA: Diagnosis not present

## 2020-08-20 DIAGNOSIS — R972 Elevated prostate specific antigen [PSA]: Secondary | ICD-10-CM | POA: Diagnosis not present

## 2020-08-23 DIAGNOSIS — R7303 Prediabetes: Secondary | ICD-10-CM | POA: Diagnosis not present

## 2020-08-23 DIAGNOSIS — I129 Hypertensive chronic kidney disease with stage 1 through stage 4 chronic kidney disease, or unspecified chronic kidney disease: Secondary | ICD-10-CM | POA: Diagnosis not present

## 2020-08-23 DIAGNOSIS — E7800 Pure hypercholesterolemia, unspecified: Secondary | ICD-10-CM | POA: Diagnosis not present

## 2020-08-23 DIAGNOSIS — Z6829 Body mass index (BMI) 29.0-29.9, adult: Secondary | ICD-10-CM | POA: Diagnosis not present

## 2020-08-23 DIAGNOSIS — M75102 Unspecified rotator cuff tear or rupture of left shoulder, not specified as traumatic: Secondary | ICD-10-CM | POA: Diagnosis not present

## 2020-08-23 DIAGNOSIS — N1831 Chronic kidney disease, stage 3a: Secondary | ICD-10-CM | POA: Diagnosis not present

## 2020-08-23 DIAGNOSIS — N179 Acute kidney failure, unspecified: Secondary | ICD-10-CM | POA: Diagnosis not present

## 2020-08-29 DIAGNOSIS — R972 Elevated prostate specific antigen [PSA]: Secondary | ICD-10-CM | POA: Diagnosis not present

## 2020-08-29 DIAGNOSIS — N400 Enlarged prostate without lower urinary tract symptoms: Secondary | ICD-10-CM | POA: Diagnosis not present

## 2020-08-31 ENCOUNTER — Other Ambulatory Visit: Payer: Self-pay | Admitting: Urology

## 2020-08-31 DIAGNOSIS — R972 Elevated prostate specific antigen [PSA]: Secondary | ICD-10-CM

## 2020-09-02 DIAGNOSIS — Z23 Encounter for immunization: Secondary | ICD-10-CM | POA: Diagnosis not present

## 2020-09-22 ENCOUNTER — Ambulatory Visit
Admission: RE | Admit: 2020-09-22 | Discharge: 2020-09-22 | Disposition: A | Discharge status: Home / Self Care | Payer: Medicare Other | Source: Ambulatory Visit | Attending: Urology | Admitting: Urology

## 2020-09-22 ENCOUNTER — Other Ambulatory Visit: Payer: Self-pay

## 2020-09-22 DIAGNOSIS — R972 Elevated prostate specific antigen [PSA]: Secondary | ICD-10-CM

## 2020-09-22 MED ORDER — GADOBENATE DIMEGLUMINE 529 MG/ML IV SOLN
18.0000 mL | Freq: Once | INTRAVENOUS | Status: AC | PRN
  Administered 2020-09-22: 18 mL via INTRAVENOUS

## 2020-09-27 DIAGNOSIS — N400 Enlarged prostate without lower urinary tract symptoms: Secondary | ICD-10-CM | POA: Diagnosis not present

## 2020-09-27 DIAGNOSIS — R972 Elevated prostate specific antigen [PSA]: Secondary | ICD-10-CM | POA: Diagnosis not present

## 2020-10-02 DIAGNOSIS — M5116 Intervertebral disc disorders with radiculopathy, lumbar region: Secondary | ICD-10-CM | POA: Diagnosis not present

## 2020-10-02 DIAGNOSIS — M9903 Segmental and somatic dysfunction of lumbar region: Secondary | ICD-10-CM | POA: Diagnosis not present

## 2020-10-02 DIAGNOSIS — M5414 Radiculopathy, thoracic region: Secondary | ICD-10-CM | POA: Diagnosis not present

## 2020-10-02 DIAGNOSIS — M9902 Segmental and somatic dysfunction of thoracic region: Secondary | ICD-10-CM | POA: Diagnosis not present

## 2020-10-08 DIAGNOSIS — M9903 Segmental and somatic dysfunction of lumbar region: Secondary | ICD-10-CM | POA: Diagnosis not present

## 2020-10-08 DIAGNOSIS — M5414 Radiculopathy, thoracic region: Secondary | ICD-10-CM | POA: Diagnosis not present

## 2020-10-08 DIAGNOSIS — M5116 Intervertebral disc disorders with radiculopathy, lumbar region: Secondary | ICD-10-CM | POA: Diagnosis not present

## 2020-10-08 DIAGNOSIS — M9902 Segmental and somatic dysfunction of thoracic region: Secondary | ICD-10-CM | POA: Diagnosis not present

## 2020-10-12 DIAGNOSIS — M5414 Radiculopathy, thoracic region: Secondary | ICD-10-CM | POA: Diagnosis not present

## 2020-10-12 DIAGNOSIS — M9903 Segmental and somatic dysfunction of lumbar region: Secondary | ICD-10-CM | POA: Diagnosis not present

## 2020-10-12 DIAGNOSIS — M5116 Intervertebral disc disorders with radiculopathy, lumbar region: Secondary | ICD-10-CM | POA: Diagnosis not present

## 2020-10-12 DIAGNOSIS — M9902 Segmental and somatic dysfunction of thoracic region: Secondary | ICD-10-CM | POA: Diagnosis not present

## 2020-10-26 DIAGNOSIS — M9903 Segmental and somatic dysfunction of lumbar region: Secondary | ICD-10-CM | POA: Diagnosis not present

## 2020-10-26 DIAGNOSIS — M5414 Radiculopathy, thoracic region: Secondary | ICD-10-CM | POA: Diagnosis not present

## 2020-10-26 DIAGNOSIS — M5116 Intervertebral disc disorders with radiculopathy, lumbar region: Secondary | ICD-10-CM | POA: Diagnosis not present

## 2020-10-26 DIAGNOSIS — M9902 Segmental and somatic dysfunction of thoracic region: Secondary | ICD-10-CM | POA: Diagnosis not present

## 2020-10-30 DIAGNOSIS — M5414 Radiculopathy, thoracic region: Secondary | ICD-10-CM | POA: Diagnosis not present

## 2020-10-30 DIAGNOSIS — M5116 Intervertebral disc disorders with radiculopathy, lumbar region: Secondary | ICD-10-CM | POA: Diagnosis not present

## 2020-10-30 DIAGNOSIS — M9902 Segmental and somatic dysfunction of thoracic region: Secondary | ICD-10-CM | POA: Diagnosis not present

## 2020-10-30 DIAGNOSIS — M9903 Segmental and somatic dysfunction of lumbar region: Secondary | ICD-10-CM | POA: Diagnosis not present

## 2020-11-15 DIAGNOSIS — I129 Hypertensive chronic kidney disease with stage 1 through stage 4 chronic kidney disease, or unspecified chronic kidney disease: Secondary | ICD-10-CM | POA: Diagnosis not present

## 2020-11-15 DIAGNOSIS — N179 Acute kidney failure, unspecified: Secondary | ICD-10-CM | POA: Diagnosis not present

## 2020-11-15 DIAGNOSIS — E7800 Pure hypercholesterolemia, unspecified: Secondary | ICD-10-CM | POA: Diagnosis not present

## 2020-11-15 DIAGNOSIS — M75102 Unspecified rotator cuff tear or rupture of left shoulder, not specified as traumatic: Secondary | ICD-10-CM | POA: Diagnosis not present

## 2020-11-15 DIAGNOSIS — R7303 Prediabetes: Secondary | ICD-10-CM | POA: Diagnosis not present

## 2020-11-15 DIAGNOSIS — Z6829 Body mass index (BMI) 29.0-29.9, adult: Secondary | ICD-10-CM | POA: Diagnosis not present

## 2020-11-15 DIAGNOSIS — N1831 Chronic kidney disease, stage 3a: Secondary | ICD-10-CM | POA: Diagnosis not present

## 2020-11-16 DIAGNOSIS — L918 Other hypertrophic disorders of the skin: Secondary | ICD-10-CM | POA: Diagnosis not present

## 2020-11-16 DIAGNOSIS — L905 Scar conditions and fibrosis of skin: Secondary | ICD-10-CM | POA: Diagnosis not present

## 2020-11-16 DIAGNOSIS — D485 Neoplasm of uncertain behavior of skin: Secondary | ICD-10-CM | POA: Diagnosis not present

## 2020-11-16 DIAGNOSIS — C44612 Basal cell carcinoma of skin of right upper limb, including shoulder: Secondary | ICD-10-CM | POA: Diagnosis not present

## 2020-11-16 DIAGNOSIS — Z85828 Personal history of other malignant neoplasm of skin: Secondary | ICD-10-CM | POA: Diagnosis not present

## 2020-11-20 DIAGNOSIS — R748 Abnormal levels of other serum enzymes: Secondary | ICD-10-CM | POA: Diagnosis not present

## 2020-11-21 DIAGNOSIS — M5414 Radiculopathy, thoracic region: Secondary | ICD-10-CM | POA: Diagnosis not present

## 2020-11-21 DIAGNOSIS — M9903 Segmental and somatic dysfunction of lumbar region: Secondary | ICD-10-CM | POA: Diagnosis not present

## 2020-11-21 DIAGNOSIS — M9902 Segmental and somatic dysfunction of thoracic region: Secondary | ICD-10-CM | POA: Diagnosis not present

## 2020-11-21 DIAGNOSIS — M5116 Intervertebral disc disorders with radiculopathy, lumbar region: Secondary | ICD-10-CM | POA: Diagnosis not present

## 2020-11-26 DIAGNOSIS — M9902 Segmental and somatic dysfunction of thoracic region: Secondary | ICD-10-CM | POA: Diagnosis not present

## 2020-11-26 DIAGNOSIS — M5116 Intervertebral disc disorders with radiculopathy, lumbar region: Secondary | ICD-10-CM | POA: Diagnosis not present

## 2020-11-26 DIAGNOSIS — M9903 Segmental and somatic dysfunction of lumbar region: Secondary | ICD-10-CM | POA: Diagnosis not present

## 2020-11-26 DIAGNOSIS — M5414 Radiculopathy, thoracic region: Secondary | ICD-10-CM | POA: Diagnosis not present

## 2020-11-29 DIAGNOSIS — R748 Abnormal levels of other serum enzymes: Secondary | ICD-10-CM | POA: Diagnosis not present

## 2020-11-30 DIAGNOSIS — R7989 Other specified abnormal findings of blood chemistry: Secondary | ICD-10-CM | POA: Diagnosis not present

## 2020-12-05 DIAGNOSIS — R748 Abnormal levels of other serum enzymes: Secondary | ICD-10-CM | POA: Diagnosis not present

## 2020-12-07 DIAGNOSIS — C44612 Basal cell carcinoma of skin of right upper limb, including shoulder: Secondary | ICD-10-CM | POA: Diagnosis not present

## 2020-12-07 DIAGNOSIS — L905 Scar conditions and fibrosis of skin: Secondary | ICD-10-CM | POA: Diagnosis not present

## 2020-12-21 DIAGNOSIS — M5414 Radiculopathy, thoracic region: Secondary | ICD-10-CM | POA: Diagnosis not present

## 2020-12-21 DIAGNOSIS — M5116 Intervertebral disc disorders with radiculopathy, lumbar region: Secondary | ICD-10-CM | POA: Diagnosis not present

## 2020-12-21 DIAGNOSIS — M9903 Segmental and somatic dysfunction of lumbar region: Secondary | ICD-10-CM | POA: Diagnosis not present

## 2020-12-21 DIAGNOSIS — M9902 Segmental and somatic dysfunction of thoracic region: Secondary | ICD-10-CM | POA: Diagnosis not present

## 2020-12-25 DIAGNOSIS — R748 Abnormal levels of other serum enzymes: Secondary | ICD-10-CM | POA: Diagnosis not present

## 2020-12-28 DIAGNOSIS — M5116 Intervertebral disc disorders with radiculopathy, lumbar region: Secondary | ICD-10-CM | POA: Diagnosis not present

## 2020-12-28 DIAGNOSIS — M9902 Segmental and somatic dysfunction of thoracic region: Secondary | ICD-10-CM | POA: Diagnosis not present

## 2020-12-28 DIAGNOSIS — M5414 Radiculopathy, thoracic region: Secondary | ICD-10-CM | POA: Diagnosis not present

## 2020-12-28 DIAGNOSIS — M9903 Segmental and somatic dysfunction of lumbar region: Secondary | ICD-10-CM | POA: Diagnosis not present

## 2021-01-21 DIAGNOSIS — M9902 Segmental and somatic dysfunction of thoracic region: Secondary | ICD-10-CM | POA: Diagnosis not present

## 2021-01-21 DIAGNOSIS — M9903 Segmental and somatic dysfunction of lumbar region: Secondary | ICD-10-CM | POA: Diagnosis not present

## 2021-01-21 DIAGNOSIS — M5116 Intervertebral disc disorders with radiculopathy, lumbar region: Secondary | ICD-10-CM | POA: Diagnosis not present

## 2021-01-21 DIAGNOSIS — M5414 Radiculopathy, thoracic region: Secondary | ICD-10-CM | POA: Diagnosis not present

## 2021-01-28 DIAGNOSIS — M5414 Radiculopathy, thoracic region: Secondary | ICD-10-CM | POA: Diagnosis not present

## 2021-01-28 DIAGNOSIS — M5116 Intervertebral disc disorders with radiculopathy, lumbar region: Secondary | ICD-10-CM | POA: Diagnosis not present

## 2021-01-28 DIAGNOSIS — M9903 Segmental and somatic dysfunction of lumbar region: Secondary | ICD-10-CM | POA: Diagnosis not present

## 2021-01-28 DIAGNOSIS — M9902 Segmental and somatic dysfunction of thoracic region: Secondary | ICD-10-CM | POA: Diagnosis not present

## 2021-03-04 DIAGNOSIS — H353131 Nonexudative age-related macular degeneration, bilateral, early dry stage: Secondary | ICD-10-CM | POA: Diagnosis not present

## 2021-03-04 DIAGNOSIS — H2513 Age-related nuclear cataract, bilateral: Secondary | ICD-10-CM | POA: Diagnosis not present

## 2021-03-12 DIAGNOSIS — L923 Foreign body granuloma of the skin and subcutaneous tissue: Secondary | ICD-10-CM | POA: Diagnosis not present

## 2021-03-21 DIAGNOSIS — R972 Elevated prostate specific antigen [PSA]: Secondary | ICD-10-CM | POA: Diagnosis not present

## 2021-03-25 DIAGNOSIS — R972 Elevated prostate specific antigen [PSA]: Secondary | ICD-10-CM | POA: Diagnosis not present

## 2021-03-25 DIAGNOSIS — R3912 Poor urinary stream: Secondary | ICD-10-CM | POA: Diagnosis not present

## 2021-03-25 DIAGNOSIS — N401 Enlarged prostate with lower urinary tract symptoms: Secondary | ICD-10-CM | POA: Diagnosis not present

## 2021-03-25 DIAGNOSIS — N5201 Erectile dysfunction due to arterial insufficiency: Secondary | ICD-10-CM | POA: Diagnosis not present

## 2021-04-12 DIAGNOSIS — M75102 Unspecified rotator cuff tear or rupture of left shoulder, not specified as traumatic: Secondary | ICD-10-CM | POA: Diagnosis not present

## 2021-04-12 DIAGNOSIS — N1831 Chronic kidney disease, stage 3a: Secondary | ICD-10-CM | POA: Diagnosis not present

## 2021-04-12 DIAGNOSIS — N179 Acute kidney failure, unspecified: Secondary | ICD-10-CM | POA: Diagnosis not present

## 2021-04-12 DIAGNOSIS — B159 Hepatitis A without hepatic coma: Secondary | ICD-10-CM | POA: Diagnosis not present

## 2021-04-12 DIAGNOSIS — R7303 Prediabetes: Secondary | ICD-10-CM | POA: Diagnosis not present

## 2021-04-12 DIAGNOSIS — I129 Hypertensive chronic kidney disease with stage 1 through stage 4 chronic kidney disease, or unspecified chronic kidney disease: Secondary | ICD-10-CM | POA: Diagnosis not present

## 2021-04-12 DIAGNOSIS — E7800 Pure hypercholesterolemia, unspecified: Secondary | ICD-10-CM | POA: Diagnosis not present

## 2021-04-12 DIAGNOSIS — Z6829 Body mass index (BMI) 29.0-29.9, adult: Secondary | ICD-10-CM | POA: Diagnosis not present

## 2021-04-12 DIAGNOSIS — N2581 Secondary hyperparathyroidism of renal origin: Secondary | ICD-10-CM | POA: Diagnosis not present

## 2021-04-22 DIAGNOSIS — R945 Abnormal results of liver function studies: Secondary | ICD-10-CM | POA: Diagnosis not present

## 2021-05-27 DIAGNOSIS — Z23 Encounter for immunization: Secondary | ICD-10-CM | POA: Diagnosis not present

## 2022-03-05 ENCOUNTER — Other Ambulatory Visit: Payer: Self-pay | Admitting: Internal Medicine

## 2022-03-05 DIAGNOSIS — E7800 Pure hypercholesterolemia, unspecified: Secondary | ICD-10-CM

## 2022-03-21 ENCOUNTER — Ambulatory Visit
Admission: RE | Admit: 2022-03-21 | Discharge: 2022-03-21 | Disposition: A | Discharge status: Home / Self Care | Payer: No Typology Code available for payment source | Source: Ambulatory Visit | Attending: Internal Medicine | Admitting: Internal Medicine

## 2022-03-21 DIAGNOSIS — E7800 Pure hypercholesterolemia, unspecified: Secondary | ICD-10-CM

## 2022-09-22 ENCOUNTER — Emergency Department (HOSPITAL_COMMUNITY)
Admission: EM | Admit: 2022-09-22 | Discharge: 2022-09-22 | Disposition: A | Discharge status: Home / Self Care | Payer: Medicare Other | Attending: Emergency Medicine | Admitting: Emergency Medicine

## 2022-09-22 ENCOUNTER — Encounter (HOSPITAL_COMMUNITY): Payer: Self-pay | Admitting: Emergency Medicine

## 2022-09-22 ENCOUNTER — Emergency Department (HOSPITAL_COMMUNITY): Payer: Medicare Other

## 2022-09-22 ENCOUNTER — Other Ambulatory Visit: Payer: Self-pay

## 2022-09-22 DIAGNOSIS — D72829 Elevated white blood cell count, unspecified: Secondary | ICD-10-CM | POA: Insufficient documentation

## 2022-09-22 DIAGNOSIS — Z79899 Other long term (current) drug therapy: Secondary | ICD-10-CM | POA: Diagnosis not present

## 2022-09-22 DIAGNOSIS — R739 Hyperglycemia, unspecified: Secondary | ICD-10-CM | POA: Insufficient documentation

## 2022-09-22 DIAGNOSIS — Z85828 Personal history of other malignant neoplasm of skin: Secondary | ICD-10-CM | POA: Diagnosis not present

## 2022-09-22 DIAGNOSIS — R140 Abdominal distension (gaseous): Secondary | ICD-10-CM | POA: Diagnosis not present

## 2022-09-22 DIAGNOSIS — R1033 Periumbilical pain: Secondary | ICD-10-CM | POA: Diagnosis not present

## 2022-09-22 DIAGNOSIS — R1013 Epigastric pain: Secondary | ICD-10-CM

## 2022-09-22 DIAGNOSIS — I10 Essential (primary) hypertension: Secondary | ICD-10-CM | POA: Diagnosis not present

## 2022-09-22 DIAGNOSIS — Z91010 Allergy to peanuts: Secondary | ICD-10-CM | POA: Diagnosis not present

## 2022-09-22 LAB — COMPREHENSIVE METABOLIC PANEL
ALT: 15 U/L (ref 0–44)
AST: 20 U/L (ref 15–41)
Albumin: 3.7 g/dL (ref 3.5–5.0)
Alkaline Phosphatase: 97 U/L (ref 38–126)
Anion gap: 13 (ref 5–15)
BUN: 43 mg/dL — ABNORMAL HIGH (ref 8–23)
CO2: 23 mmol/L (ref 22–32)
Calcium: 9.2 mg/dL (ref 8.9–10.3)
Chloride: 101 mmol/L (ref 98–111)
Creatinine, Ser: 2.96 mg/dL — ABNORMAL HIGH (ref 0.61–1.24)
GFR, Estimated: 22 mL/min — ABNORMAL LOW (ref >60)
Glucose, Bld: 162 mg/dL — ABNORMAL HIGH (ref 70–99)
Potassium: 4 mmol/L (ref 3.5–5.1)
Sodium: 137 mmol/L (ref 135–145)
Total Bilirubin: 0.6 mg/dL (ref 0.3–1.2)
Total Protein: 6.7 g/dL (ref 6.5–8.1)

## 2022-09-22 LAB — CBC
HCT: 42.8 % (ref 39.0–52.0)
Hemoglobin: 14.1 g/dL (ref 13.0–17.0)
MCH: 31.3 pg (ref 26.0–34.0)
MCHC: 32.9 g/dL (ref 30.0–36.0)
MCV: 94.9 fL (ref 80.0–100.0)
Platelets: 152 10*3/uL (ref 150–400)
RBC: 4.51 MIL/uL (ref 4.22–5.81)
RDW: 13.2 % (ref 11.5–15.5)
WBC: 10.7 10*3/uL — ABNORMAL HIGH (ref 4.0–10.5)
nRBC: 0 % (ref 0.0–0.2)

## 2022-09-22 LAB — URINALYSIS, ROUTINE W REFLEX MICROSCOPIC
Bacteria, UA: NONE SEEN
Bilirubin Urine: NEGATIVE
Glucose, UA: NEGATIVE mg/dL
Hgb urine dipstick: NEGATIVE
Ketones, ur: NEGATIVE mg/dL
Leukocytes,Ua: NEGATIVE
Nitrite: NEGATIVE
Protein, ur: NEGATIVE mg/dL
Specific Gravity, Urine: 1.016 (ref 1.005–1.030)
pH: 5 (ref 5.0–8.0)

## 2022-09-22 LAB — LIPASE, BLOOD: Lipase: 37 U/L (ref 11–51)

## 2022-09-22 MED ORDER — FENTANYL CITRATE PF 50 MCG/ML IJ SOSY
50.0000 ug | PREFILLED_SYRINGE | Freq: Once | INTRAMUSCULAR | Status: AC
  Administered 2022-09-22: 50 ug via INTRAVENOUS
  Filled 2022-09-22: qty 1

## 2022-09-22 MED ORDER — MAALOX MAX 400-400-40 MG/5ML PO SUSP: 10.0000 mL | Freq: Four times a day (QID) | ORAL | 0 refills | Status: DC | PRN

## 2022-09-22 MED ORDER — LIDOCAINE VISCOUS HCL 2 % MT SOLN: 10.0000 mL | Freq: Four times a day (QID) | OROMUCOSAL | 0 refills | Status: DC | PRN

## 2022-09-22 MED ORDER — ONDANSETRON 4 MG PO TBDP: 4.0000 mg | ORAL_TABLET | Freq: Three times a day (TID) | ORAL | 0 refills | Status: AC | PRN

## 2022-09-22 MED ORDER — LIDOCAINE VISCOUS HCL 2 % MT SOLN
15.0000 mL | Freq: Once | OROMUCOSAL | Status: AC
  Administered 2022-09-22: 15 mL via ORAL
  Filled 2022-09-22: qty 15

## 2022-09-22 MED ORDER — SODIUM CHLORIDE 0.9 % IV BOLUS
1000.0000 mL | Freq: Once | INTRAVENOUS | Status: AC
  Administered 2022-09-22: 1000 mL via INTRAVENOUS

## 2022-09-22 MED ORDER — ALUM & MAG HYDROXIDE-SIMETH 200-200-20 MG/5ML PO SUSP
30.0000 mL | Freq: Once | ORAL | Status: AC
  Administered 2022-09-22: 30 mL via ORAL
  Filled 2022-09-22: qty 30

## 2022-09-22 NOTE — ED Provider Notes (Signed)
[facility] Provider Note  CSN: [id number] Arrival date & time: 09/22/22 [id number]  Chief Complaint(s) Abdominal Pain  HPI Donald Vaughn is a 72 y.o. male with a past medical history listed below who presents to the emergency department with several hours of periumbilical and epigastric abdominal pain described as aching sensation.  Patient has associated nausea without emesis.  Patient thought this was initially heartburn but the pain is more severe than usual heartburn.  He denied any diarrhea.  No suspicious food intake.  States that he feels bloated.  Had a bowel movement this evening and is still passing gas from below.  Denies any last 30 years.  No no sick contacts.  The history is provided by the patient.    Past Medical History Past Medical History:  Diagnosis Date   Alcohol abuse    hx   Arthritis    BCC (basal cell carcinoma of skin)    Benign prostatic hypertrophy    hx   BPH (benign prostatic hyperplasia)    BPH (benign prostatic hyperplasia)    Chest pain, atypical    Diverticulitis    Generalized headaches    GERD (gastroesophageal reflux disease)    with history of esophagitis   Hearing loss    Hypercholesterolemia    Hypertension    Leukopenia    mild   Obese    Pre-diabetes    Rectal bleeding    Skin cancer    Tinnitus    chronic   Patient Active Problem List   Diagnosis Date Noted   PALPITATIONS 06/19/2009   CHEST PAIN-UNSPECIFIED 06/19/2009   DYSLIPIDEMIA 04/29/2009   CHEST PAIN, ATYPICAL 04/29/2009   ALCOHOL ABUSE, HX OF 04/29/2009   HYPERCHOLESTEROLEMIA 08/31/2008   BENIGN PROSTATIC HYPERTROPHY, HX OF 08/31/2008   Home Medication(s) Prior to Admission medications   Medication Sig Start Date End Date Taking? Authorizing Provider  alum & mag hydroxide-simeth (MAALOX MAX) 400-400-40 MG/5ML suspension Take 10 mLs by mouth every 6 (six) hours as needed for indigestion. 09/22/22  Yes Drishti Pepperman, Grayce Sessions, MD  lidocaine (XYLOCAINE) 2 % solution Use as directed 10 mLs in the mouth or throat every 6 (six) hours as needed (stomach pain). 09/22/22  Yes Lorine Iannaccone, Grayce Sessions, MD  ondansetron (ZOFRAN-ODT) 4 MG disintegrating tablet Take 1 tablet (4 mg total) by mouth every 8 (eight) hours as needed for up to 3 days for nausea or vomiting. 09/22/22 09/25/22 Yes Yoshiko Keleher, Grayce Sessions, MD  CALCIUM PO Take 1 tablet by mouth daily.    [provider]  famotidine (PEPCID) 20 MG tablet Take 1 tablet (20 mg total) by mouth 2 (two) times daily. 12/25/16   Rolland Porter, MD  Hyaluronic Acid-Vitamin C (HYALURONIC ACID PO) Take 1 tablet by mouth daily.    [provider]  losartan-hydrochlorothiazide (HYZAAR) 100-12.5 MG tablet Take 0.5 tablets by mouth daily.    [provider]  Multiple Vitamins-Minerals (PRESERVISION AREDS PO) Take 1 tablet by mouth daily.    [provider]  predniSONE (DELTASONE) 20 MG tablet Take 3 po QD x 3d , then 2 po QD x 3d then 1 po QD x 3d 12/25/16   Rolland Porter, MD  Allergies Cats claw [uncaria tomentosa (cats claw)], Codeine, Oxycodone-aspirin, Peanut butter flavor, Septra [sulfamethoxazole-trimethoprim], and Sulfonamide derivatives  Review of Systems Review of Systems As noted in HPI  Physical Exam Vital Signs  I have reviewed the triage vital signs BP (!) 158/70   Pulse 77   Temp 98.2 F (36.8 C) (Oral)   Resp 18   Ht 5' 8"$  (1.727 m)   Wt 93.9 kg   SpO2 98%   BMI 31.47 kg/m   Physical Exam Vitals reviewed.  Constitutional:      General: He is not in acute distress.    Appearance: He is well-developed. He is not diaphoretic.  HENT:     Head: Normocephalic and atraumatic.     Right Ear: External ear normal.     Left Ear: External ear normal.     Nose: Nose normal.     Mouth/Throat:     Mouth:  Mucous membranes are moist.  Eyes:     General: No scleral icterus.    Conjunctiva/sclera: Conjunctivae normal.  Neck:     Trachea: Phonation normal.  Cardiovascular:     Rate and Rhythm: Normal rate and regular rhythm.  Pulmonary:     Effort: Pulmonary effort is normal. No respiratory distress.     Breath sounds: No stridor.  Abdominal:     General: There is distension.     Tenderness: There is abdominal tenderness in the epigastric area and periumbilical area. There is no guarding or rebound.  Musculoskeletal:        General: Normal range of motion.     Cervical back: Normal range of motion.  Neurological:     Mental Status: He is alert and oriented to person, place, and time.  Psychiatric:        Behavior: Behavior normal.     ED Results and Treatments Labs (all labs ordered are listed, but only abnormal results are displayed) Labs Reviewed  COMPREHENSIVE METABOLIC PANEL - Abnormal; Notable for the following components:      Result Value   Glucose, Bld 162 (*)    BUN 43 (*)    Creatinine, Ser 2.96 (*)    GFR, Estimated 22 (*)    All other components within normal limits  CBC - Abnormal; Notable for the following components:   WBC 10.7 (*)    All other components within normal limits  LIPASE, BLOOD  URINALYSIS, ROUTINE W REFLEX MICROSCOPIC                                                                                                                         EKG  EKG Interpretation  Date/Time:    Ventricular Rate:    PR Interval:    QRS Duration:   QT Interval:    QTC Calculation:   R Axis:     Text Interpretation:         Radiology CT Renal Stone Study  Result Date: 09/22/2022 CLINICAL DATA:  Abdominal pain/flank pain. EXAM:  CT ABDOMEN AND PELVIS WITHOUT CONTRAST TECHNIQUE: Multidetector CT imaging of the abdomen and pelvis was performed following the standard protocol without IV contrast. RADIATION DOSE REDUCTION: This exam was performed according to the  departmental dose-optimization program which includes automated exposure control, adjustment of the mA and/or kV according to patient size and/or use of iterative reconstruction technique. COMPARISON:  04/24/2017 FINDINGS: Lower chest: No pleural fluid or airspace disease. Mild postinflammatory changes identified with interstitial reticulation and patchy areas of mild ground-glass attenuation identified. Hepatobiliary: No focal liver abnormality. Gallbladder appears normal. No bile duct dilatation. Pancreas: Unremarkable. No pancreatic ductal dilatation or surrounding inflammatory changes. Spleen: Normal in size without focal abnormality. Adrenals/Urinary Tract: Normal adrenal glands. Multiple left kidney parapelvic cysts are noted. These measure up to 3.8 cm. No follow-up imaging recommended. Asymmetric right renal scarring and mild atrophy is identified. There is a stone within the central right renal pelvis with maximum dimension of 6 mm, image 39/3. 2-3 mm stone noted within the inferior pole collecting system of the right kidney. No significant hydronephrosis identified. No hydroureter or ureteral calculi identified bilaterally. Bladder is unremarkable. Stomach/Bowel: Stomach is normal. The appendix is visualized and appears normal. No bowel wall thickening, inflammation, or distension. Sigmoid diverticulosis without signs of acute diverticulitis. Vascular/Lymphatic: Aortic atherosclerosis. No aneurysm. No signs of abdominopelvic adenopathy. Reproductive: Prostate is unremarkable. Other: No free fluid or fluid collections. Status post periumbilical hernia repair. Musculoskeletal: No acute or significant osseous findings. Degenerative disc disease identified within the lower lumbar spine. IMPRESSION: 1. No acute findings within the abdomen or pelvis. 2. 6 mm stone within the central right renal pelvis without significant hydronephrosis. 3. Sigmoid diverticulosis without signs of acute diverticulitis. 4.  Aortic  Atherosclerosis (ICD10-I70.0). Electronically Signed   By: Kerby Moors M.D.   On: 09/22/2022 05:30    Medications Ordered in ED Medications  fentaNYL (SUBLIMAZE) injection 50 mcg (50 mcg Intravenous Given 09/22/22 0413)  sodium chloride 0.9 % bolus 1,000 mL (0 mLs Intravenous Stopped 09/22/22 0549)  alum & mag hydroxide-simeth (MAALOX/MYLANTA) 200-200-20 MG/5ML suspension 30 mL (30 mLs Oral Given 09/22/22 0548)    And  lidocaine (XYLOCAINE) 2 % viscous mouth solution 15 mL (15 mLs Oral Given 09/22/22 0549)                                                                                                                                     Procedures Procedures  (including critical care time)  Medical Decision Making / ED Course   Medical Decision Making Amount and/or Complexity of Data Reviewed Labs: ordered. Decision-making details documented in ED Course. Radiology: ordered and independent interpretation performed. Decision-making details documented in ED Course.  Risk Prescription drug management. Decision regarding hospitalization.    This patient presents to the ED for concern of abdominal pain, this involves an extensive number of treatment options, and is a complaint that carries with it a high risk of  complications and morbidity. The differential diagnosis includes but not limited to pancreatitis, biliary disease, gastritis, gastroenteritis, bowel obstruction, intra-abdominal inflammatory/infectious process, renal colic.  Patient was provided with a small dose of IV fentanyl and fluid bolus.  CBC with mild leukocytosis.  No anemia. Metabolic panel without significant electrolyte derangements.  Hyperglycemia without evidence of DKA.   Patient with renal insufficiency with a creatinine of 2.96.  He states that 2 weeks ago, his creatinine was 2.6.  He already has a nephrologist with an upcoming appointment in 1 week. No evidence of biliary obstruction or pancreatitis. CT of the  abdomen without any evidence of acute intra-abdominal inflammatory/infectious process or bowel obstruction.  He does have a nonobstructive 6 mm right kidney stone.  Patient also has diverticulosis without diverticulitis. UA without evidence of infection, hematuria.   After fentanyl, patient's pain did improve but he still in some discomfort. GI cocktail was given providing significant relief. Tolerating p.o.     Final Clinical Impression(s) / ED Diagnoses Final diagnoses:  Epigastric pain   The patient appears reasonably screened and/or stabilized for discharge and I doubt any other medical condition or other [facility] requiring further screening, evaluation, or treatment in the ED at this time. I have discussed the findings, Dx and Tx plan with the patient/family who expressed understanding and agree(s) with the plan. Discharge instructions discussed at length. The patient/family was given strict return precautions who verbalized understanding of the instructions. No further questions at time of discharge.  Disposition: Discharge  Condition: Good  ED Discharge Orders          Ordered    ondansetron (ZOFRAN-ODT) 4 MG disintegrating tablet  Every 8 hours PRN        09/22/22 0645    lidocaine (XYLOCAINE) 2 % solution  Every 6 hours PRN        09/22/22 0645    alum & mag hydroxide-simeth (MAALOX MAX) [id number] MG/5ML suspension  Every 6 hours PRN        09/22/22 0645             Follow Up: Deland Pretty, MD [place]  [phone]  Call  to schedule an appointment for close follow up           This chart was dictated using voice recognition software.  Despite best efforts to proofread,  errors can occur which can change the documentation meaning.    Fatima Blank, MD 09/22/22 [phone]

## 2022-09-22 NOTE — ED Triage Notes (Signed)
Pt endorses abdominal pain that started last night about 7pm.  Pt was unable to sleep due to the pain.  Pt took "all kinds of heartburn medications " but found no relief.  "It feels like I'm really bloated and real sharp pain... 9-10 times worse than heartburn."

## 2022-10-27 ENCOUNTER — Other Ambulatory Visit: Payer: Self-pay | Admitting: General Surgery

## 2022-10-27 DIAGNOSIS — R1011 Right upper quadrant pain: Secondary | ICD-10-CM

## 2022-10-29 ENCOUNTER — Ambulatory Visit
Admission: RE | Admit: 2022-10-29 | Discharge: 2022-10-29 | Disposition: A | Discharge status: Home / Self Care | Payer: Medicare Other | Source: Ambulatory Visit | Attending: General Surgery | Admitting: General Surgery

## 2022-10-29 DIAGNOSIS — R1011 Right upper quadrant pain: Secondary | ICD-10-CM | POA: Insufficient documentation

## 2022-11-07 ENCOUNTER — Observation Stay (HOSPITAL_COMMUNITY)
Admission: EM | Admit: 2022-11-07 | Discharge: 2022-11-08 | Disposition: A | Discharge status: Home / Self Care | Payer: Medicare Other | Attending: Surgery | Admitting: Surgery

## 2022-11-07 ENCOUNTER — Emergency Department (HOSPITAL_COMMUNITY): Payer: Medicare Other | Admitting: Anesthesiology

## 2022-11-07 ENCOUNTER — Other Ambulatory Visit: Payer: Self-pay

## 2022-11-07 ENCOUNTER — Encounter (HOSPITAL_COMMUNITY)
Admission: EM | Disposition: A | Discharge status: Home / Self Care | Payer: Self-pay | Source: Home / Self Care | Attending: Emergency Medicine

## 2022-11-07 ENCOUNTER — Emergency Department (HOSPITAL_COMMUNITY): Payer: Medicare Other

## 2022-11-07 ENCOUNTER — Encounter (HOSPITAL_COMMUNITY): Payer: Self-pay | Admitting: Emergency Medicine

## 2022-11-07 DIAGNOSIS — R17 Unspecified jaundice: Secondary | ICD-10-CM | POA: Insufficient documentation

## 2022-11-07 DIAGNOSIS — Z79899 Other long term (current) drug therapy: Secondary | ICD-10-CM | POA: Insufficient documentation

## 2022-11-07 DIAGNOSIS — Z85828 Personal history of other malignant neoplasm of skin: Secondary | ICD-10-CM | POA: Diagnosis not present

## 2022-11-07 DIAGNOSIS — I10 Essential (primary) hypertension: Secondary | ICD-10-CM | POA: Diagnosis not present

## 2022-11-07 DIAGNOSIS — K8010 Calculus of gallbladder with chronic cholecystitis without obstruction: Secondary | ICD-10-CM | POA: Diagnosis not present

## 2022-11-07 DIAGNOSIS — K8012 Calculus of gallbladder with acute and chronic cholecystitis without obstruction: Secondary | ICD-10-CM | POA: Diagnosis not present

## 2022-11-07 DIAGNOSIS — Z91010 Allergy to peanuts: Secondary | ICD-10-CM | POA: Diagnosis not present

## 2022-11-07 DIAGNOSIS — R1011 Right upper quadrant pain: Secondary | ICD-10-CM | POA: Diagnosis present

## 2022-11-07 HISTORY — PX: CHOLECYSTECTOMY: SHX55

## 2022-11-07 LAB — COMPREHENSIVE METABOLIC PANEL
ALT: 28 U/L (ref 0–44)
AST: 23 U/L (ref 15–41)
Albumin: 3.7 g/dL (ref 3.5–5.0)
Alkaline Phosphatase: 139 U/L — ABNORMAL HIGH (ref 38–126)
Anion gap: 14 (ref 5–15)
BUN: 41 mg/dL — ABNORMAL HIGH (ref 8–23)
CO2: 22 mmol/L (ref 22–32)
Calcium: 9 mg/dL (ref 8.9–10.3)
Chloride: 101 mmol/L (ref 98–111)
Creatinine, Ser: 2.51 mg/dL — ABNORMAL HIGH (ref 0.61–1.24)
GFR, Estimated: 27 mL/min — ABNORMAL LOW (ref >60)
Glucose, Bld: 122 mg/dL — ABNORMAL HIGH (ref 70–99)
Potassium: 4.9 mmol/L (ref 3.5–5.1)
Sodium: 137 mmol/L (ref 135–145)
Total Bilirubin: 1.8 mg/dL — ABNORMAL HIGH (ref 0.3–1.2)
Total Protein: 6.6 g/dL (ref 6.5–8.1)

## 2022-11-07 LAB — CBC WITH DIFFERENTIAL/PLATELET
Abs Immature Granulocytes: 0.01 10*3/uL (ref 0.00–0.07)
Basophils Absolute: 0 10*3/uL (ref 0.0–0.1)
Basophils Relative: 0 %
Eosinophils Absolute: 0.1 10*3/uL (ref 0.0–0.5)
Eosinophils Relative: 1 %
HCT: 44.3 % (ref 39.0–52.0)
Hemoglobin: 15.4 g/dL (ref 13.0–17.0)
Immature Granulocytes: 0 %
Lymphocytes Relative: 12 %
Lymphs Abs: 1.1 10*3/uL (ref 0.7–4.0)
MCH: 32.2 pg (ref 26.0–34.0)
MCHC: 34.8 g/dL (ref 30.0–36.0)
MCV: 92.7 fL (ref 80.0–100.0)
Monocytes Absolute: 0.7 10*3/uL (ref 0.1–1.0)
Monocytes Relative: 7 %
Neutro Abs: 7.4 10*3/uL (ref 1.7–7.7)
Neutrophils Relative %: 80 %
Platelets: 177 10*3/uL (ref 150–400)
RBC: 4.78 MIL/uL (ref 4.22–5.81)
RDW: 13.2 % (ref 11.5–15.5)
WBC: 9.3 10*3/uL (ref 4.0–10.5)
nRBC: 0 % (ref 0.0–0.2)

## 2022-11-07 LAB — LIPASE, BLOOD: Lipase: 27 U/L (ref 11–51)

## 2022-11-07 LAB — URINALYSIS, ROUTINE W REFLEX MICROSCOPIC
Bacteria, UA: NONE SEEN
Bilirubin Urine: NEGATIVE
Glucose, UA: NEGATIVE mg/dL
Ketones, ur: NEGATIVE mg/dL
Leukocytes,Ua: NEGATIVE
Nitrite: NEGATIVE
Protein, ur: NEGATIVE mg/dL
Specific Gravity, Urine: 1.016 (ref 1.005–1.030)
pH: 5 (ref 5.0–8.0)

## 2022-11-07 SURGERY — LAPAROSCOPIC CHOLECYSTECTOMY WITH INTRAOPERATIVE CHOLANGIOGRAM
Anesthesia: General

## 2022-11-07 MED ORDER — FENTANYL CITRATE (PF) 250 MCG/5ML IJ SOLN
INTRAMUSCULAR | Status: AC
  Filled 2022-11-07: qty 5

## 2022-11-07 MED ORDER — BUPIVACAINE LIPOSOME 1.3 % IJ SUSP
INTRAMUSCULAR | Status: AC
  Filled 2022-11-07: qty 20

## 2022-11-07 MED ORDER — ONDANSETRON 4 MG PO TBDP: 4.0000 mg | ORAL_TABLET | Freq: Four times a day (QID) | ORAL | Status: DC | PRN

## 2022-11-07 MED ORDER — ONDANSETRON HCL 4 MG/2ML IJ SOLN: 4.0000 mg | Freq: Once | INTRAMUSCULAR | Status: DC | PRN

## 2022-11-07 MED ORDER — HYDROMORPHONE HCL 1 MG/ML IJ SOLN: 0.5000 mg | INTRAMUSCULAR | Status: DC | PRN

## 2022-11-07 MED ORDER — CEFAZOLIN SODIUM-DEXTROSE 2-4 GM/100ML-% IV SOLN
INTRAVENOUS | Status: AC
  Filled 2022-11-07: qty 100

## 2022-11-07 MED ORDER — ONDANSETRON HCL 4 MG/2ML IJ SOLN
INTRAMUSCULAR | Status: DC | PRN
  Administered 2022-11-07: 4 mg via INTRAVENOUS

## 2022-11-07 MED ORDER — METHOCARBAMOL 500 MG PO TABS: 1000.0000 mg | ORAL_TABLET | Freq: Three times a day (TID) | ORAL | Status: DC

## 2022-11-07 MED ORDER — DOCUSATE SODIUM 100 MG PO CAPS
100.0000 mg | ORAL_CAPSULE | Freq: Two times a day (BID) | ORAL | Status: DC
  Filled 2022-11-07: qty 1

## 2022-11-07 MED ORDER — ALBUMIN HUMAN 5 % IV SOLN: INTRAVENOUS | Status: DC | PRN

## 2022-11-07 MED ORDER — PHENYLEPHRINE 80 MCG/ML (10ML) SYRINGE FOR IV PUSH (FOR BLOOD PRESSURE SUPPORT)
PREFILLED_SYRINGE | INTRAVENOUS | Status: DC | PRN
  Administered 2022-11-07 (×2): 160 ug via INTRAVENOUS

## 2022-11-07 MED ORDER — SUGAMMADEX SODIUM 200 MG/2ML IV SOLN
INTRAVENOUS | Status: DC | PRN
  Administered 2022-11-07: 200 mg via INTRAVENOUS

## 2022-11-07 MED ORDER — BUPIVACAINE HCL (PF) 0.25 % IJ SOLN
INTRAMUSCULAR | Status: AC
  Filled 2022-11-07: qty 30

## 2022-11-07 MED ORDER — CHLORHEXIDINE GLUCONATE 0.12 % MT SOLN
15.0000 mL | Freq: Once | OROMUCOSAL | Status: AC
  Administered 2022-11-07: 15 mL via OROMUCOSAL

## 2022-11-07 MED ORDER — FENTANYL CITRATE (PF) 250 MCG/5ML IJ SOLN
INTRAMUSCULAR | Status: DC | PRN
  Administered 2022-11-07: 150 ug via INTRAVENOUS
  Administered 2022-11-07 (×2): 50 ug via INTRAVENOUS

## 2022-11-07 MED ORDER — INDOCYANINE GREEN 25 MG IV SOLR
INTRAVENOUS | Status: DC | PRN
  Administered 2022-11-07 (×4): 5 mg via INTRAVENOUS

## 2022-11-07 MED ORDER — LACTATED RINGERS IV SOLN: INTRAVENOUS | Status: DC

## 2022-11-07 MED ORDER — OXYCODONE HCL 5 MG PO TABS: 5.0000 mg | ORAL_TABLET | ORAL | Status: DC | PRN

## 2022-11-07 MED ORDER — ACETAMINOPHEN 500 MG PO TABS
1000.0000 mg | ORAL_TABLET | Freq: Four times a day (QID) | ORAL | Status: DC
  Filled 2022-11-07: qty 2

## 2022-11-07 MED ORDER — HYDROMORPHONE HCL 1 MG/ML IJ SOLN
INTRAMUSCULAR | Status: DC | PRN
  Administered 2022-11-07 (×2): .25 mg via INTRAVENOUS

## 2022-11-07 MED ORDER — CEFAZOLIN SODIUM-DEXTROSE 2-4 GM/100ML-% IV SOLN
2.0000 g | INTRAVENOUS | Status: AC
  Administered 2022-11-07: 2 g via INTRAVENOUS

## 2022-11-07 MED ORDER — ONDANSETRON HCL 4 MG/2ML IJ SOLN
4.0000 mg | Freq: Once | INTRAMUSCULAR | Status: AC
Start: 2022-11-07 — End: 2022-11-07
  Administered 2022-11-07: 4 mg via INTRAVENOUS
  Filled 2022-11-07: qty 2

## 2022-11-07 MED ORDER — PROPOFOL 10 MG/ML IV BOLUS
INTRAVENOUS | Status: AC
  Filled 2022-11-07: qty 20

## 2022-11-07 MED ORDER — PROPOFOL 10 MG/ML IV BOLUS
INTRAVENOUS | Status: DC | PRN
  Administered 2022-11-07: 200 mg via INTRAVENOUS

## 2022-11-07 MED ORDER — ROCURONIUM BROMIDE 10 MG/ML (PF) SYRINGE
PREFILLED_SYRINGE | INTRAVENOUS | Status: DC | PRN
  Administered 2022-11-07 (×2): 10 mg via INTRAVENOUS
  Administered 2022-11-07: 50 mg via INTRAVENOUS

## 2022-11-07 MED ORDER — PHENYLEPHRINE HCL-NACL 20-0.9 MG/250ML-% IV SOLN
INTRAVENOUS | Status: DC | PRN
  Administered 2022-11-07: 25 ug/min via INTRAVENOUS

## 2022-11-07 MED ORDER — FLUORESCEIN SODIUM 10 % IV SOLN
INTRAVENOUS | Status: AC
  Filled 2022-11-07: qty 5

## 2022-11-07 MED ORDER — LIDOCAINE 2% (20 MG/ML) 5 ML SYRINGE
INTRAMUSCULAR | Status: DC | PRN
  Administered 2022-11-07: 100 mg via INTRAVENOUS

## 2022-11-07 MED ORDER — SODIUM CHLORIDE 0.9 % IR SOLN
Status: DC | PRN
  Administered 2022-11-07: 1000 mL

## 2022-11-07 MED ORDER — HYDROMORPHONE HCL 1 MG/ML IJ SOLN
0.5000 mg | Freq: Once | INTRAMUSCULAR | Status: AC
  Administered 2022-11-07: 0.5 mg via INTRAVENOUS
  Filled 2022-11-07: qty 1

## 2022-11-07 MED ORDER — DEXAMETHASONE SODIUM PHOSPHATE 10 MG/ML IJ SOLN
INTRAMUSCULAR | Status: DC | PRN
  Administered 2022-11-07: 8 mg via INTRAVENOUS

## 2022-11-07 MED ORDER — HEPARIN SODIUM (PORCINE) 5000 UNIT/ML IJ SOLN
5000.0000 [IU] | Freq: Three times a day (TID) | INTRAMUSCULAR | Status: DC
  Filled 2022-11-07: qty 1

## 2022-11-07 MED ORDER — HEPARIN SODIUM (PORCINE) 5000 UNIT/ML IJ SOLN
5000.0000 [IU] | Freq: Once | INTRAMUSCULAR | Status: DC
  Filled 2022-11-07: qty 1

## 2022-11-07 MED ORDER — BUPIVACAINE LIPOSOME 1.3 % IJ SUSP: INTRAMUSCULAR | Status: DC | PRN

## 2022-11-07 MED ORDER — CHLORHEXIDINE GLUCONATE CLOTH 2 % EX PADS: 6.0000 | MEDICATED_PAD | Freq: Once | CUTANEOUS | Status: DC

## 2022-11-07 MED ORDER — HYDROMORPHONE HCL 1 MG/ML IJ SOLN: 1.0000 mg | INTRAMUSCULAR | Status: DC | PRN

## 2022-11-07 MED ORDER — ORAL CARE MOUTH RINSE: 15.0000 mL | Freq: Once | OROMUCOSAL | Status: AC

## 2022-11-07 MED ORDER — SODIUM CHLORIDE 0.9 % IV SOLN: INTRAVENOUS | Status: DC

## 2022-11-07 MED ORDER — BUPIVACAINE LIPOSOME 1.3 % IJ SUSP: 20.0000 mL | Freq: Once | INTRAMUSCULAR | Status: DC

## 2022-11-07 MED ORDER — ONDANSETRON HCL 4 MG/2ML IJ SOLN: 4.0000 mg | Freq: Four times a day (QID) | INTRAMUSCULAR | Status: DC | PRN

## 2022-11-07 MED ORDER — LABETALOL HCL 5 MG/ML IV SOLN
INTRAVENOUS | Status: DC | PRN
  Administered 2022-11-07 (×2): 2.5 mg via INTRAVENOUS

## 2022-11-07 MED ORDER — CHLORHEXIDINE GLUCONATE CLOTH 2 % EX PADS
6.0000 | MEDICATED_PAD | Freq: Once | CUTANEOUS | Status: AC
  Administered 2022-11-07: 6 via TOPICAL

## 2022-11-07 MED ORDER — HYDROMORPHONE HCL 1 MG/ML IJ SOLN: 0.2500 mg | INTRAMUSCULAR | Status: DC | PRN

## 2022-11-07 MED ORDER — HYDROMORPHONE HCL 1 MG/ML IJ SOLN
INTRAMUSCULAR | Status: AC
  Filled 2022-11-07: qty 0.5

## 2022-11-07 SURGICAL SUPPLY — 47 items
APPLIER CLIP 5 13 M/L LIGAMAX5 (MISCELLANEOUS) ×2
BLADE CLIPPER SURG (BLADE) IMPLANT
CANISTER SUCT 3000ML PPV (MISCELLANEOUS) ×2 IMPLANT
CHLORAPREP W/TINT 26 (MISCELLANEOUS) ×2 IMPLANT
CLIP APPLIE 5 13 M/L LIGAMAX5 (MISCELLANEOUS) ×2 IMPLANT
COVER SURGICAL LIGHT HANDLE (MISCELLANEOUS) ×2 IMPLANT
DERMABOND ADVANCED .7 DNX12 (GAUZE/BANDAGES/DRESSINGS) ×2 IMPLANT
DERMABOND ADVANCED .7 DNX6 (GAUZE/BANDAGES/DRESSINGS) ×1 IMPLANT
DISSECTOR BLUNT TIP ENDO 5MM (MISCELLANEOUS) IMPLANT
DRAIN CHANNEL 19F RND (DRAIN) ×1 IMPLANT
ELECT CAUTERY BLADE 6.4 (BLADE) ×2 IMPLANT
ELECT REM PT RETURN 9FT ADLT (ELECTROSURGICAL) ×2
ELECTRODE REM PT RTRN 9FT ADLT (ELECTROSURGICAL) ×2 IMPLANT
ENDOLOOP SUT PDS II  0 18 (SUTURE) ×8
ENDOLOOP SUT PDS II 0 18 (SUTURE) ×4 IMPLANT
EVACUATOR SILICONE 100CC (DRAIN) ×1 IMPLANT
GLOVE BIO SURGEON STRL SZ 6.5 (GLOVE) ×2 IMPLANT
GLOVE BIOGEL PI IND STRL 6 (GLOVE) ×2 IMPLANT
GOWN STRL REUS W/ TWL LRG LVL3 (GOWN DISPOSABLE) ×6 IMPLANT
GOWN STRL REUS W/TWL LRG LVL3 (GOWN DISPOSABLE) ×6
GOWN STRL SURGICAL XL XLNG (GOWN DISPOSABLE) ×1 IMPLANT
HEMOSTAT NU-KNIT SURGICAL 3X4 (HEMOSTASIS) ×1 IMPLANT
HEMOSTAT SNOW SURGICEL 2X4 (HEMOSTASIS) ×1 IMPLANT
HEMOSTAT SURGICEL .5X2 ABSORB (HEMOSTASIS) ×1 IMPLANT
IRRIG SUCT STRYKERFLOW 2 WTIP (MISCELLANEOUS)
IRRIGATION SUCT STRKRFLW 2 WTP (MISCELLANEOUS) IMPLANT
KIT BASIN OR (CUSTOM PROCEDURE TRAY) ×2 IMPLANT
KIT TURNOVER KIT B (KITS) ×2 IMPLANT
NS IRRIG 1000ML POUR BTL (IV SOLUTION) ×2 IMPLANT
PAD ARMBOARD 7.5X6 YLW CONV (MISCELLANEOUS) ×2 IMPLANT
PENCIL BUTTON HOLSTER BLD 10FT (ELECTRODE) ×2 IMPLANT
POUCH RETRIEVAL ECOSAC 10 (ENDOMECHANICALS) ×2 IMPLANT
POUCH RETRIEVAL ECOSAC 10MM (ENDOMECHANICALS) ×2
SCISSORS LAP 5X35 DISP (ENDOMECHANICALS) ×2 IMPLANT
SET CHOLANGIOGRAPH MIX (MISCELLANEOUS) ×1 IMPLANT
SET TUBE SMOKE EVAC HIGH FLOW (TUBING) ×2 IMPLANT
SLEEVE Z-THREAD 5X100MM (TROCAR) ×4 IMPLANT
SUT ETHILON 2 0 FS 18 (SUTURE) ×1 IMPLANT
SUT MNCRL AB 4-0 PS2 18 (SUTURE) ×2 IMPLANT
SUT VIC AB 0 UR5 27 (SUTURE) IMPLANT
SUT VICRYL 0 AB UR-6 (SUTURE) IMPLANT
TOWEL GREEN STERILE FF (TOWEL DISPOSABLE) ×2 IMPLANT
TRAY LAPAROSCOPIC MC (CUSTOM PROCEDURE TRAY) ×2 IMPLANT
TROCAR BALLN 12MMX100 BLUNT (TROCAR) ×2 IMPLANT
TROCAR Z-THREAD OPTICAL 5X100M (TROCAR) ×2 IMPLANT
WARMER LAPAROSCOPE (MISCELLANEOUS) ×2 IMPLANT
WATER STERILE IRR 1000ML POUR (IV SOLUTION) ×2 IMPLANT

## 2022-11-07 NOTE — Anesthesia Procedure Notes (Signed)
Procedure Name: Intubation Date/Time: 11/07/2022 3:20 PM  Performed by: Heide Scales, CRNAPre-anesthesia Checklist: Patient identified, Emergency Drugs available, Suction available and Patient being monitored Patient Re-evaluated:Patient Re-evaluated prior to induction Oxygen Delivery Method: Circle system utilized Preoxygenation: Pre-oxygenation with 100% oxygen Induction Type: IV induction and Cricoid Pressure applied Ventilation: Mask ventilation without difficulty and Oral airway inserted - appropriate to patient size Laryngoscope Size: Mac and 4 Grade View: Grade II Tube type: Oral Tube size: 7.5 mm Number of attempts: 1 Airway Equipment and Method: Stylet and Oral airway Placement Confirmation: ETT inserted through vocal cords under direct vision, positive ETCO2 and breath sounds checked- equal and bilateral Secured at: 22 cm Tube secured with: Tape Dental Injury: Teeth and Oropharynx as per pre-operative assessment

## 2022-11-07 NOTE — Anesthesia Postprocedure Evaluation (Signed)
Anesthesia Post Note  Patient: Donald Vaughn  Procedure(s) Performed: LAPAROSCOPIC CHOLECYSTECTOMY WITH INTRAOPERATIVE CHOLANGIOGRAM     Patient location during evaluation: PACU Anesthesia Type: General Level of consciousness: awake and alert Pain management: pain level controlled Vital Signs Assessment: post-procedure vital signs reviewed and stable Respiratory status: spontaneous breathing, nonlabored ventilation and respiratory function stable Cardiovascular status: blood pressure returned to baseline and stable Postop Assessment: no apparent nausea or vomiting Anesthetic complications: no  No notable events documented.  Last Vitals:  Vitals:   11/07/22 1945 11/07/22 2028  BP: 123/75 (!) 114/91  Pulse: 77 77  Resp: 14 16  Temp:  36.6 C  SpO2: 93% 97%    Last Pain:  Vitals:   11/07/22 2028  TempSrc: Oral  PainSc:                  Joie Reamer,W. EDMOND

## 2022-11-07 NOTE — ED Notes (Addendum)
CMP and Lipase redrawn and sent to main lab

## 2022-11-07 NOTE — Op Note (Signed)
   Operative Note  Date: 11/07/2022  Procedure: laparoscopic cholecystectomy  Pre-op diagnosis:  hyperbilirubinemia, symptomatic cholelithiasis Post-op diagnosis: hyperbilirubinemia, Chronic calculous cholecystitis  Indication and clinical history: The patient is a 72 y.o. year old male with hyperbilirubinemia and symptomatic cholelithiasis  Surgeon: Jesusita Oka, MD Assistant: Rosendo Gros, MD  Anesthesiologist: Jillyn Hidden, MD Anesthesia: General  Findings:  Specimen: gallbladder EBL: 200cc Drains/Implants: 70F JP RUQ terminating in the gallbladder fossa  Disposition: PACU - hemodynamically stable.  Description of procedure: The patient was positioned supine on the operating room table. Time-out was performed verifying correct patient, procedure, signature of informed consent, and administration of pre-operative antibiotics. General anesthetic induction and intubation were uneventful. The abdomen was prepped and draped in the usual sterile fashion. An infra-umbilical incision was made using an open technique using zero vicryl stay sutures on either side of the fascia and a 41mm Hassan port inserted. After establishing pneumoperitoneum, which the patient tolerated well, the abdominal cavity was inspected and no injury of any intra-abdominal structures was identified. Additional ports were placed under direct visualization and using local anesthetic: two 70mm ports in the right subcostal region and a 32mm port in the epigastric region. The patient was re-positioned to reverse Trendelenburg and right side up. Adhesiolysis was performed to expose the gallbladder, which was then retracted cephalad. The infundibulum was identified and retracted toward the right lower quadrant. The peritoneum was incised over the infundibulum and the triangle of Calot dissected. A ductal structure was entered just distal to the infundibulum and gallstones were visualized. A cholangiogram catheter was threaded through  this ductotomy and cholangiogram attempted. Cholangiogram was unsuccessful due to reflux of contrast into the gallbladder fossa on fluoroscopy. Continued dissection in the trangle of Calot was performed and the cystic artery was identified, doubly clipped, and divided. After this, the ductal structure that was previously entered was confirmed to be a single structure entering the gallbladder, and was divided. Endoloop was used for closure of the duct. The gallbladder was dissected off the liver bed using electrocautery and hemostasis of the liver bed was confirmed using a combination of electrocautery and topical hemostatic agent (surgicel). All spilled gallstones were retrieved. The gallbladder fossa was irrigated and fluid returned clear. The gallbladder was placed in an endoscopic specimen retrieval bag, removed via the umbilical port site, and sent to pathology as a permanent specimen. The gallbladder fossa was inspected confirming hemostasis, the absence of bile leakage from the cystic duct stump, and correct placement of clips on the cystic artery and cystic duct stumps. The abdomen was desufflated and the fascia of the umbilical port site was closed using the previously placed stay sutures. Additional local anesthetic was administered at the umbilical port site.  The skin of all incisions was closed with 4-0 monocryl. Sterile dressings were applied. All sponge and instrument counts were correct at the conclusion of the procedure. The patient was awakened from anesthesia, extubated uneventfully, and transported to the PACU - hemodynamically stable. There were no complications.    Jesusita Oka, MD General and [facility]

## 2022-11-07 NOTE — Transfer of Care (Signed)
Immediate Anesthesia Transfer of Care Note  Patient: Donald Vaughn  Procedure(s) Performed: LAPAROSCOPIC CHOLECYSTECTOMY WITH INTRAOPERATIVE CHOLANGIOGRAM  Patient Location: PACU  Anesthesia Type:General  Level of Consciousness: awake and patient cooperative  Airway & Oxygen Therapy: Patient Spontanous Breathing and Patient connected to face mask oxygen  Post-op Assessment: Report given to RN, Post -op Vital signs reviewed and stable, and Patient moving all extremities  Post vital signs: Reviewed and stable  Last Vitals:  Vitals Value Taken Time  BP 144/86 11/07/22 1815  Temp    Pulse 76 11/07/22 1815  Resp 16 11/07/22 1815  SpO2 98 % 11/07/22 1815  Vitals shown include unvalidated device data.  Last Pain:  Vitals:   11/07/22 1456  TempSrc:   PainSc: 1          Complications: No notable events documented.

## 2022-11-07 NOTE — Anesthesia Preprocedure Evaluation (Signed)
Anesthesia Evaluation  Patient identified by MRN, date of birth, ID band Patient awake    Reviewed: Allergy & Precautions, NPO status , Patient's Chart, lab work & pertinent test results  Airway Mallampati: II       Dental no notable dental hx.    Pulmonary neg pulmonary ROS   Pulmonary exam normal        Cardiovascular hypertension, Pt. on medications Normal cardiovascular exam     Neuro/Psych  negative psych ROS   GI/Hepatic ,GERD  Medicated and Controlled,,  Endo/Other  negative endocrine ROS    Renal/GU Renal InsufficiencyRenal disease  negative genitourinary   Musculoskeletal   Abdominal Normal abdominal exam  (+)   Peds  Hematology negative hematology ROS (+)   Anesthesia Other Findings   Reproductive/Obstetrics                             Anesthesia Physical Anesthesia Plan  ASA: 2  Anesthesia Plan: General   Post-op Pain Management: Dilaudid IV   Induction: Intravenous  PONV Risk Score and Plan: 4 or greater and Ondansetron, Dexamethasone and Treatment may vary due to age or medical condition  Airway Management Planned: Oral ETT  Additional Equipment: None  Intra-op Plan:   Post-operative Plan: Extubation in OR  Informed Consent: I have reviewed the patients History and Physical, chart, labs and discussed the procedure including the risks, benefits and alternatives for the proposed anesthesia with the patient or authorized representative who has indicated his/her understanding and acceptance.     Dental advisory given  Plan Discussed with: CRNA  Anesthesia Plan Comments:        Anesthesia Quick Evaluation

## 2022-11-07 NOTE — ED Triage Notes (Signed)
Pt comes in due to abdominal pain for the past two days that causes nausea.  Pt states he does have history of gallbladder stones.

## 2022-11-07 NOTE — H&P (Signed)
Reason for Consult/Chief Complaint: symptomatic cholelithiasis Consultant: Elie Confer, PA  Donald Vaughn is an 72 y.o. male.   HPI: Donald Vaughn with h/o symptomatic cholelithiasis, was scheduled for lap chole 4/9 with LK. Presents today with worsened RUQ pain, n/v. Tbili 1.8. H/o UHR with mesh ~10y ago by MW.   Past Medical History:  Diagnosis Date   Alcohol abuse    hx   Arthritis    BCC (basal cell carcinoma of skin)    Benign prostatic hypertrophy    hx   BPH (benign prostatic hyperplasia)    BPH (benign prostatic hyperplasia)    Chest pain, atypical    Diverticulitis    Generalized headaches    GERD (gastroesophageal reflux disease)    with history of esophagitis   Hearing loss    Hypercholesterolemia    Hypertension    Leukopenia    mild   Obese    Pre-diabetes    Rectal bleeding    Skin cancer    Tinnitus    chronic    Past Surgical History:  Procedure Laterality Date   BACK SURGERY     L4/5 Dr Lyman Speller   COLONOSCOPY  2/03   norma. (Dr. Lajoyce Corners)   EGD- normal  2/03   Dr. Lajoyce Corners.   HERNIA REPAIR     umbilical with PVP   left foot surgery     R shoulder surgery  4/03   Dr. Noemi Chapel    Family History  Problem Relation Age of Onset   Lymphoma Mother    Hypertension Mother    Hyperlipidemia Mother    Diabetes Mother    Alzheimer's disease Father     Social History:  reports that he has never smoked. He has never used smokeless tobacco. He reports that he does not drink alcohol and does not use drugs.  Allergies:  Allergies  Allergen Reactions   Cats Claw [Uncaria Tomentosa (Cats Claw)]     allergy   Codeine     Stomach upset   Oxycodone-Aspirin     REACTION: NAUSEA AND VOMITING   Peanut Butter Flavor     Swelling/welts   Septra [Sulfamethoxazole-Trimethoprim]     rash   Sulfonamide Derivatives     REACTION: SWELLING    Medications: I have reviewed the patient's current medications.  Results for orders placed or performed during the hospital encounter of  11/07/22 (from the past 48 hour(s))  CBC with Differential     Status: None   Collection Time: 11/07/22  9:18 AM  Result Value Ref Range   WBC 9.3 4.0 - 10.5 K/uL   RBC 4.78 4.22 - 5.81 MIL/uL   Hemoglobin 15.4 13.0 - 17.0 g/dL   HCT 44.3 39.0 - 52.0 %   MCV 92.7 80.0 - 100.0 fL   MCH 32.2 26.0 - 34.0 pg   MCHC 34.8 30.0 - 36.0 g/dL   RDW 13.2 11.5 - 15.5 %   Platelets 177 150 - 400 K/uL   nRBC 0.0 0.0 - 0.2 %   Neutrophils Relative % 80 %   Neutro Abs 7.4 1.7 - 7.7 K/uL   Lymphocytes Relative 12 %   Lymphs Abs 1.1 0.7 - 4.0 K/uL   Monocytes Relative 7 %   Monocytes Absolute 0.7 0.1 - 1.0 K/uL   Eosinophils Relative 1 %   Eosinophils Absolute 0.1 0.0 - 0.5 K/uL   Basophils Relative 0 %   Basophils Absolute 0.0 0.0 - 0.1 K/uL   Immature Granulocytes 0 %  Abs Immature Granulocytes 0.01 0.00 - 0.07 K/uL    Comment: Performed at [facility], [place] [address], [place], [place] [address]  Comprehensive metabolic panel     Status: Abnormal   Collection Time: 11/07/22 10:30 AM  Result Value Ref Range   Sodium 137 135 - 145 mmol/L   Potassium 4.9 3.5 - 5.1 mmol/L    Comment: HEMOLYSIS AT THIS LEVEL MAY AFFECT RESULT   Chloride 101 98 - 111 mmol/L   CO2 22 22 - 32 mmol/L   Glucose, Bld 122 (H) 70 - 99 mg/dL    Comment: Glucose reference range applies only to samples taken after fasting for at least 8 hours.   BUN 41 (H) 8 - 23 mg/dL   Creatinine, Ser 2.51 (H) 0.61 - 1.24 mg/dL   Calcium 9.0 8.9 - 10.3 mg/dL   Total Protein 6.6 6.5 - 8.1 g/dL   Albumin 3.7 3.5 - 5.0 g/dL   AST 23 15 - 41 U/L    Comment: HEMOLYSIS AT THIS LEVEL MAY AFFECT RESULT   ALT 28 0 - 44 U/L    Comment: HEMOLYSIS AT THIS LEVEL MAY AFFECT RESULT   Alkaline Phosphatase 139 (H) 38 - 126 U/L   Total Bilirubin 1.8 (H) 0.3 - 1.2 mg/dL    Comment: HEMOLYSIS AT THIS LEVEL MAY AFFECT RESULT   GFR, Estimated 27 (L) >60 mL/min    Comment: (NOTE) Calculated using the CKD-EPI Creatinine Equation (2021)     Anion gap 14 5 - 15    Comment: Performed at [facility], [place] [address], [place], [place] [address]  Lipase, blood     Status: None   Collection Time: 11/07/22 10:30 AM  Result Value Ref Range   Lipase 27 11 - 51 U/L    Comment: Performed at [facility] [address], [place], [place] [address]  Urinalysis, Routine w reflex microscopic -Urine, Clean Catch     Status: Abnormal   Collection Time: 11/07/22 11:25 AM  Result Value Ref Range   Color, Urine YELLOW YELLOW   APPearance CLEAR CLEAR   Specific Gravity, Urine 1.016 1.005 - 1.030   pH 5.0 5.0 - 8.0   Glucose, UA NEGATIVE NEGATIVE mg/dL   Hgb urine dipstick MODERATE (A) NEGATIVE   Bilirubin Urine NEGATIVE NEGATIVE   Ketones, ur NEGATIVE NEGATIVE mg/dL   Protein, ur NEGATIVE NEGATIVE mg/dL   Nitrite NEGATIVE NEGATIVE   Leukocytes,Ua NEGATIVE NEGATIVE   RBC / HPF 21-50 0 - 5 RBC/hpf   WBC, UA 0-5 0 - 5 WBC/hpf   Bacteria, UA NONE SEEN NONE SEEN   Squamous Epithelial / HPF 0-5 0 - 5 /HPF   Mucus PRESENT    Uric Acid Crys, UA PRESENT     Comment: Performed at [facility], [address] [place], [place] [address]    US Abdomen Limited RUQ (LIVER/GB)  Result Date: 11/07/2022 CLINICAL DATA:  Colicky right upper quadrant abdominal pain EXAM: ULTRASOUND ABDOMEN LIMITED RIGHT UPPER QUADRANT COMPARISON:  None Available. FINDINGS: Gallbladder: Gallstones. No gallbladder wall thickening. No sonographic Murphy sign noted by sonographer. Common bile duct: Diameter: 0.3 cm. Liver: No focal lesion identified. Increased parenchymal echogenicity. Portal vein is patent on color Doppler imaging with normal direction of blood flow towards the liver. Other: None. IMPRESSION: 1. Cholelithiasis without sonographic evidence of acute cholecystitis. 2. Hepatic steatosis. Electronically Signed   By: Delanna Ahmadi M.D.   On: 11/07/2022 11:02  ROS 10 point review of systems is negative except as listed above in HPI.   Physical  Exam Blood pressure 137/72, pulse 68, temperature 97.8 F (36.6 C), temperature source Oral, resp. rate 10, height 5\' 8"  (1.727 m), weight 90.7 kg, SpO2 96 %. Constitutional: well-developed, well-nourished HEENT: pupils equal, round, reactive to light, 50mm b/l, moist conjunctiva, external inspection of ears and nose normal, hearing intact Oropharynx: normal oropharyngeal mucosa, normal dentition Neck: no thyromegaly, trachea midline, no midline cervical tenderness to palpation Chest: breath sounds equal bilaterally, normal respiratory effort, no midline or lateral chest wall tenderness to palpation/deformity Abdomen: soft, RUQ TTP, well healed UHR, no bruising, no hepatosplenomegaly GU: no blood at urethral meatus of penis, no scrotal masses or abnormality  Back: no wounds, no thoracic/lumbar spine tenderness to palpation, no thoracic/lumbar spine stepoffs Rectal: deferred Extremities: 2+ radial and pedal pulses bilaterally, intact motor and sensation bilateral UE and LE, no peripheral edema MSK: normal gait/station, no clubbing/cyanosis of fingers/toes, normal ROM of all four extremities Skin: warm, dry, no rashes Psych: normal memory, normal mood/affect     Assessment/Plan: 37M with hyperbilirubinemia and symptomatic cholelithiasis. Plan for lap chole with IOC. Informed consent was obtained after detailed explanation of risks, including bleeding, infection, biloma, hematoma, injury to common bile duct, and need for conversion to open procedure. All questions answered to the patient's satisfaction. Plan to admit o/n for obs and recheck labs in AM.   Jesusita Oka, MD General and [facility]

## 2022-11-07 NOTE — ED Provider Notes (Signed)
[facility] Provider Note   CSN: [id number] Arrival date & time: 11/07/22  [id number]     History  Chief Complaint  Patient presents with   Abdominal Pain    Donald Vaughn is a 72 y.o. male with medical history of BPH, diverticulitis, GERD, hypertension.  Patient presents to ED for evaluation of abdominal pain.  Patient reports that for the last 30 hours he has had a "gallbladder attack".  Patient reports that he was recently seen by [facility] surgery, Dr. Kieth Brightly, with plans for outpatient cholecystectomy on 4/9.  Patient reports that he has had extreme right upper quadrant pain for the last 30 hours unrelieved at home by p.o. medications.  Patient reports multiple episodes of nausea and vomiting.  Patient denies any diarrhea.  Patient denies any fevers, chest pain or shortness of breath.  Patient reports that he attempted to wait out the pain however states that the pain is not resolving as he expected it to.   Abdominal Pain Associated symptoms: nausea and vomiting   Associated symptoms: no fever        Home Medications Prior to Admission medications   Medication Sig Start Date End Date Taking? Authorizing Provider  alum & mag hydroxide-simeth (MAALOX MAX) 400-400-40 MG/5ML suspension Take 10 mLs by mouth every 6 (six) hours as needed for indigestion. 09/22/22   Fatima Blank, MD  CALCIUM PO Take 1 tablet by mouth daily.    [provider]  famotidine (PEPCID) 20 MG tablet Take 1 tablet (20 mg total) by mouth 2 (two) times daily. 12/25/16   Rolland Porter, MD  Hyaluronic Acid-Vitamin C (HYALURONIC ACID PO) Take 1 tablet by mouth daily.    [provider]  lidocaine (XYLOCAINE) 2 % solution Use as directed 10 mLs in the mouth or throat every 6 (six) hours as needed (stomach pain). 09/22/22   Fatima Blank, MD  losartan-hydrochlorothiazide (HYZAAR) 100-12.5 MG tablet Take 0.5 tablets by mouth daily.     [provider]  Multiple Vitamins-Minerals (PRESERVISION AREDS PO) Take 1 tablet by mouth daily.    [provider]  predniSONE (DELTASONE) 20 MG tablet Take 3 po QD x 3d , then 2 po QD x 3d then 1 po QD x 3d 12/25/16   Rolland Porter, MD      Allergies    Cats claw Angelica Ran tomentosa (cats claw)], Codeine, Oxycodone-aspirin, Peanut butter flavor, Septra [sulfamethoxazole-trimethoprim], and Sulfonamide derivatives    Review of Systems   Review of Systems  Constitutional:  Negative for fever.  Gastrointestinal:  Positive for abdominal pain, nausea and vomiting.  All other systems reviewed and are negative.   Physical Exam Updated Vital Signs BP 137/66   Pulse 79   Temp 97.9 F (36.6 C) (Oral)   Resp 20   Ht 5\' 8"  (1.727 m)   Wt 90.7 kg   SpO2 95%   BMI 30.41 kg/m  Physical Exam Vitals and nursing note reviewed.  Constitutional:      General: He is not in acute distress.    Appearance: He is well-developed.  HENT:     Head: Normocephalic and atraumatic.  Eyes:     Conjunctiva/sclera: Conjunctivae normal.  Cardiovascular:     Rate and Rhythm: Normal rate and regular rhythm.     Heart sounds: No murmur heard. Pulmonary:     Effort: Pulmonary effort is normal. No respiratory distress.     Breath sounds: Normal breath sounds.  Abdominal:     Palpations: Abdomen is soft.     Tenderness: There is abdominal tenderness.     Comments: RUQ  Musculoskeletal:        General: No swelling.     Cervical back: Neck supple.  Skin:    General: Skin is warm and dry.     Capillary Refill: Capillary refill takes less than 2 seconds.  Neurological:     Mental Status: He is alert.  Psychiatric:        Mood and Affect: Mood normal.     ED Results / Procedures / Treatments   Labs (all labs ordered are listed, but only abnormal results are displayed) Labs Reviewed  URINALYSIS, ROUTINE W REFLEX MICROSCOPIC - Abnormal; Notable for the following components:      Result  Value   Hgb urine dipstick MODERATE (*)    All other components within normal limits  COMPREHENSIVE METABOLIC PANEL - Abnormal; Notable for the following components:   Glucose, Bld 122 (*)    BUN 41 (*)    Creatinine, Ser 2.51 (*)    Alkaline Phosphatase 139 (*)    Total Bilirubin 1.8 (*)    GFR, Estimated 27 (*)    All other components within normal limits  CBC WITH DIFFERENTIAL/PLATELET  LIPASE, BLOOD    EKG None  Radiology US Abdomen Limited RUQ (LIVER/GB)  Result Date: 11/07/2022 CLINICAL DATA:  Colicky right upper quadrant abdominal pain EXAM: ULTRASOUND ABDOMEN LIMITED RIGHT UPPER QUADRANT COMPARISON:  None Available. FINDINGS: Gallbladder: Gallstones. No gallbladder wall thickening. No sonographic Murphy sign noted by sonographer. Common bile duct: Diameter: 0.3 cm. Liver: No focal lesion identified. Increased parenchymal echogenicity. Portal vein is patent on color Doppler imaging with normal direction of blood flow towards the liver. Other: None. IMPRESSION: 1. Cholelithiasis without sonographic evidence of acute cholecystitis. 2. Hepatic steatosis. Electronically Signed   By: Delanna Ahmadi M.D.   On: 11/07/2022 11:02    Procedures Procedures   Medications Ordered in ED Medications  Chlorhexidine Gluconate Cloth 2 % PADS 6 each (has no administration in time range)    And  Chlorhexidine Gluconate Cloth 2 % PADS 6 each (has no administration in time range)  heparin injection 5,000 Units (has no administration in time range)  ceFAZolin (ANCEF) IVPB 2g/100 mL premix (has no administration in time range)  bupivacaine liposome (EXPAREL) 1.3 % injection 266 mg (has no administration in time range)  chlorhexidine (PERIDEX) 0.12 % solution 15 mL (has no administration in time range)    Or  Oral care mouth rinse (has no administration in time range)  0.9 %  sodium chloride infusion (has no administration in time range)  ceFAZolin (ANCEF) 2-4 GM/100ML-% IVPB (has no  administration in time range)  ondansetron (ZOFRAN) injection 4 mg (4 mg Intravenous Given 11/07/22 0926)  HYDROmorphone (DILAUDID) injection 0.5 mg (0.5 mg Intravenous Given 11/07/22 0926)  HYDROmorphone (DILAUDID) injection 0.5 mg (0.5 mg Intravenous Given 11/07/22 1307)    ED Course/ Medical Decision Making/ A&P  Medical Decision Making Amount and/or Complexity of Data Reviewed Labs: ordered. Radiology: ordered.  Risk Prescription drug management.   72 year old male presents to ED for evaluation of abdominal pain.  Please see HPI for further details.  On examination patient afebrile and nontachycardic.  Lung sounds clear bilaterally, nonhypoxic.  Abdomen soft and compressible with right upper quadrant tenderness, positive Murphy sign.  Neurological examination at baseline.  CBC without leukocytosis, anemia.  CMP with elevated BUN, elevated creatinine 2.5 which  is patient baseline.  Alk phos elevated at 139, total bilirubin elevated at 1.8.  This is an acute rise compared to patient lab work from 1 month ago.  Urinalysis unremarkable.  Lipase within normal limits.  Ultrasound of abdomen shows cholelithiasis without cholecystitis.  Patient pain well-controlled on 0.5 mg Dilaudid x 2, 4 mg Zofran for nausea.  Discussed patient with PA from general surgery.  They have agreed to conduct surgery today.  Patient amenable to plan.  Patient stable.   Final Clinical Impression(s) / ED Diagnoses Final diagnoses:  RUQ pain    Rx / DC Orders ED Discharge Orders     None         Azucena Cecil, PA-C 11/07/22 1455    Cristie Hem, MD 11/07/22 1505

## 2022-11-08 ENCOUNTER — Encounter (HOSPITAL_COMMUNITY): Payer: Self-pay | Admitting: Surgery

## 2022-11-08 LAB — COMPREHENSIVE METABOLIC PANEL
ALT: 53 U/L — ABNORMAL HIGH (ref 0–44)
AST: 61 U/L — ABNORMAL HIGH (ref 15–41)
Albumin: 3.4 g/dL — ABNORMAL LOW (ref 3.5–5.0)
Alkaline Phosphatase: 113 U/L (ref 38–126)
Anion gap: 12 (ref 5–15)
BUN: 43 mg/dL — ABNORMAL HIGH (ref 8–23)
CO2: 23 mmol/L (ref 22–32)
Calcium: 8.8 mg/dL — ABNORMAL LOW (ref 8.9–10.3)
Chloride: 101 mmol/L (ref 98–111)
Creatinine, Ser: 2.65 mg/dL — ABNORMAL HIGH (ref 0.61–1.24)
GFR, Estimated: 25 mL/min — ABNORMAL LOW (ref >60)
Glucose, Bld: 171 mg/dL — ABNORMAL HIGH (ref 70–99)
Potassium: 4.1 mmol/L (ref 3.5–5.1)
Sodium: 136 mmol/L (ref 135–145)
Total Bilirubin: 1 mg/dL (ref 0.3–1.2)
Total Protein: 6.6 g/dL (ref 6.5–8.1)

## 2022-11-08 LAB — CBC
HCT: 36.5 % — ABNORMAL LOW (ref 39.0–52.0)
Hemoglobin: 12.6 g/dL — ABNORMAL LOW (ref 13.0–17.0)
MCH: 31.8 pg (ref 26.0–34.0)
MCHC: 34.5 g/dL (ref 30.0–36.0)
MCV: 92.2 fL (ref 80.0–100.0)
Platelets: 160 10*3/uL (ref 150–400)
RBC: 3.96 MIL/uL — ABNORMAL LOW (ref 4.22–5.81)
RDW: 13.2 % (ref 11.5–15.5)
WBC: 7.9 10*3/uL (ref 4.0–10.5)
nRBC: 0 % (ref 0.0–0.2)

## 2022-11-08 MED ORDER — DOCUSATE SODIUM 100 MG PO CAPS: 100.0000 mg | ORAL_CAPSULE | Freq: Two times a day (BID) | ORAL | 0 refills | Status: DC

## 2022-11-08 MED ORDER — OXYCODONE HCL 5 MG PO TABS: 5.0000 mg | ORAL_TABLET | ORAL | 0 refills | Status: DC | PRN

## 2022-11-08 MED ORDER — METHOCARBAMOL 750 MG PO TABS: 750.0000 mg | ORAL_TABLET | Freq: Four times a day (QID) | ORAL | 1 refills | Status: DC

## 2022-11-08 MED ORDER — ACETAMINOPHEN 500 MG PO TABS: 1000.0000 mg | ORAL_TABLET | Freq: Four times a day (QID) | ORAL | 3 refills | Status: DC

## 2022-11-08 NOTE — TOC Transition Note (Signed)
Transition of Care [facility]) - CM/SW Discharge Note   Patient Details  Name: Donald Vaughn MRN: [id number] Date of Birth: 01-16-1951  Transition of Care [facility]) CM/SW Contact:  Tom-Johnson, Renea Ee, RN Phone Number: 11/08/2022, 9:50 AM   Clinical Narrative:     Patient is scheduled for discharge today. Readmission Prevention Assessment done. Hospital f/u and discharge instructions on AVS.  Family to transport at discharge. No further TOC needs noted.   Final next level of care: Home/Self Care Barriers to Discharge: Barriers Resolved   Patient Goals and CMS Choice CMS Medicare.gov Compare Post Acute Care list provided to:: Patient Choice offered to / list presented to : NA  Discharge Placement                  Patient to be transferred to facility by: Wife      Discharge Plan and Services Additional resources added to the After Visit Summary for                  DME Arranged: N/A DME Agency: NA       HH Arranged: NA HH Agency: NA        Social Determinants of Health (SDOH) Interventions SDOH Screenings   Tobacco Use: Low Risk  (11/08/2022)     Readmission Risk Interventions    11/08/2022    9:49 AM  Readmission Risk Prevention Plan  Post Dischage Appt Complete  Medication Screening Complete  Transportation Screening Complete

## 2022-11-08 NOTE — Care Management Obs Status (Signed)
[facility] NOTIFICATION   Patient Details  Name: KWAMAINE CHALLIS MRN: [id number] Date of Birth: [birth year]   Medicare Observation Status Notification Given:  Yes    Tom-Johnson, Renea Ee, RN 11/08/2022, 9:18 AM

## 2022-11-08 NOTE — Discharge Summary (Signed)
    Patient ID: Donald Vaughn [id number] 09-Dec-1950 72 y.o.  Admit date: 11/07/2022 Discharge date: 11/08/2022  Admitting Diagnosis: Hyperbilirubinemia, symptomatic cholelithiasis  Discharge Diagnosis Patient Active Problem List   Diagnosis Date Noted   Hyperbilirubinemia 11/07/2022   PALPITATIONS 06/19/2009   CHEST PAIN-UNSPECIFIED 06/19/2009   DYSLIPIDEMIA 04/29/2009   CHEST PAIN, ATYPICAL 04/29/2009   ALCOHOL ABUSE, HX OF 04/29/2009   HYPERCHOLESTEROLEMIA 08/31/2008   BENIGN PROSTATIC HYPERTROPHY, HX OF 08/31/2008    Consultants none  Reason for Admission: Hyperbilirubinemia, symptomatic cholelithiasis  Procedures Laparoscopic cholecystectomy  Hospital Course:  Uncomplicated, bili normalized. Drain left in place at discharge, f/u 7-10d for removal.   Physical Exam: Gen: comfortable, no distress Neuro: non-focal exam HEENT: PERRL Neck: supple CV: RRR Pulm: unlabored breathing Abd: soft, incisions cdi, drain SS GU: clear yellow urine, spont voids Extr: wwp, no edema    Follow-up Information     Jesusita Oka, MD. Schedule an appointment as soon as possible for a visit in 1 week(s).   Specialty: Surgery Why: for drain removal Contact information: [place] [address] [phone]                  Signed: Jesusita Oka, [facility] 11/08/2022, 8:18 AM

## 2022-11-08 NOTE — Progress Notes (Signed)
Patient declined meds, says he doesn't like taking medicine unless he absolutely needs to or has to.

## 2022-11-08 NOTE — Care Management Obs Status (Signed)
[facility] NOTIFICATION   Patient Details  Name: Donald Vaughn MRN: [id number] Date of Birth: 03-14-1951   Medicare Observation Status Notification Given:  Yes    Tom-Johnson, Renea Ee, RN 11/08/2022, 9:18 AM

## 2022-11-08 NOTE — Discharge Instructions (Addendum)
[facility]  LAPAROSCOPIC SURGERY: POST OP INSTRUCTIONS Always review your discharge instruction sheet given to you by the facility where your surgery was performed. IF YOU HAVE DISABILITY OR FAMILY LEAVE FORMS, YOU MUST BRING THEM TO THE OFFICE FOR PROCESSING.   DO NOT GIVE THEM TO YOUR DOCTOR.  PAIN CONTROL  Pain regimen: take over-the-counter tylenol (acetaminophen) 1000mg  every six hours and the robaxin (methocarbamol) 750mg  every six hours. With both of these, you should be taking something every three hours. Example: tylenol (acetaminophen) at 9am, robaxin (methocarbamol) at 12pm, tylenol (acetaminophen) again at 3pm, robaxin (methocarbamol) at 6pm. You also have a prescription for oxycodone, which should be taken if the tylenol (acetaminophen) and robaxin (methocarbamol) are not enough to control your pain. You may take the oxycodone as frequently as every four hours as needed, but if you are taking the other medications as above, you should not need the oxycodone this frequently. You have also been given a prescription for colace (docusate) which is a stool softener. Please take this as prescribed because the oxycodone can cause constipation and the colace (docusate) will minimize or prevent constipation. Do not drive while taking or under the influence of the oxycodone as it is a narcotic medication. Use ice packs to help control pain. If you need a refill on your pain medication, please contact your pharmacy.  They will contact our office to request authorization. Prescriptions will not be filled after 5pm or on week-ends.  HOME MEDICATIONS Take your usually prescribed medications unless otherwise directed.  DIET You should follow a light diet the first few days after arrival home.  Be sure to include lots of fluids daily.   CONSTIPATION It is common to experience some constipation after surgery and if you are taking pain medication.  Increasing fluid intake and  taking a stool softener (such as Colace) will usually help or prevent this problem from occurring.  A mild laxative (Milk of Magnesia or Miralax) should be taken according to package instructions if there are no bowel movements after 48 hours.  WOUND/INCISION CARE Most patients will experience some swelling and bruising in the area of the incisions.  Ice packs will help.  Swelling and bruising can take several days to resolve.  Sponge baths/bird baths are preferred, but may shower beginning 11/08/2022 as long as drain and drain site are covered with saran wrap.  Do not peel off or scrub skin glue. Do not soak in any water (tubs, hot tubs, pools, lakes, oceans) for one week.   ACTIVITIES You may resume regular (light) daily activities beginning the next day--such as daily self-care, walking, climbing stairs--gradually increasing activities as tolerated.  You may have sexual intercourse when it is comfortable.   No lifting greater than 5 pounds for six weeks.  You may drive when you are no longer taking narcotic pain medication, you can comfortably wear a seatbelt, and you can safely maneuver your car and apply brakes.  FOLLOW-UP You should see your doctor in the office for a follow-up appointment approximately 2-3 weeks after your surgery.  You should have been given your post-op/follow-up appointment when your surgery was scheduled.  If you did not receive a post-op/follow-up appointment, make sure that you call for this appointment within a day or two after you arrive home to insure a convenient appointment time.  WHEN TO CALL YOUR DOCTOR: Fever over 101.5 Inability to urinate Continued bleeding from incision. Increased pain, redness, or drainage from the incision. Increasing abdominal  pain  The clinic staff is available to answer your questions during regular business hours.  Please don't hesitate to call and ask to speak to one of the nurses for clinical concerns.  If you have a medical  emergency, go to the nearest emergency room or call 911.  A surgeon from [facility] is always on call at the hospital. [address], [place], [place], [place]  [address] ? [address] [place], [place],    [address] [phone] ? [phone] ? FAX [phone] Web site: [email or web address]

## 2022-11-11 LAB — SURGICAL PATHOLOGY

## 2022-11-14 ENCOUNTER — Emergency Department (HOSPITAL_COMMUNITY): Payer: Medicare Other

## 2022-11-14 ENCOUNTER — Inpatient Hospital Stay (HOSPITAL_COMMUNITY)
Admission: EM | Admit: 2022-11-14 | Discharge: 2022-11-21 | DRG: 445 | Disposition: A | Discharge status: Home / Self Care | Payer: Medicare Other | Attending: General Surgery | Admitting: General Surgery

## 2022-11-14 ENCOUNTER — Other Ambulatory Visit: Payer: Self-pay

## 2022-11-14 DIAGNOSIS — H9190 Unspecified hearing loss, unspecified ear: Secondary | ICD-10-CM | POA: Diagnosis present

## 2022-11-14 DIAGNOSIS — K219 Gastro-esophageal reflux disease without esophagitis: Secondary | ICD-10-CM | POA: Diagnosis present

## 2022-11-14 DIAGNOSIS — Z83438 Family history of other disorder of lipoprotein metabolism and other lipidemia: Secondary | ICD-10-CM

## 2022-11-14 DIAGNOSIS — N400 Enlarged prostate without lower urinary tract symptoms: Secondary | ICD-10-CM | POA: Diagnosis present

## 2022-11-14 DIAGNOSIS — Z882 Allergy status to sulfonamides status: Secondary | ICD-10-CM

## 2022-11-14 DIAGNOSIS — G8918 Other acute postprocedural pain: Secondary | ICD-10-CM | POA: Diagnosis present

## 2022-11-14 DIAGNOSIS — Z885 Allergy status to narcotic agent status: Secondary | ICD-10-CM

## 2022-11-14 DIAGNOSIS — Z8249 Family history of ischemic heart disease and other diseases of the circulatory system: Secondary | ICD-10-CM

## 2022-11-14 DIAGNOSIS — Z833 Family history of diabetes mellitus: Secondary | ICD-10-CM

## 2022-11-14 DIAGNOSIS — K8050 Calculus of bile duct without cholangitis or cholecystitis without obstruction: Principal | ICD-10-CM | POA: Diagnosis present

## 2022-11-14 DIAGNOSIS — I129 Hypertensive chronic kidney disease with stage 1 through stage 4 chronic kidney disease, or unspecified chronic kidney disease: Secondary | ICD-10-CM | POA: Diagnosis present

## 2022-11-14 DIAGNOSIS — M1990 Unspecified osteoarthritis, unspecified site: Secondary | ICD-10-CM | POA: Diagnosis present

## 2022-11-14 DIAGNOSIS — N1832 Chronic kidney disease, stage 3b: Secondary | ICD-10-CM | POA: Diagnosis present

## 2022-11-14 DIAGNOSIS — R7401 Elevation of levels of liver transaminase levels: Secondary | ICD-10-CM | POA: Diagnosis present

## 2022-11-14 DIAGNOSIS — R1011 Right upper quadrant pain: Principal | ICD-10-CM

## 2022-11-14 DIAGNOSIS — E669 Obesity, unspecified: Secondary | ICD-10-CM | POA: Diagnosis present

## 2022-11-14 DIAGNOSIS — Y838 Other surgical procedures as the cause of abnormal reaction of the patient, or of later complication, without mention of misadventure at the time of the procedure: Secondary | ICD-10-CM | POA: Diagnosis present

## 2022-11-14 DIAGNOSIS — Z85828 Personal history of other malignant neoplasm of skin: Secondary | ICD-10-CM

## 2022-11-14 DIAGNOSIS — R7303 Prediabetes: Secondary | ICD-10-CM | POA: Diagnosis present

## 2022-11-14 DIAGNOSIS — Z6830 Body mass index (BMI) 30.0-30.9, adult: Secondary | ICD-10-CM

## 2022-11-14 DIAGNOSIS — R7989 Other specified abnormal findings of blood chemistry: Secondary | ICD-10-CM | POA: Diagnosis present

## 2022-11-14 DIAGNOSIS — L259 Unspecified contact dermatitis, unspecified cause: Secondary | ICD-10-CM | POA: Diagnosis present

## 2022-11-14 DIAGNOSIS — Z820 Family history of epilepsy and other diseases of the nervous system: Secondary | ICD-10-CM

## 2022-11-14 DIAGNOSIS — Z79899 Other long term (current) drug therapy: Secondary | ICD-10-CM

## 2022-11-14 DIAGNOSIS — Z9049 Acquired absence of other specified parts of digestive tract: Secondary | ICD-10-CM

## 2022-11-14 DIAGNOSIS — Z91018 Allergy to other foods: Secondary | ICD-10-CM

## 2022-11-14 DIAGNOSIS — E7800 Pure hypercholesterolemia, unspecified: Secondary | ICD-10-CM | POA: Diagnosis present

## 2022-11-14 DIAGNOSIS — Z807 Family history of other malignant neoplasms of lymphoid, hematopoietic and related tissues: Secondary | ICD-10-CM

## 2022-11-14 DIAGNOSIS — T85698A Other mechanical complication of other specified internal prosthetic devices, implants and grafts, initial encounter: Secondary | ICD-10-CM | POA: Diagnosis present

## 2022-11-14 LAB — CBC WITH DIFFERENTIAL/PLATELET
Abs Immature Granulocytes: 0.06 10*3/uL (ref 0.00–0.07)
Basophils Absolute: 0.1 10*3/uL (ref 0.0–0.1)
Basophils Relative: 1 %
Eosinophils Absolute: 0.9 10*3/uL — ABNORMAL HIGH (ref 0.0–0.5)
Eosinophils Relative: 8 %
HCT: 40.1 % (ref 39.0–52.0)
Hemoglobin: 13.1 g/dL (ref 13.0–17.0)
Immature Granulocytes: 1 %
Lymphocytes Relative: 23 %
Lymphs Abs: 2.5 10*3/uL (ref 0.7–4.0)
MCH: 30.9 pg (ref 26.0–34.0)
MCHC: 32.7 g/dL (ref 30.0–36.0)
MCV: 94.6 fL (ref 80.0–100.0)
Monocytes Absolute: 1.2 10*3/uL — ABNORMAL HIGH (ref 0.1–1.0)
Monocytes Relative: 11 %
Neutro Abs: 6.4 10*3/uL (ref 1.7–7.7)
Neutrophils Relative %: 56 %
Platelets: 254 10*3/uL (ref 150–400)
RBC: 4.24 MIL/uL (ref 4.22–5.81)
RDW: 12.8 % (ref 11.5–15.5)
WBC: 11.1 10*3/uL — ABNORMAL HIGH (ref 4.0–10.5)
nRBC: 0 % (ref 0.0–0.2)

## 2022-11-14 LAB — COMPREHENSIVE METABOLIC PANEL
ALT: 133 U/L — ABNORMAL HIGH (ref 0–44)
AST: 129 U/L — ABNORMAL HIGH (ref 15–41)
Albumin: 3.3 g/dL — ABNORMAL LOW (ref 3.5–5.0)
Alkaline Phosphatase: 218 U/L — ABNORMAL HIGH (ref 38–126)
Anion gap: 11 (ref 5–15)
BUN: 35 mg/dL — ABNORMAL HIGH (ref 8–23)
CO2: 25 mmol/L (ref 22–32)
Calcium: 8.8 mg/dL — ABNORMAL LOW (ref 8.9–10.3)
Chloride: 100 mmol/L (ref 98–111)
Creatinine, Ser: 2.17 mg/dL — ABNORMAL HIGH (ref 0.61–1.24)
GFR, Estimated: 32 mL/min — ABNORMAL LOW (ref >60)
Glucose, Bld: 144 mg/dL — ABNORMAL HIGH (ref 70–99)
Potassium: 3.4 mmol/L — ABNORMAL LOW (ref 3.5–5.1)
Sodium: 136 mmol/L (ref 135–145)
Total Bilirubin: 1.3 mg/dL — ABNORMAL HIGH (ref 0.3–1.2)
Total Protein: 7 g/dL (ref 6.5–8.1)

## 2022-11-14 LAB — URINALYSIS, ROUTINE W REFLEX MICROSCOPIC
Bacteria, UA: NONE SEEN
Bilirubin Urine: NEGATIVE
Glucose, UA: NEGATIVE mg/dL
Hgb urine dipstick: NEGATIVE
Ketones, ur: NEGATIVE mg/dL
Leukocytes,Ua: NEGATIVE
Nitrite: NEGATIVE
Protein, ur: NEGATIVE mg/dL
Specific Gravity, Urine: 1.006 (ref 1.005–1.030)
pH: 6 (ref 5.0–8.0)

## 2022-11-14 LAB — LIPASE, BLOOD: Lipase: 34 U/L (ref 11–51)

## 2022-11-14 MED ORDER — HYDROCODONE-ACETAMINOPHEN 5-325 MG PO TABS
1.0000 | ORAL_TABLET | Freq: Once | ORAL | Status: AC
  Administered 2022-11-14: 1 via ORAL
  Filled 2022-11-14: qty 1

## 2022-11-14 NOTE — ED Triage Notes (Signed)
Pt reports gallbladder with surgery on the 29th. Reports since waking up this morning he has had decreased drainage from his drain on the right abdomen and increasing abdominal pain at the site that increased tonight. Denies vomiting. Last oxycodone was last night.

## 2022-11-14 NOTE — ED Provider Triage Note (Signed)
Emergency Medicine Provider Triage Evaluation Note  Donald Vaughn , a 72 y.o. male  was evaluated in triage.  Pt complains of abdominal pain postsurgery.  Patient has gallbladder out 11/07/2022 but woke up this morning with excruciating abdominal pain and not able to eat due to the pain.  Patient denied nausea vomiting but endorses severe 10 out of 10 pain at the site.  Patient states the drain that was placed is draining blood.  Patient has Percocet at home however has not taken any today.  Patient denies fevers, chills, chest pain, shortness of breath, skin color changes, dysuria  Review of Systems  Positive: See HPI Negative: See HPI  Physical Exam  There were no vitals taken for this visit. Gen:   Awake, in distress Resp:  Normal effort  MSK:   Moves extremities without difficulty  Other:  Abdomen tender to palpation everywhere, to place in right upper quadrant draining blood, no overlying skin color changes, abdomen distended but not rigid  Medical Decision Making  Medically screening exam initiated at 8:37 PM.  Appropriate orders placed.  [name] was informed that the remainder of the evaluation will be completed by another provider, this initial triage assessment does not replace that evaluation, and the importance of remaining in the ED until their evaluation is complete.  Workup initiated, patient be given a Norco and sent to the back due to severe pain, patient stable at this time   [name] 11/14/22 2040

## 2022-11-14 NOTE — ED Provider Notes (Signed)
[facility] EMERGENCY DEPARTMENT AT [facility] Provider Note   CSN: [id number] Arrival date & time: 11/14/22  2031     History {Add pertinent medical, surgical, social history, OB history to HPI:1} Chief Complaint  Patient presents with  . Abdominal Pain    Donald Vaughn is a 72 y.o. male.   Abdominal Pain      Home Medications Prior to Admission medications   Medication Sig Start Date End Date Taking? Authorizing Provider  acetaminophen (TYLENOL) 500 MG tablet Take 2 tablets (1,000 mg total) by mouth every 6 (six) hours. 11/08/22   Diamantina Monks, MD  alum & mag hydroxide-simeth (MAALOX MAX) 400-400-40 MG/5ML suspension Take 10 mLs by mouth every 6 (six) hours as needed for indigestion. 09/22/22   Nira Conn, MD  CALCIUM PO Take 1 tablet by mouth daily.    [provider]  docusate sodium (COLACE) 100 MG capsule Take 1 capsule (100 mg total) by mouth 2 (two) times daily. 11/08/22   Diamantina Monks, MD  famotidine (PEPCID) 20 MG tablet Take 1 tablet (20 mg total) by mouth 2 (two) times daily. 12/25/16   Devoria Albe, MD  Hyaluronic Acid-Vitamin C (HYALURONIC ACID PO) Take 1 tablet by mouth daily.    [provider]  lidocaine (XYLOCAINE) 2 % solution Use as directed 10 mLs in the mouth or throat every 6 (six) hours as needed (stomach pain). 09/22/22   Nira Conn, MD  losartan-hydrochlorothiazide (HYZAAR) 100-12.5 MG tablet Take 0.5 tablets by mouth daily.    [provider]  methocarbamol (ROBAXIN-750) 750 MG tablet Take 1 tablet (750 mg total) by mouth 4 (four) times daily. 11/08/22   Diamantina Monks, MD  Multiple Vitamins-Minerals (PRESERVISION AREDS PO) Take 1 tablet by mouth daily.    [provider]  oxyCODONE (OXY IR/ROXICODONE) 5 MG immediate release tablet Take 1 tablet (5 mg total) by mouth every 4 (four) hours as needed for severe pain. 11/08/22   Diamantina Monks, MD  predniSONE (DELTASONE) 20 MG tablet  Take 3 po QD x 3d , then 2 po QD x 3d then 1 po QD x 3d 12/25/16   Devoria Albe, MD      Allergies    Cats claw Montez Hageman tomentosa (cats claw)], Codeine, Oxycodone-aspirin, Peanut butter flavor, Septra [sulfamethoxazole-trimethoprim], and Sulfonamide derivatives    Review of Systems   Review of Systems  Gastrointestinal:  Positive for abdominal pain.    Physical Exam Updated Vital Signs BP 100/80 (BP Location: Left Arm)   Pulse 90   Temp (!) 97.3 F (36.3 C) (Oral)   Resp (!) 22   SpO2 96%  Physical Exam  ED Results / Procedures / Treatments   Labs (all labs ordered are listed, but only abnormal results are displayed) Labs Reviewed  CBC WITH DIFFERENTIAL/PLATELET  COMPREHENSIVE METABOLIC PANEL  LIPASE, BLOOD  URINALYSIS, ROUTINE W REFLEX MICROSCOPIC    EKG None  Radiology No results found.  Procedures Procedures  {Document cardiac monitor, telemetry assessment procedure when appropriate:1}  Medications Ordered in ED Medications  HYDROcodone-acetaminophen (NORCO/VICODIN) 5-325 MG per tablet 1 tablet (1 tablet Oral Given 11/14/22 2054)    ED Course/ Medical Decision Making/ A&P   {   Click here for ABCD2, HEART and other calculatorsREFRESH Note before signing :1}                          Medical Decision Making  ***  {  Document critical care time when appropriate:1} {Document review of labs and clinical decision tools ie heart score, Chads2Vasc2 etc:1}  {Document your independent review of radiology images, and any outside records:1} {Document your discussion with family members, caretakers, and with consultants:1} {Document social determinants of health affecting pt's care:1} {Document your decision making why or why not admission, treatments were needed:1} Final Clinical Impression(s) / ED Diagnoses Final diagnoses:  None    Rx / DC Orders ED Discharge Orders     None

## 2022-11-15 ENCOUNTER — Inpatient Hospital Stay (HOSPITAL_COMMUNITY): Payer: Medicare Other

## 2022-11-15 DIAGNOSIS — Z8249 Family history of ischemic heart disease and other diseases of the circulatory system: Secondary | ICD-10-CM | POA: Diagnosis not present

## 2022-11-15 DIAGNOSIS — R7401 Elevation of levels of liver transaminase levels: Secondary | ICD-10-CM | POA: Diagnosis present

## 2022-11-15 DIAGNOSIS — E7800 Pure hypercholesterolemia, unspecified: Secondary | ICD-10-CM | POA: Diagnosis present

## 2022-11-15 DIAGNOSIS — Z882 Allergy status to sulfonamides status: Secondary | ICD-10-CM | POA: Diagnosis not present

## 2022-11-15 DIAGNOSIS — Z807 Family history of other malignant neoplasms of lymphoid, hematopoietic and related tissues: Secondary | ICD-10-CM | POA: Diagnosis not present

## 2022-11-15 DIAGNOSIS — N400 Enlarged prostate without lower urinary tract symptoms: Secondary | ICD-10-CM | POA: Diagnosis present

## 2022-11-15 DIAGNOSIS — G8918 Other acute postprocedural pain: Secondary | ICD-10-CM | POA: Diagnosis present

## 2022-11-15 DIAGNOSIS — Z85828 Personal history of other malignant neoplasm of skin: Secondary | ICD-10-CM | POA: Diagnosis not present

## 2022-11-15 DIAGNOSIS — Z820 Family history of epilepsy and other diseases of the nervous system: Secondary | ICD-10-CM | POA: Diagnosis not present

## 2022-11-15 DIAGNOSIS — I129 Hypertensive chronic kidney disease with stage 1 through stage 4 chronic kidney disease, or unspecified chronic kidney disease: Secondary | ICD-10-CM | POA: Diagnosis present

## 2022-11-15 DIAGNOSIS — Z79899 Other long term (current) drug therapy: Secondary | ICD-10-CM | POA: Diagnosis not present

## 2022-11-15 DIAGNOSIS — R7303 Prediabetes: Secondary | ICD-10-CM | POA: Diagnosis present

## 2022-11-15 DIAGNOSIS — L259 Unspecified contact dermatitis, unspecified cause: Secondary | ICD-10-CM | POA: Diagnosis present

## 2022-11-15 DIAGNOSIS — Z91018 Allergy to other foods: Secondary | ICD-10-CM | POA: Diagnosis not present

## 2022-11-15 DIAGNOSIS — K219 Gastro-esophageal reflux disease without esophagitis: Secondary | ICD-10-CM | POA: Diagnosis present

## 2022-11-15 DIAGNOSIS — H9190 Unspecified hearing loss, unspecified ear: Secondary | ICD-10-CM | POA: Diagnosis present

## 2022-11-15 DIAGNOSIS — M1990 Unspecified osteoarthritis, unspecified site: Secondary | ICD-10-CM | POA: Diagnosis present

## 2022-11-15 DIAGNOSIS — N1832 Chronic kidney disease, stage 3b: Secondary | ICD-10-CM | POA: Diagnosis present

## 2022-11-15 DIAGNOSIS — K8050 Calculus of bile duct without cholangitis or cholecystitis without obstruction: Secondary | ICD-10-CM | POA: Diagnosis not present

## 2022-11-15 DIAGNOSIS — Y838 Other surgical procedures as the cause of abnormal reaction of the patient, or of later complication, without mention of misadventure at the time of the procedure: Secondary | ICD-10-CM | POA: Diagnosis present

## 2022-11-15 DIAGNOSIS — T85698A Other mechanical complication of other specified internal prosthetic devices, implants and grafts, initial encounter: Secondary | ICD-10-CM | POA: Diagnosis present

## 2022-11-15 DIAGNOSIS — Z9049 Acquired absence of other specified parts of digestive tract: Secondary | ICD-10-CM | POA: Diagnosis not present

## 2022-11-15 DIAGNOSIS — R7989 Other specified abnormal findings of blood chemistry: Secondary | ICD-10-CM | POA: Diagnosis present

## 2022-11-15 DIAGNOSIS — E669 Obesity, unspecified: Secondary | ICD-10-CM | POA: Diagnosis present

## 2022-11-15 DIAGNOSIS — Z885 Allergy status to narcotic agent status: Secondary | ICD-10-CM | POA: Diagnosis not present

## 2022-11-15 DIAGNOSIS — Z83438 Family history of other disorder of lipoprotein metabolism and other lipidemia: Secondary | ICD-10-CM | POA: Diagnosis not present

## 2022-11-15 LAB — COMPREHENSIVE METABOLIC PANEL
ALT: 119 U/L — ABNORMAL HIGH (ref 0–44)
AST: 85 U/L — ABNORMAL HIGH (ref 15–41)
Albumin: 3.1 g/dL — ABNORMAL LOW (ref 3.5–5.0)
Alkaline Phosphatase: 208 U/L — ABNORMAL HIGH (ref 38–126)
Anion gap: 12 (ref 5–15)
BUN: 37 mg/dL — ABNORMAL HIGH (ref 8–23)
CO2: 26 mmol/L (ref 22–32)
Calcium: 8.7 mg/dL — ABNORMAL LOW (ref 8.9–10.3)
Chloride: 98 mmol/L (ref 98–111)
Creatinine, Ser: 2.32 mg/dL — ABNORMAL HIGH (ref 0.61–1.24)
GFR, Estimated: 29 mL/min — ABNORMAL LOW (ref >60)
Glucose, Bld: 132 mg/dL — ABNORMAL HIGH (ref 70–99)
Potassium: 4.3 mmol/L (ref 3.5–5.1)
Sodium: 136 mmol/L (ref 135–145)
Total Bilirubin: 1.4 mg/dL — ABNORMAL HIGH (ref 0.3–1.2)
Total Protein: 6.6 g/dL (ref 6.5–8.1)

## 2022-11-15 LAB — CBC
HCT: 38 % — ABNORMAL LOW (ref 39.0–52.0)
Hemoglobin: 13 g/dL (ref 13.0–17.0)
MCH: 31.9 pg (ref 26.0–34.0)
MCHC: 34.2 g/dL (ref 30.0–36.0)
MCV: 93.1 fL (ref 80.0–100.0)
Platelets: 197 10*3/uL (ref 150–400)
RBC: 4.08 MIL/uL — ABNORMAL LOW (ref 4.22–5.81)
RDW: 12.9 % (ref 11.5–15.5)
WBC: 10 10*3/uL (ref 4.0–10.5)
nRBC: 0 % (ref 0.0–0.2)

## 2022-11-15 MED ORDER — TECHNETIUM TC 99M MEBROFENIN IV KIT
5.4000 | PACK | Freq: Once | INTRAVENOUS | Status: AC | PRN
  Administered 2022-11-15: 5.4 via INTRAVENOUS

## 2022-11-15 MED ORDER — OXYCODONE HCL 5 MG PO TABS
5.0000 mg | ORAL_TABLET | ORAL | Status: DC | PRN
  Administered 2022-11-17: 5 mg via ORAL
  Administered 2022-11-18 – 2022-11-19 (×2): 10 mg via ORAL
  Filled 2022-11-15: qty 2
  Filled 2022-11-15: qty 1
  Filled 2022-11-15 (×4): qty 2

## 2022-11-15 MED ORDER — PANTOPRAZOLE SODIUM 40 MG IV SOLR
40.0000 mg | Freq: Every day | INTRAVENOUS | Status: DC
  Administered 2022-11-15 – 2022-11-20 (×6): 40 mg via INTRAVENOUS
  Filled 2022-11-15 (×6): qty 10

## 2022-11-15 MED ORDER — ENOXAPARIN SODIUM 30 MG/0.3ML IJ SOSY
30.0000 mg | PREFILLED_SYRINGE | INTRAMUSCULAR | Status: DC
  Administered 2022-11-15 – 2022-11-17 (×3): 30 mg via SUBCUTANEOUS
  Filled 2022-11-15 (×3): qty 0.3

## 2022-11-15 MED ORDER — POTASSIUM CHLORIDE IN NACL 20-0.9 MEQ/L-% IV SOLN
INTRAVENOUS | Status: DC
  Filled 2022-11-15 (×8): qty 1000

## 2022-11-15 MED ORDER — ACETAMINOPHEN 650 MG RE SUPP: 650.0000 mg | Freq: Four times a day (QID) | RECTAL | Status: DC | PRN

## 2022-11-15 MED ORDER — MORPHINE SULFATE (PF) 4 MG/ML IV SOLN
4.0000 mg | INTRAVENOUS | Status: DC | PRN
  Administered 2022-11-15 – 2022-11-17 (×2): 4 mg via INTRAVENOUS
  Filled 2022-11-15 (×3): qty 1

## 2022-11-15 MED ORDER — ONDANSETRON HCL 4 MG/2ML IJ SOLN
4.0000 mg | Freq: Four times a day (QID) | INTRAMUSCULAR | Status: DC | PRN
  Administered 2022-11-15 – 2022-11-20 (×7): 4 mg via INTRAVENOUS
  Filled 2022-11-15 (×7): qty 2

## 2022-11-15 MED ORDER — ACETAMINOPHEN 325 MG PO TABS
650.0000 mg | ORAL_TABLET | Freq: Four times a day (QID) | ORAL | Status: DC | PRN
  Administered 2022-11-15 – 2022-11-17 (×2): 650 mg via ORAL
  Filled 2022-11-15 (×2): qty 2

## 2022-11-15 MED ORDER — ONDANSETRON 4 MG PO TBDP: 4.0000 mg | ORAL_TABLET | Freq: Four times a day (QID) | ORAL | Status: DC | PRN

## 2022-11-15 NOTE — Progress Notes (Signed)
Interventional Radiology Brief Note:  Donald Vaughn is a 72 year old man s/p lap cholecystectomy 11/07/22.  He was discharged home the following day with surgical JP drain in place in the gall bladder fossa.  Patient initially had small amount output daily however with no output in the 1-2 days with interval development of RUQ abdominal pain.  He returned the ED for evaluation.   CT Abdomen Pelvis performed yesterday and was read as  6.3 x 2.6 x 2.9 cm fluid collection with small air pockets around and extending above the drain in the gallbladder fossa. This could be normal postoperative fluid and esidual air but could also indicate evidence of a developing abscess or biloma.  IR consulted for additional drain vs. Drain exchange by surgery.   Imaging and case reviewed by Dr. Bryn Gulling.  Additional a NM HIDA was performed this AM.  It does not show evidence of biliary leak.  Careful review of CT imaging today by Dr. Bryn Gulling who notes that the noted "collection" on imaging may actually represent soft tissue thickening. In the absence of fluid by imaging, consistent with report of patient's decreased drain output, no additional drain by IR at this time.   Dr. Bryn Gulling has discussed with Dr. Royanne Foots.   Loyce Dys, MS RD PA-C

## 2022-11-15 NOTE — Progress Notes (Signed)
Progress Note     Subjective: Has sipped on some clear liquids without significant pain. Has now had copious drainage around drain tubing   Objective: Vital signs in last 24 hours: Temp:  [97.3 F (36.3 C)-98.3 F (36.8 C)] 98.2 F (36.8 C) (04/06 0816) Pulse Rate:  [70-90] 71 (04/06 0816) Resp:  [12-22] 18 (04/06 0816) BP: (100-137)/(58-80) 106/59 (04/06 0816) SpO2:  [96 %-100 %] 96 % (04/06 0816) Last BM Date : 11/14/22  Intake/Output from previous day: No intake/output data recorded. Intake/Output this shift: Total I/O In: -  Out: 20 [Drains:20]  PE: General: pleasant, WD, male who is laying in bed in NAD Lungs:  Respiratory effort nonlabored Abd: soft, NT, incisions c/d/I. Drain with scant cloudy drainage. Bandage with greenish brown drainage MSK: all 4 extremities are symmetrical with no cyanosis, clubbing, or edema. Skin: warm and dry Psych: A&Ox3 with an appropriate affect.    Lab Results:  Recent Labs    11/14/22 2048 11/15/22 0215  WBC 11.1* 10.0  HGB 13.1 13.0  HCT 40.1 38.0*  PLT 254 197   BMET Recent Labs    11/14/22 2048 11/15/22 0215  NA 136 136  K 3.4* 4.3  CL 100 98  CO2 25 26  GLUCOSE 144* 132*  BUN 35* 37*  CREATININE 2.17* 2.32*  CALCIUM 8.8* 8.7*   PT/INR No results for input(s): "LABPROT", "INR" in the last 72 hours. CMP     Component Value Date/Time   NA 136 11/15/2022 0215   K 4.3 11/15/2022 0215   CL 98 11/15/2022 0215   CO2 26 11/15/2022 0215   GLUCOSE 132 (H) 11/15/2022 0215   BUN 37 (H) 11/15/2022 0215   CREATININE 2.32 (H) 11/15/2022 0215   CALCIUM 8.7 (L) 11/15/2022 0215   PROT 6.6 11/15/2022 0215   ALBUMIN 3.1 (L) 11/15/2022 0215   AST 85 (H) 11/15/2022 0215   ALT 119 (H) 11/15/2022 0215   ALKPHOS 208 (H) 11/15/2022 0215   BILITOT 1.4 (H) 11/15/2022 0215   GFRNONAA 29 (L) 11/15/2022 0215   GFRAA 41 (L) 12/25/2016 0016   Lipase     Component Value Date/Time   LIPASE 34 11/14/2022 2048        Studies/Results: CT ABDOMEN PELVIS WO CONTRAST  Result Date: 11/14/2022 CLINICAL DATA:  Seven days postoperative from 11/07/2022 cholecystectomy, presents with postoperative right upper quadrant pain, no output from surgical drain. EXAM: CT ABDOMEN AND PELVIS WITHOUT CONTRAST TECHNIQUE: Multidetector CT imaging of the abdomen and pelvis was performed following the standard protocol without IV contrast. RADIATION DOSE REDUCTION: This exam was performed according to the departmental dose-optimization program which includes automated exposure control, adjustment of the mA and/or kV according to patient size and/or use of iterative reconstruction technique. COMPARISON:  CTs without contrast 09/22/2022 and 04/01/2022. FINDINGS: Lower chest: There is a new small layering right and trace left pleural effusions, mild adjacent atelectasis in the posterior lungs. There are scattered ground-glass disease in the lung bases but no acute pneumonic infiltrate. The cardiac size is normal. Hepatobiliary: No focal abnormality is seen in the unenhanced liver. No pneumobilia. Small rim of anterior peripancreatic fluid is noted and interval cholecystectomy with clips in the gallbladder fossa. A surgical drain enters the right abdomen from the anterolateral aspect just below the ribcage, courses cephalad than medially extending underneath the left lobe of the liver then turns laterally into the gallbladder fossa region. There is a fluid collection with small air pockets around and extending above the  drain in the gallbladder fossa, measuring 6.3 x 2.6 by 2.9 cm. This could be normal postoperative fluid and residual air but could also indicate evidence of a developing abscess or biloma. Clinical correlation is advised. There is additional trace air in the porta hepatis to the left. Pancreas: Partially atrophic and otherwise unremarkable. Spleen: Normal size and noncontrast attenuation. Adrenals/Urinary Tract: No adrenal  mass. Asymmetric volume loss and small size of the right kidney is again shown. Parapelvic left renal cysts are again noted up to 3.8 cm, stable. There are a few punctate nonobstructive caliceal stones in the inferior pole of the left kidney. On the right there is a 6.5 mm stone which was previously in an interpolar infundibulum and has moved more medially into the renal pelvis just above the UPJ, but there is no hydronephrosis. This potentially could intermittently obstruct the UPJ. Again noted is a nonobstructive 2 mm caliceal stone in the right inferior pole. No renal masses seen without contrast. Both ureters are small caliber without obstructing stones. There is no bladder thickening. Stomach/Bowel: No dilatation, wall thickening or inflammatory changes of the stomach, small bowel and appendix. Mild fecal stasis ascending and transverse colon. Diverticulosis is seen greatest in the descending and sigmoid region. There is new faint inflammatory stranding alongside a short-segment of the mid sigmoid colon on 3: 76-78 and on 6: 64-69 compatible with a mild acute short segment diverticulitis. There is no abscess or free air. Vascular/Lymphatic: Aortic atherosclerosis. No enlarged abdominal or pelvic lymph nodes. Reproductive: Prostate is unremarkable. Other: In addition to a small amount of perihepatic fluid there is additional free fluid, also small amount in the distal right paracolic gutter. No free air is seen outside the in the gallbladder fossa. There is no free hemorrhage or further free fluid. There are pelvic phleboliths. There is no incarcerated hernia. Musculoskeletal: Osteopenia and degenerative change of the spine. Postsurgical change of umbilical hernia repair with old mesh screws. IMPRESSION: 1. Interval cholecystectomy with a surgical drain in the gallbladder fossa. There is a 6.3 x 2.6 x 2.9 cm fluid collection with small air pockets around and extending above the drain in the gallbladder fossa.  This could be normal postoperative fluid and residual air but could also indicate evidence of a developing abscess or biloma. Follow-up as indicated. 2. Small rim of anterior perihepatic fluid and additional small amount of fluid in the right paracolic gutter. 3. New small right and trace left pleural effusions with adjacent atelectasis. 4. Mild acute short segment mid sigmoid diverticulitis. No abscess or free air. 5. 6.5 mm stone in the right renal pelvis just above the UPJ, without hydronephrosis. This could intermittently obstruct the UPJ but at present there is no hydronephrosis. 6. Additional nonobstructive micronephrolithiasis, asymmetric right renal atrophy and scarring. 7. Aortic atherosclerosis. 8. Constipation. 9. Osteopenia and degenerative change. Aortic Atherosclerosis (ICD10-I70.0). Electronically Signed   By: Almira Bar M.D.   On: 11/14/2022 21:45    Anti-infectives: Anti-infectives (From admission, onward)    None        Assessment/Plan  Status post laparoscopic cholecystectomy 11/07/2022 Possible postoperative bile leak    - HIDA today - continue CLD and IVF - IR consult for drainage  FEN: NPO for IR eval. Can otherwise have CLD ID: none currently VTE: lovenox  CKD 3b    LOS: 0 days   Eric Form, [facility] 11/15/2022, 11:06 AM Please see Amion for pager number during day hours 7:00am-4:30pm

## 2022-11-15 NOTE — Plan of Care (Signed)

## 2022-11-15 NOTE — H&P (Signed)
Donald Vaughn is an 72 y.o. male.   Chief Complaint: RUQ pain and drain stopped putting out HPI: Donald Vaughn S/P laparoscopic cholecystectomy by Dr. Bedelia PersonLovick on 11/07/2022.  JP drain was left in place at the time of surgery.  While at home, he reports the drain has been putting out about a half a bulb of fluid every 12 hours.  It stopped putting anything out over the past 24 hours and he developed more pain in his right upper quadrant extending up to his shoulder.  He came to the emergency department.  CT scan of the abdomen pelvis shows a small fluid collection in the gallbladder fossa where the drain is.  Liver function tests are mildly elevated.  Past Medical History:  Diagnosis Date   Alcohol abuse    hx   Arthritis    BCC (basal cell carcinoma of skin)    Benign prostatic hypertrophy    hx   BPH (benign prostatic hyperplasia)    BPH (benign prostatic hyperplasia)    Chest pain, atypical    Diverticulitis    Generalized headaches    GERD (gastroesophageal reflux disease)    with history of esophagitis   Hearing loss    Hypercholesterolemia    Hypertension    Leukopenia    mild   Obese    Pre-diabetes    Rectal bleeding    Skin cancer    Tinnitus    chronic    Past Surgical History:  Procedure Laterality Date   BACK SURGERY     L4/5 Dr Eligha BridegroomAmes   CHOLECYSTECTOMY  11/07/2022   Procedure: LAPAROSCOPIC CHOLECYSTECTOMY WITH INTRAOPERATIVE CHOLANGIOGRAM;  Surgeon: Diamantina MonksLovick, Ayesha N, MD;  Location: [facility] OR;  Service: General;;   COLONOSCOPY  2/03   norma. (Dr. Virginia Rochesterrr)   EGD- normal  2/03   Dr. Virginia Rochesterrr.   HERNIA REPAIR     umbilical with PVP   left foot surgery     R shoulder surgery  4/03   Dr. Thurston HoleWainer    Family History  Problem Relation Age of Onset   Lymphoma Mother    Hypertension Mother    Hyperlipidemia Mother    Diabetes Mother    Alzheimer's disease Father    Social History:  reports that he has never smoked. He has never used smokeless tobacco. He reports that he does not  drink alcohol and does not use drugs.  Allergies:  Allergies  Allergen Reactions   Cats Claw [Uncaria Tomentosa (Cats Claw)]     allergy   Codeine     Stomach upset   Oxycodone-Aspirin     REACTION: NAUSEA AND VOMITING   Peanut Butter Flavor     Swelling/welts   Septra [Sulfamethoxazole-Trimethoprim]     rash   Sulfonamide Derivatives     REACTION: SWELLING    (Not in a hospital admission)   Results for orders placed or performed during the hospital encounter of 11/14/22 (from the past 48 hour(s))  CBC with Differential     Status: Abnormal   Collection Time: 11/14/22  8:48 PM  Result Value Ref Range   WBC 11.1 (H) 4.0 - 10.5 K/uL   RBC 4.24 4.22 - 5.81 MIL/uL   Hemoglobin 13.1 13.0 - 17.0 g/dL   HCT 11.940.1 14.739.0 - 82.952.0 %   MCV 94.6 80.0 - 100.0 fL   MCH 30.9 26.0 - 34.0 pg   MCHC 32.7 30.0 - 36.0 g/dL   RDW 56.212.8 13.011.5 - 86.515.5 %   Platelets 254  150 - 400 K/uL   nRBC 0.0 0.0 - 0.2 %   Neutrophils Relative % 56 %   Neutro Abs 6.4 1.7 - 7.7 K/uL   Lymphocytes Relative 23 %   Lymphs Abs 2.5 0.7 - 4.0 K/uL   Monocytes Relative 11 %   Monocytes Absolute 1.2 (H) 0.1 - 1.0 K/uL   Eosinophils Relative 8 %   Eosinophils Absolute 0.9 (H) 0.0 - 0.5 K/uL   Basophils Relative 1 %   Basophils Absolute 0.1 0.0 - 0.1 K/uL   Immature Granulocytes 1 %   Abs Immature Granulocytes 0.06 0.00 - 0.07 K/uL    Comment: Performed at [facility], [address] [place], [place] [address]  Comprehensive metabolic panel     Status: Abnormal   Collection Time: 11/14/22  8:48 PM  Result Value Ref Range   Sodium 136 135 - 145 mmol/L   Potassium 3.4 (L) 3.5 - 5.1 mmol/L   Chloride 100 98 - 111 mmol/L   CO2 25 22 - 32 mmol/L   Glucose, Bld 144 (H) 70 - 99 mg/dL    Comment: Glucose reference range applies only to samples taken after fasting for at least 8 hours.   BUN 35 (H) 8 - 23 mg/dL   Creatinine, Ser 7.48 (H) 0.61 - 1.24 mg/dL   Calcium 8.8 (L) 8.9 - 10.3 mg/dL   Total Protein 7.0  6.5 - 8.1 g/dL   Albumin 3.3 (L) 3.5 - 5.0 g/dL   AST 270 (H) 15 - 41 U/L   ALT 133 (H) 0 - 44 U/L   Alkaline Phosphatase 218 (H) 38 - 126 U/L   Total Bilirubin 1.3 (H) 0.3 - 1.2 mg/dL   GFR, Estimated 32 (L) >60 mL/min    Comment: (NOTE) Calculated using the CKD-EPI Creatinine Equation (2021)    Anion gap 11 5 - 15    Comment: Performed at [facility], [address], [place], [place] [address]  Lipase, blood     Status: None   Collection Time: 11/14/22  8:48 PM  Result Value Ref Range   Lipase 34 11 - 51 U/L    Comment: Performed at [facility], [address] [place], [place] [address]  Urinalysis, Routine w reflex microscopic -Urine, Clean Catch     Status: None   Collection Time: 11/14/22 10:30 PM  Result Value Ref Range   Color, Urine YELLOW YELLOW   APPearance CLEAR CLEAR   Specific Gravity, Urine 1.006 1.005 - 1.030   pH 6.0 5.0 - 8.0   Glucose, UA NEGATIVE NEGATIVE mg/dL   Hgb urine dipstick NEGATIVE NEGATIVE   Bilirubin Urine NEGATIVE NEGATIVE   Ketones, ur NEGATIVE NEGATIVE mg/dL   Protein, ur NEGATIVE NEGATIVE mg/dL   Nitrite NEGATIVE NEGATIVE   Leukocytes,Ua NEGATIVE NEGATIVE   RBC / HPF 0-5 0 - 5 RBC/hpf   WBC, UA 0-5 0 - 5 WBC/hpf   Bacteria, UA NONE SEEN NONE SEEN   Squamous Epithelial / HPF 0-5 0 - 5 /HPF   Hyaline Casts, UA PRESENT     Comment: Performed at [facility], [address] [place], [place] [address]   CT ABDOMEN PELVIS WO CONTRAST  Result Date: 11/14/2022 CLINICAL DATA:  Seven days postoperative from 11/07/2022 cholecystectomy, presents with postoperative right upper quadrant pain, no output from surgical drain. EXAM: CT ABDOMEN AND PELVIS WITHOUT CONTRAST TECHNIQUE: Multidetector CT imaging of the abdomen and pelvis was performed following the standard protocol  without IV contrast. RADIATION DOSE REDUCTION: This exam was performed according to the departmental dose-optimization program which includes automated exposure  control, adjustment of the mA and/or kV according to patient size and/or use of iterative reconstruction technique. COMPARISON:  CTs without contrast 09/22/2022 and 04/01/2022. FINDINGS: Lower chest: There is a new small layering right and trace left pleural effusions, mild adjacent atelectasis in the posterior lungs. There are scattered ground-glass disease in the lung bases but no acute pneumonic infiltrate. The cardiac size is normal. Hepatobiliary: No focal abnormality is seen in the unenhanced liver. No pneumobilia. Small rim of anterior peripancreatic fluid is noted and interval cholecystectomy with clips in the gallbladder fossa. A surgical drain enters the right abdomen from the anterolateral aspect just below the ribcage, courses cephalad than medially extending underneath the left lobe of the liver then turns laterally into the gallbladder fossa region. There is a fluid collection with small air pockets around and extending above the drain in the gallbladder fossa, measuring 6.3 x 2.6 by 2.9 cm. This could be normal postoperative fluid and residual air but could also indicate evidence of a developing abscess or biloma. Clinical correlation is advised. There is additional trace air in the porta hepatis to the left. Pancreas: Partially atrophic and otherwise unremarkable. Spleen: Normal size and noncontrast attenuation. Adrenals/Urinary Tract: No adrenal mass. Asymmetric volume loss and small size of the right kidney is again shown. Parapelvic left renal cysts are again noted up to 3.8 cm, stable. There are a few punctate nonobstructive caliceal stones in the inferior pole of the left kidney. On the right there is a 6.5 mm stone which was previously in an interpolar infundibulum and has moved more medially into the renal pelvis just above the UPJ, but there is no hydronephrosis. This potentially could intermittently obstruct the UPJ. Again noted is a nonobstructive 2 mm caliceal stone in the right inferior  pole. No renal masses seen without contrast. Both ureters are small caliber without obstructing stones. There is no bladder thickening. Stomach/Bowel: No dilatation, wall thickening or inflammatory changes of the stomach, small bowel and appendix. Mild fecal stasis ascending and transverse colon. Diverticulosis is seen greatest in the descending and sigmoid region. There is new faint inflammatory stranding alongside a short-segment of the mid sigmoid colon on 3: 76-78 and on 6: 64-69 compatible with a mild acute short segment diverticulitis. There is no abscess or free air. Vascular/Lymphatic: Aortic atherosclerosis. No enlarged abdominal or pelvic lymph nodes. Reproductive: Prostate is unremarkable. Other: In addition to a small amount of perihepatic fluid there is additional free fluid, also small amount in the distal right paracolic gutter. No free air is seen outside the in the gallbladder fossa. There is no free hemorrhage or further free fluid. There are pelvic phleboliths. There is no incarcerated hernia. Musculoskeletal: Osteopenia and degenerative change of the spine. Postsurgical change of umbilical hernia repair with old mesh screws. IMPRESSION: 1. Interval cholecystectomy with a surgical drain in the gallbladder fossa. There is a 6.3 x 2.6 x 2.9 cm fluid collection with small air pockets around and extending above the drain in the gallbladder fossa. This could be normal postoperative fluid and residual air but could also indicate evidence of a developing abscess or biloma. Follow-up as indicated. 2. Small rim of anterior perihepatic fluid and additional small amount of fluid in the right paracolic gutter. 3. New small right and trace left pleural effusions with adjacent atelectasis. 4. Mild acute short segment mid sigmoid diverticulitis. No abscess or  free air. 5. 6.5 mm stone in the right renal pelvis just above the UPJ, without hydronephrosis. This could intermittently obstruct the UPJ but at present  there is no hydronephrosis. 6. Additional nonobstructive micronephrolithiasis, asymmetric right renal atrophy and scarring. 7. Aortic atherosclerosis. 8. Constipation. 9. Osteopenia and degenerative change. Aortic Atherosclerosis (ICD10-I70.0). Electronically Signed   By: Almira Bar Vaughn.D.   On: 11/14/2022 21:45    Review of Systems  Blood pressure 118/66, pulse 71, temperature (!) 97.3 F (36.3 C), temperature source Oral, resp. rate 13, SpO2 98 %. Physical Exam HENT:     Head: Normocephalic.  Cardiovascular:     Rate and Rhythm: Normal rate and regular rhythm.  Pulmonary:     Effort: Pulmonary effort is normal.     Breath sounds: Normal breath sounds.  Abdominal:     Palpations: Abdomen is soft.     Tenderness: There is abdominal tenderness in the right upper quadrant. There is no guarding or rebound.     Comments: JP drain in place with some bilious output in the bulb, it is not draining any further fluid and I was unable to flush it despite multiple attempts  Skin:    General: Skin is warm.     Capillary Refill: Capillary refill takes 2 to 3 seconds.  Neurological:     Mental Status: He is alert and oriented to person, place, and time.  Psychiatric:        Mood and Affect: Mood normal.      Assessment/Plan Status post laparoscopic cholecystectomy 11/07/2022 Possible postoperative bile leak  CKD 3b  Admit, IV fluids, will obtain HIDA scan to evaluate for bile leak.  May need replacement of drain by interventional radiology.  I discussed the plan with him and his wife and answered his questions. Liz Malady, MD 11/15/2022, 12:19 AM

## 2022-11-15 NOTE — ED Notes (Signed)
ED TO INPATIENT HANDOFF REPORT  ED Nurse Name and Phone #: Adiel Mcnamara  S Name/Age/Gender Donald Vaughn 72 y.o. male Room/Bed: 022C/022C  Code Status   Code Status: Full Code  Home/SNF/Other Home Patient oriented to: self, place, time, and situation Is this baseline? Yes   Triage Complete: Triage complete  Chief Complaint Postoperative pain [G89.18]  Triage Note Pt reports gallbladder with surgery on the 29th. Reports since waking up this morning he has had decreased drainage from his drain on the right abdomen and increasing abdominal pain at the site that increased tonight. Denies vomiting. Last oxycodone was last night.    Allergies Allergies  Allergen Reactions   Cats Claw [Uncaria Tomentosa (Cats Claw)]     allergy   Codeine     Stomach upset   Oxycodone-Aspirin     REACTION: NAUSEA AND VOMITING   Peanut Butter Flavor     Swelling/welts   Septra [Sulfamethoxazole-Trimethoprim]     rash   Sulfonamide Derivatives     REACTION: SWELLING    Level of Care/Admitting Diagnosis ED Disposition     ED Disposition  Admit   Condition  --   Comment  Hospital Area: [facility] [[id number]]  Level of Care: Med-Surg [[id number]]  May admit patient to [facility] or [facility] if equivalent level of care is available:: No  Covid Evaluation: Asymptomatic - no recent exposure (last 10 days) testing not required  Diagnosis: Postoperative pain [310161]  Admitting Physician: Violeta Gelinas [[id number]]  Attending Physician: CCS, MD [[id number]]  Bed request comments: 6N or 5N  Certification:: I certify this patient will need inpatient services for at least 2 midnights  Estimated Length of Stay: 3          B Medical/Surgery History Past Medical History:  Diagnosis Date   Alcohol abuse    hx   Arthritis    BCC (basal cell carcinoma of skin)    Benign prostatic hypertrophy    hx   BPH (benign prostatic hyperplasia)    BPH (benign prostatic hyperplasia)    Chest  pain, atypical    Diverticulitis    Generalized headaches    GERD (gastroesophageal reflux disease)    with history of esophagitis   Hearing loss    Hypercholesterolemia    Hypertension    Leukopenia    mild   Obese    Pre-diabetes    Rectal bleeding    Skin cancer    Tinnitus    chronic   Past Surgical History:  Procedure Laterality Date   BACK SURGERY     L4/5 Dr Eligha Bridegroom   CHOLECYSTECTOMY  11/07/2022   Procedure: LAPAROSCOPIC CHOLECYSTECTOMY WITH INTRAOPERATIVE CHOLANGIOGRAM;  Surgeon: Diamantina Monks, MD;  Location: [facility] OR;  Service: General;;   COLONOSCOPY  2/03   norma. (Dr. Virginia Rochester)   EGD- normal  2/03   Dr. Virginia Rochester.   HERNIA REPAIR     umbilical with PVP   left foot surgery     R shoulder surgery  4/03   Dr. Salley Slaughter IV Location/Drains/Wounds Patient Lines/Drains/Airways Status     Active Line/Drains/Airways     Name Placement date Placement time Site Days   Peripheral IV 11/14/22 20 G Right Antecubital 11/14/22  2121  Antecubital  1   Closed System Drain 1 Abdomen Bulb (JP) 19 Fr. 11/07/22  1742  Abdomen  8   Incision - 4 Ports 11/07/22  1536  -- 8  Intake/Output Last 24 hours No intake or output data in the 24 hours ending 11/15/22 0039  Labs/Imaging Results for orders placed or performed during the hospital encounter of 11/14/22 (from the past 48 hour(s))  CBC with Differential     Status: Abnormal   Collection Time: 11/14/22  8:48 PM  Result Value Ref Range   WBC 11.1 (H) 4.0 - 10.5 K/uL   RBC 4.24 4.22 - 5.81 MIL/uL   Hemoglobin 13.1 13.0 - 17.0 g/dL   HCT 82.9 56.2 - 13.0 %   MCV 94.6 80.0 - 100.0 fL   MCH 30.9 26.0 - 34.0 pg   MCHC 32.7 30.0 - 36.0 g/dL   RDW 86.5 78.4 - 69.6 %   Platelets 254 150 - 400 K/uL   nRBC 0.0 0.0 - 0.2 %   Neutrophils Relative % 56 %   Neutro Abs 6.4 1.7 - 7.7 K/uL   Lymphocytes Relative 23 %   Lymphs Abs 2.5 0.7 - 4.0 K/uL   Monocytes Relative 11 %   Monocytes Absolute 1.2 (H) 0.1 - 1.0 K/uL    Eosinophils Relative 8 %   Eosinophils Absolute 0.9 (H) 0.0 - 0.5 K/uL   Basophils Relative 1 %   Basophils Absolute 0.1 0.0 - 0.1 K/uL   Immature Granulocytes 1 %   Abs Immature Granulocytes 0.06 0.00 - 0.07 K/uL    Comment: Performed at [facility], [address], [place], [place] [address]  Comprehensive metabolic panel     Status: Abnormal   Collection Time: 11/14/22  8:48 PM  Result Value Ref Range   Sodium 136 135 - 145 mmol/L   Potassium 3.4 (L) 3.5 - 5.1 mmol/L   Chloride 100 98 - 111 mmol/L   CO2 25 22 - 32 mmol/L   Glucose, Bld 144 (H) 70 - 99 mg/dL    Comment: Glucose reference range applies only to samples taken after fasting for at least 8 hours.   BUN 35 (H) 8 - 23 mg/dL   Creatinine, Ser 4.13 (H) 0.61 - 1.24 mg/dL   Calcium 8.8 (L) 8.9 - 10.3 mg/dL   Total Protein 7.0 6.5 - 8.1 g/dL   Albumin 3.3 (L) 3.5 - 5.0 g/dL   AST 244 (H) 15 - 41 U/L   ALT 133 (H) 0 - 44 U/L   Alkaline Phosphatase 218 (H) 38 - 126 U/L   Total Bilirubin 1.3 (H) 0.3 - 1.2 mg/dL   GFR, Estimated 32 (L) >60 mL/min    Comment: (NOTE) Calculated using the CKD-EPI Creatinine Equation (2021)    Anion gap 11 5 - 15    Comment: Performed at [facility], [address], [place], [place] [address]  Lipase, blood     Status: None   Collection Time: 11/14/22  8:48 PM  Result Value Ref Range   Lipase 34 11 - 51 U/L    Comment: Performed at [facility], [address], [place], [place] [address]  Urinalysis, Routine w reflex microscopic -Urine, Clean Catch     Status: None   Collection Time: 11/14/22 10:30 PM  Result Value Ref Range   Color, Urine YELLOW YELLOW   APPearance CLEAR CLEAR   Specific Gravity, Urine 1.006 1.005 - 1.030   pH 6.0 5.0 - 8.0   Glucose, UA NEGATIVE NEGATIVE mg/dL   Hgb urine dipstick NEGATIVE NEGATIVE   Bilirubin Urine NEGATIVE NEGATIVE   Ketones, ur NEGATIVE NEGATIVE mg/dL   Protein, ur NEGATIVE NEGATIVE  mg/dL   Nitrite NEGATIVE NEGATIVE    Leukocytes,Ua NEGATIVE NEGATIVE   RBC / HPF 0-5 0 - 5 RBC/hpf   WBC, UA 0-5 0 - 5 WBC/hpf   Bacteria, UA NONE SEEN NONE SEEN   Squamous Epithelial / HPF 0-5 0 - 5 /HPF   Hyaline Casts, UA PRESENT     Comment: Performed at [facility], [address] [place], [place] [address]   CT ABDOMEN PELVIS WO CONTRAST  Result Date: 11/14/2022 CLINICAL DATA:  Seven days postoperative from 11/07/2022 cholecystectomy, presents with postoperative right upper quadrant pain, no output from surgical drain. EXAM: CT ABDOMEN AND PELVIS WITHOUT CONTRAST TECHNIQUE: Multidetector CT imaging of the abdomen and pelvis was performed following the standard protocol without IV contrast. RADIATION DOSE REDUCTION: This exam was performed according to the departmental dose-optimization program which includes automated exposure control, adjustment of the mA and/or kV according to patient size and/or use of iterative reconstruction technique. COMPARISON:  CTs without contrast 09/22/2022 and 04/01/2022. FINDINGS: Lower chest: There is a new small layering right and trace left pleural effusions, mild adjacent atelectasis in the posterior lungs. There are scattered ground-glass disease in the lung bases but no acute pneumonic infiltrate. The cardiac size is normal. Hepatobiliary: No focal abnormality is seen in the unenhanced liver. No pneumobilia. Small rim of anterior peripancreatic fluid is noted and interval cholecystectomy with clips in the gallbladder fossa. A surgical drain enters the right abdomen from the anterolateral aspect just below the ribcage, courses cephalad than medially extending underneath the left lobe of the liver then turns laterally into the gallbladder fossa region. There is a fluid collection with small air pockets around and extending above the drain in the gallbladder fossa, measuring 6.3 x 2.6 by 2.9 cm. This could be normal postoperative fluid and residual air but could also indicate evidence of a  developing abscess or biloma. Clinical correlation is advised. There is additional trace air in the porta hepatis to the left. Pancreas: Partially atrophic and otherwise unremarkable. Spleen: Normal size and noncontrast attenuation. Adrenals/Urinary Tract: No adrenal mass. Asymmetric volume loss and small size of the right kidney is again shown. Parapelvic left renal cysts are again noted up to 3.8 cm, stable. There are a few punctate nonobstructive caliceal stones in the inferior pole of the left kidney. On the right there is a 6.5 mm stone which was previously in an interpolar infundibulum and has moved more medially into the renal pelvis just above the UPJ, but there is no hydronephrosis. This potentially could intermittently obstruct the UPJ. Again noted is a nonobstructive 2 mm caliceal stone in the right inferior pole. No renal masses seen without contrast. Both ureters are small caliber without obstructing stones. There is no bladder thickening. Stomach/Bowel: No dilatation, wall thickening or inflammatory changes of the stomach, small bowel and appendix. Mild fecal stasis ascending and transverse colon. Diverticulosis is seen greatest in the descending and sigmoid region. There is new faint inflammatory stranding alongside a short-segment of the mid sigmoid colon on 3: 76-78 and on 6: 64-69 compatible with a mild acute short segment diverticulitis. There is no abscess or free air. Vascular/Lymphatic: Aortic atherosclerosis. No enlarged abdominal or pelvic lymph nodes. Reproductive: Prostate is unremarkable. Other: In addition to a small amount of perihepatic fluid there is additional free fluid, also small amount in the distal right paracolic gutter. No free air is seen outside the in the gallbladder fossa. There is no free hemorrhage or further free fluid. There  are pelvic phleboliths. There is no incarcerated hernia. Musculoskeletal: Osteopenia and degenerative change of the spine. Postsurgical change of  umbilical hernia repair with old mesh screws. IMPRESSION: 1. Interval cholecystectomy with a surgical drain in the gallbladder fossa. There is a 6.3 x 2.6 x 2.9 cm fluid collection with small air pockets around and extending above the drain in the gallbladder fossa. This could be normal postoperative fluid and residual air but could also indicate evidence of a developing abscess or biloma. Follow-up as indicated. 2. Small rim of anterior perihepatic fluid and additional small amount of fluid in the right paracolic gutter. 3. New small right and trace left pleural effusions with adjacent atelectasis. 4. Mild acute short segment mid sigmoid diverticulitis. No abscess or free air. 5. 6.5 mm stone in the right renal pelvis just above the UPJ, without hydronephrosis. This could intermittently obstruct the UPJ but at present there is no hydronephrosis. 6. Additional nonobstructive micronephrolithiasis, asymmetric right renal atrophy and scarring. 7. Aortic atherosclerosis. 8. Constipation. 9. Osteopenia and degenerative change. Aortic Atherosclerosis (ICD10-I70.0). Electronically Signed   By: Almira BarKeith  Chesser M.D.   On: 11/14/2022 21:45    Pending Labs [facility] (From admission, onward)     Start     Ordered   Signed and Held  Comprehensive metabolic panel  Daily,   R      Signed and Held   Signed and Held  CBC  Daily,   R      Signed and Held            Vitals/Pain Today's Vitals   11/14/22 2136 11/14/22 2145 11/14/22 2230 11/14/22 2345  BP:  (!) 118/58 107/64 118/66  Pulse:  86 83 71  Resp:  18 12 13   Temp:      TempSrc:      SpO2:  100% 98% 98%  PainSc: 7        Isolation Precautions No active isolations  Medications Medications  HYDROcodone-acetaminophen (NORCO/VICODIN) 5-325 MG per tablet 1 tablet (1 tablet Oral Given 11/14/22 2054)    Mobility walks     Focused Assessments     R Recommendations: See Admitting Provider Note  Report given to:   Additional Notes:

## 2022-11-16 LAB — COMPREHENSIVE METABOLIC PANEL
ALT: 126 U/L — ABNORMAL HIGH (ref 0–44)
AST: 90 U/L — ABNORMAL HIGH (ref 15–41)
Albumin: 3.1 g/dL — ABNORMAL LOW (ref 3.5–5.0)
Alkaline Phosphatase: 242 U/L — ABNORMAL HIGH (ref 38–126)
Anion gap: 12 (ref 5–15)
BUN: 36 mg/dL — ABNORMAL HIGH (ref 8–23)
CO2: 24 mmol/L (ref 22–32)
Calcium: 8.7 mg/dL — ABNORMAL LOW (ref 8.9–10.3)
Chloride: 103 mmol/L (ref 98–111)
Creatinine, Ser: 2.35 mg/dL — ABNORMAL HIGH (ref 0.61–1.24)
GFR, Estimated: 29 mL/min — ABNORMAL LOW (ref >60)
Glucose, Bld: 112 mg/dL — ABNORMAL HIGH (ref 70–99)
Potassium: 4.3 mmol/L (ref 3.5–5.1)
Sodium: 139 mmol/L (ref 135–145)
Total Bilirubin: 0.8 mg/dL (ref 0.3–1.2)
Total Protein: 6.6 g/dL (ref 6.5–8.1)

## 2022-11-16 LAB — CBC
HCT: 39.2 % (ref 39.0–52.0)
Hemoglobin: 12.6 g/dL — ABNORMAL LOW (ref 13.0–17.0)
MCH: 31.1 pg (ref 26.0–34.0)
MCHC: 32.1 g/dL (ref 30.0–36.0)
MCV: 96.8 fL (ref 80.0–100.0)
Platelets: 204 10*3/uL (ref 150–400)
RBC: 4.05 MIL/uL — ABNORMAL LOW (ref 4.22–5.81)
RDW: 13.2 % (ref 11.5–15.5)
WBC: 8 10*3/uL (ref 4.0–10.5)
nRBC: 0 % (ref 0.0–0.2)

## 2022-11-16 MED ORDER — LOSARTAN POTASSIUM 50 MG PO TABS
50.0000 mg | ORAL_TABLET | Freq: Every day | ORAL | Status: DC
  Administered 2022-11-16 – 2022-11-21 (×6): 50 mg via ORAL
  Filled 2022-11-16 (×6): qty 1

## 2022-11-16 MED ORDER — POLYETHYLENE GLYCOL 3350 17 G PO PACK: 17.0000 g | PACK | Freq: Every day | ORAL | Status: DC | PRN

## 2022-11-16 MED ORDER — ALUM & MAG HYDROXIDE-SIMETH 200-200-20 MG/5ML PO SUSP
20.0000 mL | Freq: Four times a day (QID) | ORAL | Status: DC | PRN
  Administered 2022-11-17: 20 mL via ORAL
  Filled 2022-11-16 (×2): qty 30

## 2022-11-16 MED ORDER — LIDOCAINE 5 % EX PTCH
1.0000 | MEDICATED_PATCH | CUTANEOUS | Status: DC
  Administered 2022-11-16 – 2022-11-17 (×2): 1 via TRANSDERMAL
  Filled 2022-11-16 (×5): qty 1

## 2022-11-16 MED ORDER — HYDROCHLOROTHIAZIDE 12.5 MG PO TABS
6.2500 mg | ORAL_TABLET | Freq: Every day | ORAL | Status: DC
  Administered 2022-11-16 – 2022-11-21 (×6): 6.25 mg via ORAL
  Filled 2022-11-16 (×6): qty 1

## 2022-11-16 MED ORDER — FINASTERIDE 5 MG PO TABS
5.0000 mg | ORAL_TABLET | Freq: Every day | ORAL | Status: DC
  Administered 2022-11-16 – 2022-11-21 (×6): 5 mg via ORAL
  Filled 2022-11-16 (×6): qty 1

## 2022-11-16 MED ORDER — METHOCARBAMOL 500 MG PO TABS
1000.0000 mg | ORAL_TABLET | Freq: Three times a day (TID) | ORAL | Status: DC
Start: 2022-11-16 — End: 2022-11-21
  Administered 2022-11-16 – 2022-11-19 (×11): 1000 mg via ORAL
  Filled 2022-11-16 (×14): qty 2

## 2022-11-16 MED ORDER — LOSARTAN POTASSIUM-HCTZ 100-12.5 MG PO TABS: 0.5000 | ORAL_TABLET | Freq: Every day | ORAL | Status: DC

## 2022-11-16 MED ORDER — DOCUSATE SODIUM 100 MG PO CAPS
100.0000 mg | ORAL_CAPSULE | Freq: Two times a day (BID) | ORAL | Status: DC
  Administered 2022-11-16 – 2022-11-21 (×10): 100 mg via ORAL
  Filled 2022-11-16 (×10): qty 1

## 2022-11-16 NOTE — Plan of Care (Signed)
  Problem: Education: Goal: Knowledge of General Education information will improve Description: Including pain rating scale, medication(s)/side effects and non-pharmacologic comfort measures Outcome: Progressing   Problem: Health Behavior/Discharge Planning: Goal: Ability to manage health-related needs will improve Outcome: Progressing   Problem: Clinical Measurements: Goal: Respiratory complications will improve Outcome: Progressing Goal: Cardiovascular complication will be avoided Outcome: Progressing   Problem: Nutrition: Goal: Adequate nutrition will be maintained Outcome: Progressing   Problem: Coping: Goal: Level of anxiety will decrease Outcome: Progressing   Problem: Elimination: Goal: Will not experience complications related to bowel motility Outcome: Progressing Goal: Will not experience complications related to urinary retention Outcome: Progressing   Problem: Safety: Goal: Ability to remain free from injury will improve Outcome: Progressing

## 2022-11-16 NOTE — Progress Notes (Signed)
Progress Note     Subjective: States he had an episode of severe pain 1-2 hours after eating dinner which consisted of chicken and mac and cheese. Describes pain as severe and radiating into epigastrium and right shoulder. Pain improved after an hour with oxycodone. He has had breakfast this morning (bagel, egg, and yogurt) without any pain so far. No n/v. Has been passing flatus and had BM yesterday. He has not had any new drainage in JP today or yesterday  Objective: Vital signs in last 24 hours: Temp:  [97.9 F (36.6 C)-98.6 F (37 C)] 98.3 F (36.8 C) (04/07 0716) Pulse Rate:  [67-89] 77 (04/07 0716) Resp:  [16-18] 18 (04/07 0716) BP: (107-141)/(58-84) 141/84 (04/07 0716) SpO2:  [96 %-99 %] 99 % (04/07 0716) Last BM Date : 11/15/22  Intake/Output from previous day: 04/06 0701 - 04/07 0700 In: 646.9 [I.V.:646.9] Out: 20 [Drains:20] Intake/Output this shift: No intake/output data recorded.  PE: General: pleasant, WD, male who is laying in bed in NAD Lungs:  Respiratory effort nonlabored Abd: soft, incisions c/d/I. Drain with scant brown drainage. Bandage c/d/I without drainage. Mild TTP over incision and drain site MSK: all 4 extremities are symmetrical with no cyanosis, clubbing, or edema. Skin: warm and dry Psych: A&Ox3 with an appropriate affect.    Lab Results:  Recent Labs    11/15/22 0215 11/16/22 0331  WBC 10.0 8.0  HGB 13.0 12.6*  HCT 38.0* 39.2  PLT 197 204    BMET Recent Labs    11/15/22 0215 11/16/22 0331  NA 136 139  K 4.3 4.3  CL 98 103  CO2 26 24  GLUCOSE 132* 112*  BUN 37* 36*  CREATININE 2.32* 2.35*  CALCIUM 8.7* 8.7*    PT/INR No results for input(s): "LABPROT", "INR" in the last 72 hours. CMP     Component Value Date/Time   NA 139 11/16/2022 0331   K 4.3 11/16/2022 0331   CL 103 11/16/2022 0331   CO2 24 11/16/2022 0331   GLUCOSE 112 (H) 11/16/2022 0331   BUN 36 (H) 11/16/2022 0331   CREATININE 2.35 (H) 11/16/2022 0331    CALCIUM 8.7 (L) 11/16/2022 0331   PROT 6.6 11/16/2022 0331   ALBUMIN 3.1 (L) 11/16/2022 0331   AST 90 (H) 11/16/2022 0331   ALT 126 (H) 11/16/2022 0331   ALKPHOS 242 (H) 11/16/2022 0331   BILITOT 0.8 11/16/2022 0331   GFRNONAA 29 (L) 11/16/2022 0331   GFRAA 41 (L) 12/25/2016 0016   Lipase     Component Value Date/Time   LIPASE 34 11/14/2022 2048       Studies/Results: NM HEPATOBILIARY LEAK (POST-SURGICAL)  Result Date: 11/15/2022 CLINICAL DATA:  One-week status post laparoscopic cholecystectomy. Right upper quadrant pain. Gallbladder fossa fluid collection on recent CT. Evaluate for bile leak. EXAM: NUCLEAR MEDICINE HEPATOBILIARY IMAGING TECHNIQUE: Sequential images of the abdomen were obtained out to 60 minutes following intravenous administration of radiopharmaceutical. RADIOPHARMACEUTICALS:  5.4 mCi Tc-66m  Choletec IV COMPARISON:  None Available. FINDINGS: Prompt uptake and biliary excretion of activity by the liver is seen. No gallbladder activity is seen, consistent with cholecystectomy. Biliary activity passes into small bowel, consistent with patent common bile duct. There is no evidence of extravasation of biliary activity. IMPRESSION: Prior cholecystectomy. No evidence of biliary obstruction or postop bile leak. Electronically Signed   By: Danae Orleans M.D.   On: 11/15/2022 11:27   CT ABDOMEN PELVIS WO CONTRAST  Result Date: 11/14/2022 CLINICAL DATA:  Seven days  postoperative from 11/07/2022 cholecystectomy, presents with postoperative right upper quadrant pain, no output from surgical drain. EXAM: CT ABDOMEN AND PELVIS WITHOUT CONTRAST TECHNIQUE: Multidetector CT imaging of the abdomen and pelvis was performed following the standard protocol without IV contrast. RADIATION DOSE REDUCTION: This exam was performed according to the departmental dose-optimization program which includes automated exposure control, adjustment of the mA and/or kV according to patient size and/or use of  iterative reconstruction technique. COMPARISON:  CTs without contrast 09/22/2022 and 04/01/2022. FINDINGS: Lower chest: There is a new small layering right and trace left pleural effusions, mild adjacent atelectasis in the posterior lungs. There are scattered ground-glass disease in the lung bases but no acute pneumonic infiltrate. The cardiac size is normal. Hepatobiliary: No focal abnormality is seen in the unenhanced liver. No pneumobilia. Small rim of anterior peripancreatic fluid is noted and interval cholecystectomy with clips in the gallbladder fossa. A surgical drain enters the right abdomen from the anterolateral aspect just below the ribcage, courses cephalad than medially extending underneath the left lobe of the liver then turns laterally into the gallbladder fossa region. There is a fluid collection with small air pockets around and extending above the drain in the gallbladder fossa, measuring 6.3 x 2.6 by 2.9 cm. This could be normal postoperative fluid and residual air but could also indicate evidence of a developing abscess or biloma. Clinical correlation is advised. There is additional trace air in the porta hepatis to the left. Pancreas: Partially atrophic and otherwise unremarkable. Spleen: Normal size and noncontrast attenuation. Adrenals/Urinary Tract: No adrenal mass. Asymmetric volume loss and small size of the right kidney is again shown. Parapelvic left renal cysts are again noted up to 3.8 cm, stable. There are a few punctate nonobstructive caliceal stones in the inferior pole of the left kidney. On the right there is a 6.5 mm stone which was previously in an interpolar infundibulum and has moved more medially into the renal pelvis just above the UPJ, but there is no hydronephrosis. This potentially could intermittently obstruct the UPJ. Again noted is a nonobstructive 2 mm caliceal stone in the right inferior pole. No renal masses seen without contrast. Both ureters are small caliber  without obstructing stones. There is no bladder thickening. Stomach/Bowel: No dilatation, wall thickening or inflammatory changes of the stomach, small bowel and appendix. Mild fecal stasis ascending and transverse colon. Diverticulosis is seen greatest in the descending and sigmoid region. There is new faint inflammatory stranding alongside a short-segment of the mid sigmoid colon on 3: 76-78 and on 6: 64-69 compatible with a mild acute short segment diverticulitis. There is no abscess or free air. Vascular/Lymphatic: Aortic atherosclerosis. No enlarged abdominal or pelvic lymph nodes. Reproductive: Prostate is unremarkable. Other: In addition to a small amount of perihepatic fluid there is additional free fluid, also small amount in the distal right paracolic gutter. No free air is seen outside the in the gallbladder fossa. There is no free hemorrhage or further free fluid. There are pelvic phleboliths. There is no incarcerated hernia. Musculoskeletal: Osteopenia and degenerative change of the spine. Postsurgical change of umbilical hernia repair with old mesh screws. IMPRESSION: 1. Interval cholecystectomy with a surgical drain in the gallbladder fossa. There is a 6.3 x 2.6 x 2.9 cm fluid collection with small air pockets around and extending above the drain in the gallbladder fossa. This could be normal postoperative fluid and residual air but could also indicate evidence of a developing abscess or biloma. Follow-up as indicated. 2. Small rim  of anterior perihepatic fluid and additional small amount of fluid in the right paracolic gutter. 3. New small right and trace left pleural effusions with adjacent atelectasis. 4. Mild acute short segment mid sigmoid diverticulitis. No abscess or free air. 5. 6.5 mm stone in the right renal pelvis just above the UPJ, without hydronephrosis. This could intermittently obstruct the UPJ but at present there is no hydronephrosis. 6. Additional nonobstructive  micronephrolithiasis, asymmetric right renal atrophy and scarring. 7. Aortic atherosclerosis. 8. Constipation. 9. Osteopenia and degenerative change. Aortic Atherosclerosis (ICD10-I70.0). Electronically Signed   By: Almira BarKeith  Chesser M.D.   On: 11/14/2022 21:45    Anti-infectives: Anti-infectives (From admission, onward)    None        Assessment/Plan  Status post laparoscopic cholecystectomy 11/07/2022 fluid collection in the gallbladder fossa, measuring 6.3 x 2.6 x 2.9 cm Abdominal pain  Transaminitis   - HIDA 4/6 negative for bile leak or biliary obstruction - LFTs remain elevated but T bili now normal. WBC normal. Trend LFTs  - pain after PO last night. None so far this am. Continue regular diet for now. Added lidocaine patch and robaxin - IR consulted for drainage and feel fluid collection may have more soft tissue characteristics and do not recommend drain at this time - surgical drain without output. Remove today  FEN: regular, dec IVF to 10250ml/hr ID: none currently VTE: lovenox  CKD 3b HTN - home meds ordered BPH H/o ETOH abuse    LOS: 1 day   Eric FormMartha H Crystina Borrayo, [facility] 11/16/2022, 10:55 AM Please see Amion for pager number during day hours 7:00am-4:30pm

## 2022-11-17 LAB — CBC
HCT: 33 % — ABNORMAL LOW (ref 39.0–52.0)
Hemoglobin: 11.3 g/dL — ABNORMAL LOW (ref 13.0–17.0)
MCH: 31.9 pg (ref 26.0–34.0)
MCHC: 34.2 g/dL (ref 30.0–36.0)
MCV: 93.2 fL (ref 80.0–100.0)
Platelets: 170 10*3/uL (ref 150–400)
RBC: 3.54 MIL/uL — ABNORMAL LOW (ref 4.22–5.81)
RDW: 13.1 % (ref 11.5–15.5)
WBC: 4.6 10*3/uL (ref 4.0–10.5)
nRBC: 0 % (ref 0.0–0.2)

## 2022-11-17 LAB — COMPREHENSIVE METABOLIC PANEL
ALT: 71 U/L — ABNORMAL HIGH (ref 0–44)
AST: 30 U/L (ref 15–41)
Albumin: 2.5 g/dL — ABNORMAL LOW (ref 3.5–5.0)
Alkaline Phosphatase: 173 U/L — ABNORMAL HIGH (ref 38–126)
Anion gap: 7 (ref 5–15)
BUN: 33 mg/dL — ABNORMAL HIGH (ref 8–23)
CO2: 22 mmol/L (ref 22–32)
Calcium: 7.8 mg/dL — ABNORMAL LOW (ref 8.9–10.3)
Chloride: 108 mmol/L (ref 98–111)
Creatinine, Ser: 2.22 mg/dL — ABNORMAL HIGH (ref 0.61–1.24)
GFR, Estimated: 31 mL/min — ABNORMAL LOW (ref >60)
Glucose, Bld: 107 mg/dL — ABNORMAL HIGH (ref 70–99)
Potassium: 4.1 mmol/L (ref 3.5–5.1)
Sodium: 137 mmol/L (ref 135–145)
Total Bilirubin: 0.9 mg/dL (ref 0.3–1.2)
Total Protein: 5.2 g/dL — ABNORMAL LOW (ref 6.5–8.1)

## 2022-11-17 MED ORDER — METHOCARBAMOL 1000 MG PO TABS: 1000.0000 mg | ORAL_TABLET | Freq: Three times a day (TID) | ORAL | 0 refills | Status: AC | PRN

## 2022-11-17 MED ORDER — HYDROMORPHONE HCL 1 MG/ML IJ SOLN
1.0000 mg | Freq: Once | INTRAMUSCULAR | Status: AC
  Administered 2022-11-17: 1 mg via INTRAVENOUS
  Filled 2022-11-17: qty 1

## 2022-11-17 MED ORDER — ENOXAPARIN SODIUM 40 MG/0.4ML IJ SOSY
40.0000 mg | PREFILLED_SYRINGE | INTRAMUSCULAR | Status: DC
  Administered 2022-11-18 – 2022-11-21 (×3): 40 mg via SUBCUTANEOUS
  Filled 2022-11-17 (×3): qty 0.4

## 2022-11-17 NOTE — Plan of Care (Signed)

## 2022-11-17 NOTE — Progress Notes (Signed)
Progress Note     Subjective: Has had breakfast yesterday, supper last night and breakfast this morning without any further pain episodes. Drain out yesterday with some ongoing drainage from site. No n/v. Passing flatus and BM this am  His wife is on speaker phone  Objective: Vital signs in last 24 hours: Temp:  [97.8 F (36.6 C)-98.1 F (36.7 C)] 98.1 F (36.7 C) (04/08 0724) Pulse Rate:  [59-84] 59 (04/08 0724) Resp:  [18] 18 (04/08 0724) BP: (116-136)/(56-63) 136/63 (04/08 0724) SpO2:  [91 %-100 %] 91 % (04/08 0724) Last BM Date : 11/16/22  Intake/Output from previous day: 04/07 0701 - 04/08 0700 In: 2318.5 [P.O.:480; I.V.:1838.5] Out: -  Intake/Output this shift: No intake/output data recorded.  PE: General: pleasant, WD, male who is laying in bed in NAD Lungs:  Respiratory effort nonlabored Abd: soft, incisions c/d/I. Drain site with dried light brown drainage on dressing. Mild TTP over incision and drain site MSK: all 4 extremities are symmetrical with no cyanosis, clubbing, or edema. Skin: warm and dry Psych: A&Ox3 with an appropriate affect.    Lab Results:  Recent Labs    11/16/22 0331 11/17/22 0252  WBC 8.0 4.6  HGB 12.6* 11.3*  HCT 39.2 33.0*  PLT 204 170    BMET Recent Labs    11/16/22 0331 11/17/22 0252  NA 139 137  K 4.3 4.1  CL 103 108  CO2 24 22  GLUCOSE 112* 107*  BUN 36* 33*  CREATININE 2.35* 2.22*  CALCIUM 8.7* 7.8*    PT/INR No results for input(s): "LABPROT", "INR" in the last 72 hours. CMP     Component Value Date/Time   NA 137 11/17/2022 0252   K 4.1 11/17/2022 0252   CL 108 11/17/2022 0252   CO2 22 11/17/2022 0252   GLUCOSE 107 (H) 11/17/2022 0252   BUN 33 (H) 11/17/2022 0252   CREATININE 2.22 (H) 11/17/2022 0252   CALCIUM 7.8 (L) 11/17/2022 0252   PROT 5.2 (L) 11/17/2022 0252   ALBUMIN 2.5 (L) 11/17/2022 0252   AST 30 11/17/2022 0252   ALT 71 (H) 11/17/2022 0252   ALKPHOS 173 (H) 11/17/2022 0252   BILITOT 0.9  11/17/2022 0252   GFRNONAA 31 (L) 11/17/2022 0252   GFRAA 41 (L) 12/25/2016 0016   Lipase     Component Value Date/Time   LIPASE 34 11/14/2022 2048       Studies/Results: NM HEPATOBILIARY LEAK (POST-SURGICAL)  Result Date: 11/15/2022 CLINICAL DATA:  One-week status post laparoscopic cholecystectomy. Right upper quadrant pain. Gallbladder fossa fluid collection on recent CT. Evaluate for bile leak. EXAM: NUCLEAR MEDICINE HEPATOBILIARY IMAGING TECHNIQUE: Sequential images of the abdomen were obtained out to 60 minutes following intravenous administration of radiopharmaceutical. RADIOPHARMACEUTICALS:  5.4 mCi Tc-19m  Choletec IV COMPARISON:  None Available. FINDINGS: Prompt uptake and biliary excretion of activity by the liver is seen. No gallbladder activity is seen, consistent with cholecystectomy. Biliary activity passes into small bowel, consistent with patent common bile duct. There is no evidence of extravasation of biliary activity. IMPRESSION: Prior cholecystectomy. No evidence of biliary obstruction or postop bile leak. Electronically Signed   By: Danae Orleans M.D.   On: 11/15/2022 11:27    Anti-infectives: Anti-infectives (From admission, onward)    None        Assessment/Plan  Status post laparoscopic cholecystectomy 11/07/2022 fluid collection in the gallbladder fossa, measuring 6.3 x 2.6 x 2.9 cm Abdominal pain  Transaminitis   - HIDA 4/6 negative for bile  leak or biliary obstruction - LFTs improved. WBC normal.  - now tolerating a regular diet without pain - IR consulted for drainage and feel fluid collection may have more soft tissue characteristics and do not recommend drain at this time - surgical drain out 4/7  FEN: regular, SLIV ID: none currently VTE: lovenox  CKD 3b HTN - home meds ordered BPH H/o ETOH abuse  Dispo: dc today. Will need to clarify follow up plan with surgeon prior to dc    LOS: 2 days   Eric Form, [facility]  Surgery 11/17/2022, 9:50 AM Please see Amion for pager number during day hours 7:00am-4:30pm

## 2022-11-17 NOTE — Discharge Instructions (Signed)
[facility] LAPAROSCOPIC SURGERY: POST OP INSTRUCTIONS Always review your discharge instruction sheet given to you by the facility where your surgery was performed. IF YOU HAVE DISABILITY OR FAMILY LEAVE FORMS, YOU MUST BRING THEM TO THE OFFICE FOR PROCESSING.   DO NOT GIVE THEM TO YOUR DOCTOR.  PAIN CONTROL  First take acetaminophen (Tylenol) AND/or ibuprofen (Advil) to control your pain after surgery.  Follow directions on package.  Taking acetaminophen (Tylenol) and/or ibuprofen (Advil) regularly after surgery will help to control your pain and lower the amount of prescription pain medication you may need.  You should not take more than 3,000 mg (3 grams) of acetaminophen (Tylenol) in 24 hours.  You should not take ibuprofen (Advil), aleve, motrin, naprosyn or other NSAIDS if you have a history of stomach ulcers or chronic kidney disease.  A prescription for pain medication may be given to you upon discharge.  Take your pain medication as prescribed, if you still have uncontrolled pain after taking acetaminophen (Tylenol) or ibuprofen (Advil). Use ice packs to help control pain. If you need a refill on your pain medication, please contact your pharmacy.  They will contact our office to request authorization. Prescriptions will not be filled after 5pm or on week-ends.  HOME MEDICATIONS Take your usually prescribed medications unless otherwise directed.  DIET You should follow a light diet the first few days after arrival home.  Be sure to include lots of fluids daily. Avoid fatty, fried foods.   CONSTIPATION It is common to experience some constipation after surgery and if you are taking pain medication.  Increasing fluid intake and taking a stool softener (such as Colace) will usually help or prevent this problem from occurring.  A mild laxative (Milk of Magnesia or Miralax) should be taken according to package instructions if there are no bowel movements after 48  hours.  WOUND/INCISION CARE Most patients will experience some swelling and bruising in the area of the incisions.  Ice packs will help.  Swelling and bruising can take several days to resolve.  Unless discharge instructions indicate otherwise, follow guidelines below  STERI-STRIPS - you may remove your outer bandages 48 hours after surgery, and you may shower at that time.  You have steri-strips (small skin tapes) in place directly over the incision.  These strips should be left on the skin for 7-10 days.   DERMABOND/SKIN GLUE - you may shower in 24 hours.  The glue will flake off over the next 2-3 weeks. Any sutures or staples will be removed at the office during your follow-up visit.  ACTIVITIES You may resume regular (light) daily activities beginning the next day--such as daily self-care, walking, climbing stairs--gradually increasing activities as tolerated.  You may have sexual intercourse when it is comfortable.  Refrain from any heavy lifting or straining until approved by your doctor. You may drive when you are no longer taking prescription pain medication, you can comfortably wear a seatbelt, and you can safely maneuver your car and apply brakes.  FOLLOW-UP You should see your doctor in the office for a follow-up appointment approximately 2-3 weeks after your surgery.  You should have been given your post-op/follow-up appointment when your surgery was scheduled.  If you did not receive a post-op/follow-up appointment, make sure that you call for this appointment within a day or two after you arrive home to insure a convenient appointment time.   WHEN TO CALL YOUR DOCTOR: Fever over 101.0 Inability to urinate Continued bleeding from incision.   Increased pain, redness, or drainage from the incision. Increasing abdominal pain  The clinic staff is available to answer your questions during regular business hours.  Please don't hesitate to call and ask to speak to one of the nurses for  clinical concerns.  If you have a medical emergency, go to the nearest emergency room or call 911.  A surgeon from [facility] is always on call at the hospital. [address], [address], [place], [place]  [address] ? [address] [address], [place], [place]   [address] [phone] ? [phone] ? FAX [phone] Web site: [email or web address]  

## 2022-11-17 NOTE — Plan of Care (Signed)

## 2022-11-18 ENCOUNTER — Inpatient Hospital Stay (HOSPITAL_COMMUNITY): Payer: Medicare Other

## 2022-11-18 LAB — COMPREHENSIVE METABOLIC PANEL
ALT: 91 U/L — ABNORMAL HIGH (ref 0–44)
AST: 55 U/L — ABNORMAL HIGH (ref 15–41)
Albumin: 2.6 g/dL — ABNORMAL LOW (ref 3.5–5.0)
Alkaline Phosphatase: 228 U/L — ABNORMAL HIGH (ref 38–126)
Anion gap: 5 (ref 5–15)
BUN: 27 mg/dL — ABNORMAL HIGH (ref 8–23)
CO2: 23 mmol/L (ref 22–32)
Calcium: 8 mg/dL — ABNORMAL LOW (ref 8.9–10.3)
Chloride: 108 mmol/L (ref 98–111)
Creatinine, Ser: 2.12 mg/dL — ABNORMAL HIGH (ref 0.61–1.24)
GFR, Estimated: 32 mL/min — ABNORMAL LOW (ref >60)
Glucose, Bld: 113 mg/dL — ABNORMAL HIGH (ref 70–99)
Potassium: 4.5 mmol/L (ref 3.5–5.1)
Sodium: 136 mmol/L (ref 135–145)
Total Bilirubin: 0.9 mg/dL (ref 0.3–1.2)
Total Protein: 5.6 g/dL — ABNORMAL LOW (ref 6.5–8.1)

## 2022-11-18 LAB — CBC
HCT: 33.4 % — ABNORMAL LOW (ref 39.0–52.0)
Hemoglobin: 11.3 g/dL — ABNORMAL LOW (ref 13.0–17.0)
MCH: 32.2 pg (ref 26.0–34.0)
MCHC: 33.8 g/dL (ref 30.0–36.0)
MCV: 95.2 fL (ref 80.0–100.0)
Platelets: 181 10*3/uL (ref 150–400)
RBC: 3.51 MIL/uL — ABNORMAL LOW (ref 4.22–5.81)
RDW: 13.2 % (ref 11.5–15.5)
WBC: 7.2 10*3/uL (ref 4.0–10.5)
nRBC: 0 % (ref 0.0–0.2)

## 2022-11-18 MED ORDER — CIPROFLOXACIN IN D5W 400 MG/200ML IV SOLN: 400.0000 mg | Freq: Once | INTRAVENOUS | Status: DC

## 2022-11-18 MED ORDER — GADOBUTROL 1 MMOL/ML IV SOLN
9.0000 mL | Freq: Once | INTRAVENOUS | Status: AC | PRN
  Administered 2022-11-18: 9 mL via INTRAVENOUS

## 2022-11-18 MED ORDER — DICLOFENAC SUPPOSITORY 100 MG
100.0000 mg | Freq: Once | RECTAL | Status: DC
  Filled 2022-11-18 (×2): qty 1

## 2022-11-18 MED ORDER — DICLOFENAC SUPPOSITORY 100 MG
100.0000 mg | RECTAL | Status: AC
  Filled 2022-11-18: qty 1

## 2022-11-18 MED ORDER — CIPROFLOXACIN IN D5W 400 MG/200ML IV SOLN: 400.0000 mg | INTRAVENOUS | Status: DC

## 2022-11-18 MED ORDER — DICLOFENAC SUPPOSITORY 100 MG: 100.0000 mg | Freq: Once | RECTAL | Status: DC

## 2022-11-18 MED ORDER — CIPROFLOXACIN IN D5W 400 MG/200ML IV SOLN
400.0000 mg | INTRAVENOUS | Status: AC
  Administered 2022-11-20: 400 mg via INTRAVENOUS
  Filled 2022-11-18: qty 200

## 2022-11-18 MED ORDER — SODIUM CHLORIDE 0.9 % IV SOLN: INTRAVENOUS | Status: DC

## 2022-11-18 NOTE — Progress Notes (Signed)
Progress Note     Subjective: Had another significant pain episode around lunch time yesterday when eating a [place] sandwich. Pain eventually subsided. Otherwise no new complaints  Objective: Vital signs in last 24 hours: Temp:  [98 F (36.7 C)-98.7 F (37.1 C)] 98.7 F (37.1 C) (04/09 0748) Pulse Rate:  [62-79] 70 (04/09 0748) Resp:  [17-19] 18 (04/09 0748) BP: (102-150)/(55-72) 120/64 (04/09 0748) SpO2:  [91 %-99 %] 99 % (04/09 0748) Last BM Date : 11/17/22  Intake/Output from previous day: 04/08 0701 - 04/09 0700 In: 480 [P.O.:480] Out: -  Intake/Output this shift: Total I/O In: 240 [P.O.:240] Out: -   PE: General: pleasant, WD, male who is laying in bed in NAD Lungs:  Respiratory effort nonlabored Abd: soft, incisions c/d/I. Drain site with bandage cdi. Mild TTP over incision and drain site MSK: all 4 extremities are symmetrical with no cyanosis, clubbing, or edema. Skin: warm and dry Psych: A&Ox3 with an appropriate affect.    Lab Results:  Recent Labs    11/17/22 0252 11/18/22 0334  WBC 4.6 7.2  HGB 11.3* 11.3*  HCT 33.0* 33.4*  PLT 170 181    BMET Recent Labs    11/17/22 0252 11/18/22 0334  NA 137 136  K 4.1 4.5  CL 108 108  CO2 22 23  GLUCOSE 107* 113*  BUN 33* 27*  CREATININE 2.22* 2.12*  CALCIUM 7.8* 8.0*    PT/INR No results for input(s): "LABPROT", "INR" in the last 72 hours. CMP     Component Value Date/Time   NA 136 11/18/2022 0334   K 4.5 11/18/2022 0334   CL 108 11/18/2022 0334   CO2 23 11/18/2022 0334   GLUCOSE 113 (H) 11/18/2022 0334   BUN 27 (H) 11/18/2022 0334   CREATININE 2.12 (H) 11/18/2022 0334   CALCIUM 8.0 (L) 11/18/2022 0334   PROT 5.6 (L) 11/18/2022 0334   ALBUMIN 2.6 (L) 11/18/2022 0334   AST 55 (H) 11/18/2022 0334   ALT 91 (H) 11/18/2022 0334   ALKPHOS 228 (H) 11/18/2022 0334   BILITOT 0.9 11/18/2022 0334   GFRNONAA 32 (L) 11/18/2022 0334   GFRAA 41 (L) 12/25/2016 0016   Lipase     Component Value  Date/Time   LIPASE 34 11/14/2022 2048       Studies/Results: MR ABDOMEN MRCP W WO CONTAST  Result Date: 11/18/2022 CLINICAL DATA:  Right upper quadrant pain. Status post cholecystectomy 11/07/2022. EXAM: MRI ABDOMEN WITHOUT AND WITH CONTRAST (INCLUDING MRCP) TECHNIQUE: Multiplanar multisequence MR imaging of the abdomen was performed both before and after the administration of intravenous contrast. Heavily T2-weighted images of the biliary and pancreatic ducts were obtained, and three-dimensional MRCP images were rendered by post processing. CONTRAST:  64mL GADAVIST GADOBUTROL 1 MMOL/ML IV SOLN COMPARISON:  CT scan 11/14/2022 FINDINGS: Lower chest: Unremarkable. Hepatobiliary: No suspicious focal abnormality within the liver parenchyma. 5.1 x 3.3 x 3.3 cm heterogeneous collection is identified in the gallbladder fossa. Lesion is prominent intrinsic T1 shortening compatible with blood clot or highly proteinaceous material. After IV contrast administration, there is no rim enhancement associated with this collection. Trace intrahepatic biliary duct prominence with no dilatation of the common duct or common bile duct. While axial T2 imaging does demonstrate some central flow artifact in the distal common bile duct, there is an additional 2 mm filling defect along the dependent wall of the more distal common bile duct (see axial T2 haste image 23 of series 4, coronal haste image 19 of series  5, and axial MRCP image 10 of series 15). Pancreas: No focal mass lesion. No dilatation of the main duct. No intraparenchymal cyst. No peripancreatic edema. Spleen:  No splenomegaly. No focal mass lesion. Adrenals/Urinary Tract: No adrenal nodule or mass. Right renal atrophy evident. Central sinus cysts noted in the left kidney. Stomach/Bowel: Stomach is unremarkable. No gastric wall thickening. No evidence of outlet obstruction. Duodenum is normally positioned as is the ligament of Treitz. No small bowel or colonic  dilatation within the visualized abdomen. Vascular/Lymphatic: No abdominal aortic aneurysm. No abdominal lymphadenopathy. Other: Scattered tiny foci of fluid identified in the mesentery with trace free fluid in the pericolic gutters bilaterally. Musculoskeletal: No focal suspicious marrow enhancement within the visualized bony anatomy. IMPRESSION: 1. 5.1 x 3.3 x 3.3 cm heterogeneous collection in the gallbladder fossa with prominent intrinsic T1 shortening compatible with blood clot or highly proteinaceous material. Features likely reflect a small postoperative hematoma. No rim enhancement associated with this collection. 2. Trace intrahepatic biliary duct prominence with upper normal common bile duct diameter at 5-6 mm. Tiny 2 mm filling defect along the dependent wall of the distal common bile duct is compatible with choledocholithiasis. 3. Scattered tiny foci of fluid in the mesentery with trace free fluid in the paracolic gutters bilaterally. Electronically Signed   By: Kennith Center M.D.   On: 11/18/2022 10:45   MR 3D Recon At Scanner  Result Date: 11/18/2022 CLINICAL DATA:  Right upper quadrant pain. Status post cholecystectomy 11/07/2022. EXAM: MRI ABDOMEN WITHOUT AND WITH CONTRAST (INCLUDING MRCP) TECHNIQUE: Multiplanar multisequence MR imaging of the abdomen was performed both before and after the administration of intravenous contrast. Heavily T2-weighted images of the biliary and pancreatic ducts were obtained, and three-dimensional MRCP images were rendered by post processing. CONTRAST:  77mL GADAVIST GADOBUTROL 1 MMOL/ML IV SOLN COMPARISON:  CT scan 11/14/2022 FINDINGS: Lower chest: Unremarkable. Hepatobiliary: No suspicious focal abnormality within the liver parenchyma. 5.1 x 3.3 x 3.3 cm heterogeneous collection is identified in the gallbladder fossa. Lesion is prominent intrinsic T1 shortening compatible with blood clot or highly proteinaceous material. After IV contrast administration, there is no  rim enhancement associated with this collection. Trace intrahepatic biliary duct prominence with no dilatation of the common duct or common bile duct. While axial T2 imaging does demonstrate some central flow artifact in the distal common bile duct, there is an additional 2 mm filling defect along the dependent wall of the more distal common bile duct (see axial T2 haste image 23 of series 4, coronal haste image 19 of series 5, and axial MRCP image 10 of series 15). Pancreas: No focal mass lesion. No dilatation of the main duct. No intraparenchymal cyst. No peripancreatic edema. Spleen:  No splenomegaly. No focal mass lesion. Adrenals/Urinary Tract: No adrenal nodule or mass. Right renal atrophy evident. Central sinus cysts noted in the left kidney. Stomach/Bowel: Stomach is unremarkable. No gastric wall thickening. No evidence of outlet obstruction. Duodenum is normally positioned as is the ligament of Treitz. No small bowel or colonic dilatation within the visualized abdomen. Vascular/Lymphatic: No abdominal aortic aneurysm. No abdominal lymphadenopathy. Other: Scattered tiny foci of fluid identified in the mesentery with trace free fluid in the pericolic gutters bilaterally. Musculoskeletal: No focal suspicious marrow enhancement within the visualized bony anatomy. IMPRESSION: 1. 5.1 x 3.3 x 3.3 cm heterogeneous collection in the gallbladder fossa with prominent intrinsic T1 shortening compatible with blood clot or highly proteinaceous material. Features likely reflect a small postoperative hematoma. No rim enhancement associated  with this collection. 2. Trace intrahepatic biliary duct prominence with upper normal common bile duct diameter at 5-6 mm. Tiny 2 mm filling defect along the dependent wall of the distal common bile duct is compatible with choledocholithiasis. 3. Scattered tiny foci of fluid in the mesentery with trace free fluid in the paracolic gutters bilaterally. Electronically Signed   By: Kennith CenterEric   Mansell M.D.   On: 11/18/2022 10:45    Anti-infectives: Anti-infectives (From admission, onward)    None        Assessment/Plan  Status post laparoscopic cholecystectomy 11/07/2022 fluid collection in the gallbladder fossa, measuring 6.3 x 2.6 x 2.9 cm Abdominal pain  Transaminitis   - HIDA 4/6 negative for bile leak or biliary obstruction - IR consulted for drainage and feel fluid collection may have more soft tissue characteristics and do not recommend drain at this time - surgical drain out 4/7 - LFTs still elevated. WBC normal.  - ongoing pain and MRCP done today with findings of fluid collection likely post op hematoma and 2 mm filling defect in CBD compatible with choledocholithiasis - patient NPO since MN. - have consulted GI - Dr. Dulce Sellarutlaw  FEN: npo for GI eval, IVF @ 50 ml/hr ID: none currently VTE: lovenox  CKD 3b HTN - home meds ordered BPH H/o ETOH abuse    LOS: 3 days   Eric FormMartha H Philena Obey, [facility] 11/18/2022, 12:23 PM Please see Amion for pager number during day hours 7:00am-4:30pm

## 2022-11-18 NOTE — Care Management Important Message (Signed)
Important Message  Patient Details  Name: Donald Vaughn MRN: [id number] Date of Birth: 06-29-1951   Medicare Important Message Given:  Yes     Sherilyn Banker 11/18/2022, 1:21 PM

## 2022-11-18 NOTE — Plan of Care (Signed)

## 2022-11-18 NOTE — Consult Note (Signed)
[facility] Consultation Note  Referring Provider: [facility] Primary Care Physician:  [name], MD Primary Gastroenterologist:  Dr. Bosie Clos  Reason for Consultation:  abdominal pain, bile duct stone  HPI: Donald Vaughn is a 72 y.o. male presenting abdominal pain.  Had cholecystectomy about two weeks ago.  Post-operatively, patient has had multiple severe intermittent colicky attacks of right upper quadrant pain and feverish sensation.  Flares last about one hour and abate spontaneously.  Asymptomatic in between attacks.  No nausea, vomiting, blood in stool, hematemesis.     Past Medical History:  Diagnosis Date   Alcohol abuse    hx   Arthritis    BCC (basal cell carcinoma of skin)    Benign prostatic hypertrophy    hx   BPH (benign prostatic hyperplasia)    BPH (benign prostatic hyperplasia)    Chest pain, atypical    Diverticulitis    Generalized headaches    GERD (gastroesophageal reflux disease)    with history of esophagitis   Hearing loss    Hypercholesterolemia    Hypertension    Leukopenia    mild   Obese    Pre-diabetes    Rectal bleeding    Skin cancer    Tinnitus    chronic    Past Surgical History:  Procedure Laterality Date   BACK SURGERY     L4/5 Dr Eligha Bridegroom   CHOLECYSTECTOMY  11/07/2022   Procedure: LAPAROSCOPIC CHOLECYSTECTOMY WITH INTRAOPERATIVE CHOLANGIOGRAM;  Surgeon: Diamantina Monks, MD;  Location: [facility] OR;  Service: General;;   COLONOSCOPY  2/03   norma. (Dr. Virginia Rochester)   EGD- normal  2/03   Dr. Virginia Rochester.   HERNIA REPAIR     umbilical with PVP   left foot surgery     R shoulder surgery  4/03   Dr. Thurston Hole    Prior to Admission medications   Medication Sig Start Date End Date Taking? Authorizing Provider  finasteride (PROSCAR) 5 MG tablet Take 5 mg by mouth daily.   Yes [provider]  losartan-hydrochlorothiazide (HYZAAR) 100-12.5 MG tablet Take 0.5 tablets by mouth daily.   Yes [provider]   acetaminophen (TYLENOL) 500 MG tablet Take 2 tablets (1,000 mg total) by mouth every 6 (six) hours. 11/08/22   Diamantina Monks, MD  alum & mag hydroxide-simeth (MAALOX MAX) 400-400-40 MG/5ML suspension Take 10 mLs by mouth every 6 (six) hours as needed for indigestion. 09/22/22   Nira Conn, MD  CALCIUM PO Take 1 tablet by mouth daily.    [provider]  docusate sodium (COLACE) 100 MG capsule Take 1 capsule (100 mg total) by mouth 2 (two) times daily. 11/08/22   Diamantina Monks, MD  famotidine (PEPCID) 20 MG tablet Take 1 tablet (20 mg total) by mouth 2 (two) times daily. 12/25/16   Devoria Albe, MD  Hyaluronic Acid-Vitamin C (HYALURONIC ACID PO) Take 1 tablet by mouth daily.    [provider]  lidocaine (XYLOCAINE) 2 % solution Use as directed 10 mLs in the mouth or throat every 6 (six) hours as needed (stomach pain). 09/22/22   Nira Conn, MD  methocarbamol (ROBAXIN-750) 750 MG tablet Take 1 tablet (750 mg total) by mouth 4 (four) times daily. 11/08/22   Diamantina Monks, MD  methocarbamol 1000 MG TABS Take 1,000 mg by mouth every 8 (eight) hours as needed for up to 3 days for muscle spasms (pain). 11/17/22 11/20/22  Juliet Rude, PA-C  Multiple Vitamins-Minerals (PRESERVISION  AREDS PO) Take 1 tablet by mouth daily.    [provider]  oxyCODONE (OXY IR/ROXICODONE) 5 MG immediate release tablet Take 1 tablet (5 mg total) by mouth every 4 (four) hours as needed for severe pain. 11/08/22   Diamantina Monks, MD  predniSONE (DELTASONE) 20 MG tablet Take 3 po QD x 3d , then 2 po QD x 3d then 1 po QD x 3d 12/25/16   Devoria Albe, MD    Current Facility-Administered Medications  Medication Dose Route Frequency Provider Last Rate Last Admin   0.9 % NaCl with KCl 20 mEq/ L  infusion   Intravenous Continuous Eric Form, PA-C 50 mL/hr at 11/17/22 2036 New Bag at 11/17/22 2036   acetaminophen (TYLENOL) tablet 650 mg  650 mg Oral Q6H PRN Violeta Gelinas, MD    650 mg at 11/17/22 2032   Or   acetaminophen (TYLENOL) suppository 650 mg  650 mg Rectal Q6H PRN Violeta Gelinas, MD       alum & mag hydroxide-simeth (MAALOX/MYLANTA) 200-200-20 MG/5ML suspension 20 mL  20 mL Oral Q6H PRN Eric Form, PA-C   20 mL at 11/17/22 2032   docusate sodium (COLACE) capsule 100 mg  100 mg Oral BID Eric Form, PA-C   100 mg at 11/18/22 1048   enoxaparin (LOVENOX) injection 40 mg  40 mg Subcutaneous Q24H Pierce, Dwayne A, RPH   40 mg at 11/18/22 1048   finasteride (PROSCAR) tablet 5 mg  5 mg Oral Daily Eric Form, PA-C   5 mg at 11/18/22 1048   losartan (COZAAR) tablet 50 mg  50 mg Oral Daily Eric Form, PA-C   50 mg at 11/18/22 1048   And   hydrochlorothiazide (HYDRODIURIL) tablet 6.25 mg  6.25 mg Oral Daily Eric Form, PA-C   6.25 mg at 11/18/22 1048   lidocaine (LIDODERM) 5 % 1 patch  1 patch Transdermal Q24H Eric Form, PA-C   1 patch at 11/17/22 8119   methocarbamol (ROBAXIN) tablet 1,000 mg  1,000 mg Oral TID Carl Best H, PA-C   1,000 mg at 11/18/22 1048   morphine (PF) 4 MG/ML injection 4 mg  4 mg Intravenous Q2H PRN Violeta Gelinas, MD   4 mg at 11/17/22 1345   ondansetron (ZOFRAN-ODT) disintegrating tablet 4 mg  4 mg Oral Q6H PRN Violeta Gelinas, MD       Or   ondansetron [facility]) injection 4 mg  4 mg Intravenous Q6H PRN Violeta Gelinas, MD   4 mg at 11/18/22 1125   oxyCODONE (Oxy IR/ROXICODONE) immediate release tablet 5-10 mg  5-10 mg Oral Q4H PRN Violeta Gelinas, MD   10 mg at 11/18/22 0009   pantoprazole (PROTONIX) injection 40 mg  40 mg Intravenous QHS Violeta Gelinas, MD   40 mg at 11/17/22 2033   polyethylene glycol (MIRALAX / GLYCOLAX) packet 17 g  17 g Oral Daily PRN Carl Best H, PA-C        Allergies as of 11/14/2022 - Review Complete 11/07/2022  Allergen Reaction Noted   Cats claw [uncaria tomentosa (cats claw)]  07/20/2014   Codeine     Oxycodone-aspirin  08/31/2008   Peanut butter flavor   07/20/2014   Septra [sulfamethoxazole-trimethoprim]  07/20/2014   Sulfonamide derivatives  08/31/2008    Family History  Problem Relation Age of Onset   Lymphoma Mother    Hypertension Mother    Hyperlipidemia Mother    Diabetes Mother    Alzheimer's disease  Father     Social History   Socioeconomic History   Marital status: Married    Spouse name: Not on file   Number of children: 2   Years of education: Not on file   Highest education level: Not on file  Occupational History   Not on file  Tobacco Use   Smoking status: Never   Smokeless tobacco: Never  Substance and Sexual Activity   Alcohol use: No   Drug use: No   Sexual activity: Not on file  Other Topics Concern   Not on file  Social History Narrative   [place] [employer] and retired, now works with [employer]   Social Determinants of Corporate investment bankerHealth   Financial Resource Strain: Not on file  Food Insecurity: Patient Declined (11/15/2022)   Hunger Vital Sign    Worried About Running Out of Food in the Last Year: Patient declined    Ran Out of Food in the Last Year: Patient declined  Transportation Needs: Unknown (11/15/2022)   PRAPARE - Administrator, Civil ServiceTransportation    Lack of Transportation (Medical): Patient declined    Lack of Transportation (Non-Medical): Not on file  Physical Activity: Not on file  Stress: Not on file  Social Connections: Not on file  Intimate Partner Violence: Patient Declined (11/15/2022)   Humiliation, Afraid, Rape, and Kick questionnaire    Fear of Current or Ex-Partner: Patient declined    Emotionally Abused: Patient declined    Physically Abused: Patient declined    Sexually Abused: Patient declined    Review of Systems: As per HPI, all others negative  Physical Exam: Vital signs in last 24 hours: Temp:  [98 F (36.7 C)-98.7 F (37.1 C)] 98.7 F (37.1 C) (04/09 1317) Pulse Rate:  [62-80] 80 (04/09 1317) Resp:  [17-19] 18 (04/09 1317) BP: (102-150)/(55-72) 112/68 (04/09 1317) SpO2:  [91 %-99 %]  99 % (04/09 1317) Last BM Date : 11/17/22 General:   Alert, overweight, pleasant and cooperative in NAD Head:  Normocephalic and atraumatic. Eyes:  Sclera clear, no icterus.   Conjunctiva pink. Ears:  Normal auditory acuity. Nose:  No deformity, discharge,  or lesions. Mouth:  No deformity or lesions.  Oropharynx pink & moist. Neck:  Supple; no masses or thyromegaly. Lungs:  No respiratory distress Abdomen:  Soft, protuberant, RUQ incision dressing, mild epigastric/RUQ tenderness without peritonitis, No masses, hepatosplenomegaly or hernias noted. Normal bowel sounds, without guarding, and without rebound.     Msk:  Symmetrical without gross deformities. Normal posture. Pulses:  Normal pulses noted. Extremities:  Without clubbing or edema. Neurologic:  Alert and  oriented x4;  grossly normal neurologically. Skin:  Intact without significant lesions or rashes. Psych:  Alert and cooperative. Normal mood and affect.   Lab Results: Recent Labs    11/16/22 0331 11/17/22 0252 11/18/22 0334  WBC 8.0 4.6 7.2  HGB 12.6* 11.3* 11.3*  HCT 39.2 33.0* 33.4*  PLT 204 170 181   BMET Recent Labs    11/16/22 0331 11/17/22 0252 11/18/22 0334  NA 139 137 136  K 4.3 4.1 4.5  CL 103 108 108  CO2 24 22 23   GLUCOSE 112* 107* 113*  BUN 36* 33* 27*  CREATININE 2.35* 2.22* 2.12*  CALCIUM 8.7* 7.8* 8.0*   LFT Recent Labs    11/18/22 0334  PROT 5.6*  ALBUMIN 2.6*  AST 55*  ALT 91*  ALKPHOS 228*  BILITOT 0.9   PT/INR No results for input(s): "LABPROT", "INR" in the last 72 hours.  Studies/Results:  MR ABDOMEN MRCP W WO CONTAST  Result Date: 11/18/2022 CLINICAL DATA:  Right upper quadrant pain. Status post cholecystectomy 11/07/2022. EXAM: MRI ABDOMEN WITHOUT AND WITH CONTRAST (INCLUDING MRCP) TECHNIQUE: Multiplanar multisequence MR imaging of the abdomen was performed both before and after the administration of intravenous contrast. Heavily T2-weighted images of the biliary and  pancreatic ducts were obtained, and three-dimensional MRCP images were rendered by post processing. CONTRAST:  9mL GADAVIST GADOBUTROL 1 MMOL/ML IV SOLN COMPARISON:  CT scan 11/14/2022 FINDINGS: Lower chest: Unremarkable. Hepatobiliary: No suspicious focal abnormality within the liver parenchyma. 5.1 x 3.3 x 3.3 cm heterogeneous collection is identified in the gallbladder fossa. Lesion is prominent intrinsic T1 shortening compatible with blood clot or highly proteinaceous material. After IV contrast administration, there is no rim enhancement associated with this collection. Trace intrahepatic biliary duct prominence with no dilatation of the common duct or common bile duct. While axial T2 imaging does demonstrate some central flow artifact in the distal common bile duct, there is an additional 2 mm filling defect along the dependent wall of the more distal common bile duct (see axial T2 haste image 23 of series 4, coronal haste image 19 of series 5, and axial MRCP image 10 of series 15). Pancreas: No focal mass lesion. No dilatation of the main duct. No intraparenchymal cyst. No peripancreatic edema. Spleen:  No splenomegaly. No focal mass lesion. Adrenals/Urinary Tract: No adrenal nodule or mass. Right renal atrophy evident. Central sinus cysts noted in the left kidney. Stomach/Bowel: Stomach is unremarkable. No gastric wall thickening. No evidence of outlet obstruction. Duodenum is normally positioned as is the ligament of Treitz. No small bowel or colonic dilatation within the visualized abdomen. Vascular/Lymphatic: No abdominal aortic aneurysm. No abdominal lymphadenopathy. Other: Scattered tiny foci of fluid identified in the mesentery with trace free fluid in the pericolic gutters bilaterally. Musculoskeletal: No focal suspicious marrow enhancement within the visualized bony anatomy. IMPRESSION: 1. 5.1 x 3.3 x 3.3 cm heterogeneous collection in the gallbladder fossa with prominent intrinsic T1 shortening  compatible with blood clot or highly proteinaceous material. Features likely reflect a small postoperative hematoma. No rim enhancement associated with this collection. 2. Trace intrahepatic biliary duct prominence with upper normal common bile duct diameter at 5-6 mm. Tiny 2 mm filling defect along the dependent wall of the distal common bile duct is compatible with choledocholithiasis. 3. Scattered tiny foci of fluid in the mesentery with trace free fluid in the paracolic gutters bilaterally. Electronically Signed   By: Kennith Center M.D.   On: 11/18/2022 10:45   MR 3D Recon At Scanner  Result Date: 11/18/2022 CLINICAL DATA:  Right upper quadrant pain. Status post cholecystectomy 11/07/2022. EXAM: MRI ABDOMEN WITHOUT AND WITH CONTRAST (INCLUDING MRCP) TECHNIQUE: Multiplanar multisequence MR imaging of the abdomen was performed both before and after the administration of intravenous contrast. Heavily T2-weighted images of the biliary and pancreatic ducts were obtained, and three-dimensional MRCP images were rendered by post processing. CONTRAST:  9mL GADAVIST GADOBUTROL 1 MMOL/ML IV SOLN COMPARISON:  CT scan 11/14/2022 FINDINGS: Lower chest: Unremarkable. Hepatobiliary: No suspicious focal abnormality within the liver parenchyma. 5.1 x 3.3 x 3.3 cm heterogeneous collection is identified in the gallbladder fossa. Lesion is prominent intrinsic T1 shortening compatible with blood clot or highly proteinaceous material. After IV contrast administration, there is no rim enhancement associated with this collection. Trace intrahepatic biliary duct prominence with no dilatation of the common duct or common bile duct. While axial T2 imaging does demonstrate some central flow  artifact in the distal common bile duct, there is an additional 2 mm filling defect along the dependent wall of the more distal common bile duct (see axial T2 haste image 23 of series 4, coronal haste image 19 of series 5, and axial MRCP image 10 of  series 15). Pancreas: No focal mass lesion. No dilatation of the main duct. No intraparenchymal cyst. No peripancreatic edema. Spleen:  No splenomegaly. No focal mass lesion. Adrenals/Urinary Tract: No adrenal nodule or mass. Right renal atrophy evident. Central sinus cysts noted in the left kidney. Stomach/Bowel: Stomach is unremarkable. No gastric wall thickening. No evidence of outlet obstruction. Duodenum is normally positioned as is the ligament of Treitz. No small bowel or colonic dilatation within the visualized abdomen. Vascular/Lymphatic: No abdominal aortic aneurysm. No abdominal lymphadenopathy. Other: Scattered tiny foci of fluid identified in the mesentery with trace free fluid in the pericolic gutters bilaterally. Musculoskeletal: No focal suspicious marrow enhancement within the visualized bony anatomy. IMPRESSION: 1. 5.1 x 3.3 x 3.3 cm heterogeneous collection in the gallbladder fossa with prominent intrinsic T1 shortening compatible with blood clot or highly proteinaceous material. Features likely reflect a small postoperative hematoma. No rim enhancement associated with this collection. 2. Trace intrahepatic biliary duct prominence with upper normal common bile duct diameter at 5-6 mm. Tiny 2 mm filling defect along the dependent wall of the distal common bile duct is compatible with choledocholithiasis. 3. Scattered tiny foci of fluid in the mesentery with trace free fluid in the paracolic gutters bilaterally. Electronically Signed   By: Kennith Center M.D.   On: 11/18/2022 10:45    Impression:   Intermittent colicky RUQ pain post-cholecystectomy. Fluid/soft tissue density in gallbladder fossa.  Suspected mostly blood +/- soft tissue.  HIDA negative leak. Bile duct stone seen MRCP with elevated LFTs.  Symptoms highly compatible with ball-valving CBD stone.  Plan:   Soft low fat diet as tolerated. Plan for ERCP Thursday morning with Dr. Ewing Schlein. Risks (up to and including bleeding,  infection, perforation, pancreatitis that can be complicated by infected necrosis and death), benefits (removal of stones, alleviating blockage, decreasing risk of cholangitis or choledocholithiasis-related pancreatitis), and alternatives (watchful waiting, percutaneous transhepatic cholangiography) of ERCP were explained to patient/family in detail and patient elects to proceed.  Continue supportive care for now. [facility] will follow.   LOS: 3 days   Arla Boutwell M  11/18/2022, 2:44 PM  Cell [phone] If no answer or after 5 PM call [phone]

## 2022-11-18 NOTE — TOC Initial Note (Signed)
Transition of Care [facility]) - Initial/Assessment Note    Patient Details  Name: Donald Vaughn MRN: [id number] Date of Birth: Jun 05, 1951  [facility] CM/SW Contact:    Lawerance Sabal, RN Phone Number: 11/18/2022, 3:34 PM  Clinical Narrative:                  Patient admitted from home with spouse for abd pain after choley. Plan for ERCP Thursday.    Expected Discharge Plan: Home/Self Care Barriers to Discharge: Continued Medical Work up   Patient Goals and CMS Choice Patient states their goals for this hospitalization and ongoing recovery are:: to return home          Expected Discharge Plan and Services       Living arrangements for the past 2 months: Single Family Home Expected Discharge Date: 11/17/22                                    Prior Living Arrangements/Services Living arrangements for the past 2 months: Single Family Home                     Activities of Daily Living Home Assistive Devices/Equipment: None ADL Screening (condition at time of admission) Patient's cognitive ability adequate to safely complete daily activities?: Yes Is the patient deaf or have difficulty hearing?: No Does the patient have difficulty seeing, even when wearing glasses/contacts?: No Does the patient have difficulty concentrating, remembering, or making decisions?: No Patient able to express need for assistance with ADLs?: No Does the patient have difficulty dressing or bathing?: No Independently performs ADLs?: Yes (appropriate for developmental age) Does the patient have difficulty walking or climbing stairs?: No Weakness of Legs: None Weakness of Arms/Hands: None  Permission Sought/Granted                  Emotional Assessment              Admission diagnosis:  Postoperative pain [G89.18] Right upper quadrant abdominal pain [R10.11] Patient Active Problem List   Diagnosis Date Noted   Postoperative pain 11/15/2022    Hyperbilirubinemia 11/07/2022   PALPITATIONS 06/19/2009   CHEST PAIN-UNSPECIFIED 06/19/2009   DYSLIPIDEMIA 04/29/2009   CHEST PAIN, ATYPICAL 04/29/2009   ALCOHOL ABUSE, HX OF 04/29/2009   HYPERCHOLESTEROLEMIA 08/31/2008   BENIGN PROSTATIC HYPERTROPHY, HX OF 08/31/2008   PCP:  [name], MD Pharmacy:   [phone] - [place], [place] - [address] [place] [address] Phone: [phone] Fax: [phone]     Social Determinants of Health (SDOH) Social History: SDOH Screenings   Food Insecurity: Patient Declined (11/15/2022)  Housing: Low Risk  (11/15/2022)  Transportation Needs: Unknown (11/15/2022)  Utilities: Not At Risk (11/15/2022)  Tobacco Use: Low Risk  (11/08/2022)   SDOH Interventions:     Readmission Risk Interventions    11/08/2022    9:49 AM  Readmission Risk Prevention Plan  Post Dischage Appt Complete  Medication Screening Complete  Transportation Screening Complete

## 2022-11-19 LAB — COMPREHENSIVE METABOLIC PANEL
ALT: 97 U/L — ABNORMAL HIGH (ref 0–44)
AST: 51 U/L — ABNORMAL HIGH (ref 15–41)
Albumin: 2.7 g/dL — ABNORMAL LOW (ref 3.5–5.0)
Alkaline Phosphatase: 231 U/L — ABNORMAL HIGH (ref 38–126)
Anion gap: 7 (ref 5–15)
BUN: 20 mg/dL (ref 8–23)
CO2: 23 mmol/L (ref 22–32)
Calcium: 8.4 mg/dL — ABNORMAL LOW (ref 8.9–10.3)
Chloride: 106 mmol/L (ref 98–111)
Creatinine, Ser: 2.1 mg/dL — ABNORMAL HIGH (ref 0.61–1.24)
GFR, Estimated: 33 mL/min — ABNORMAL LOW (ref >60)
Glucose, Bld: 97 mg/dL (ref 70–99)
Potassium: 4.1 mmol/L (ref 3.5–5.1)
Sodium: 136 mmol/L (ref 135–145)
Total Bilirubin: 1.3 mg/dL — ABNORMAL HIGH (ref 0.3–1.2)
Total Protein: 6 g/dL — ABNORMAL LOW (ref 6.5–8.1)

## 2022-11-19 NOTE — Progress Notes (Signed)
Progress Note     Subjective: Low fat diet yesterday without further pain episodes. Noted some green drainage on drain bandage this am. No further complaints  Objective: Vital signs in last 24 hours: Temp:  [98.5 F (36.9 C)-98.7 F (37.1 C)] 98.5 F (36.9 C) (04/10 0900) Pulse Rate:  [71-80] 71 (04/10 0900) Resp:  [18-19] 19 (04/10 0900) BP: (112-131)/(56-68) 131/56 (04/10 0900) SpO2:  [99 %] 99 % (04/09 1317) Last BM Date : 11/17/22  Intake/Output from previous day: No intake/output data recorded. Intake/Output this shift: Total I/O In: -  Out: 1 [Urine:1]  PE: General: pleasant, WD, male who is laying in bed in NAD Lungs:  Respiratory effort nonlabored Abd: soft, incisions c/d/I. Drain site with bandage cdi with dried yellow drainage. Mild TTP over incision and drain site MSK: all 4 extremities are symmetrical with no cyanosis, clubbing, or edema. Skin: warm and dry Psych: A&Ox3 with an appropriate affect.    Lab Results:  Recent Labs    11/17/22 0252 11/18/22 0334  WBC 4.6 7.2  HGB 11.3* 11.3*  HCT 33.0* 33.4*  PLT 170 181    BMET Recent Labs    11/17/22 0252 11/18/22 0334  NA 137 136  K 4.1 4.5  CL 108 108  CO2 22 23  GLUCOSE 107* 113*  BUN 33* 27*  CREATININE 2.22* 2.12*  CALCIUM 7.8* 8.0*    PT/INR No results for input(s): "LABPROT", "INR" in the last 72 hours. CMP     Component Value Date/Time   NA 136 11/18/2022 0334   K 4.5 11/18/2022 0334   CL 108 11/18/2022 0334   CO2 23 11/18/2022 0334   GLUCOSE 113 (H) 11/18/2022 0334   BUN 27 (H) 11/18/2022 0334   CREATININE 2.12 (H) 11/18/2022 0334   CALCIUM 8.0 (L) 11/18/2022 0334   PROT 5.6 (L) 11/18/2022 0334   ALBUMIN 2.6 (L) 11/18/2022 0334   AST 55 (H) 11/18/2022 0334   ALT 91 (H) 11/18/2022 0334   ALKPHOS 228 (H) 11/18/2022 0334   BILITOT 0.9 11/18/2022 0334   GFRNONAA 32 (L) 11/18/2022 0334   GFRAA 41 (L) 12/25/2016 0016   Lipase     Component Value Date/Time   LIPASE 34  11/14/2022 2048       Studies/Results: MR ABDOMEN MRCP W WO CONTAST  Result Date: 11/18/2022 CLINICAL DATA:  Right upper quadrant pain. Status post cholecystectomy 11/07/2022. EXAM: MRI ABDOMEN WITHOUT AND WITH CONTRAST (INCLUDING MRCP) TECHNIQUE: Multiplanar multisequence MR imaging of the abdomen was performed both before and after the administration of intravenous contrast. Heavily T2-weighted images of the biliary and pancreatic ducts were obtained, and three-dimensional MRCP images were rendered by post processing. CONTRAST:  78mL GADAVIST GADOBUTROL 1 MMOL/ML IV SOLN COMPARISON:  CT scan 11/14/2022 FINDINGS: Lower chest: Unremarkable. Hepatobiliary: No suspicious focal abnormality within the liver parenchyma. 5.1 x 3.3 x 3.3 cm heterogeneous collection is identified in the gallbladder fossa. Lesion is prominent intrinsic T1 shortening compatible with blood clot or highly proteinaceous material. After IV contrast administration, there is no rim enhancement associated with this collection. Trace intrahepatic biliary duct prominence with no dilatation of the common duct or common bile duct. While axial T2 imaging does demonstrate some central flow artifact in the distal common bile duct, there is an additional 2 mm filling defect along the dependent wall of the more distal common bile duct (see axial T2 haste image 23 of series 4, coronal haste image 19 of series 5, and axial MRCP image  10 of series 15). Pancreas: No focal mass lesion. No dilatation of the main duct. No intraparenchymal cyst. No peripancreatic edema. Spleen:  No splenomegaly. No focal mass lesion. Adrenals/Urinary Tract: No adrenal nodule or mass. Right renal atrophy evident. Central sinus cysts noted in the left kidney. Stomach/Bowel: Stomach is unremarkable. No gastric wall thickening. No evidence of outlet obstruction. Duodenum is normally positioned as is the ligament of Treitz. No small bowel or colonic dilatation within the  visualized abdomen. Vascular/Lymphatic: No abdominal aortic aneurysm. No abdominal lymphadenopathy. Other: Scattered tiny foci of fluid identified in the mesentery with trace free fluid in the pericolic gutters bilaterally. Musculoskeletal: No focal suspicious marrow enhancement within the visualized bony anatomy. IMPRESSION: 1. 5.1 x 3.3 x 3.3 cm heterogeneous collection in the gallbladder fossa with prominent intrinsic T1 shortening compatible with blood clot or highly proteinaceous material. Features likely reflect a small postoperative hematoma. No rim enhancement associated with this collection. 2. Trace intrahepatic biliary duct prominence with upper normal common bile duct diameter at 5-6 mm. Tiny 2 mm filling defect along the dependent wall of the distal common bile duct is compatible with choledocholithiasis. 3. Scattered tiny foci of fluid in the mesentery with trace free fluid in the paracolic gutters bilaterally. Electronically Signed   By: Kennith Center M.D.   On: 11/18/2022 10:45   MR 3D Recon At Scanner  Result Date: 11/18/2022 CLINICAL DATA:  Right upper quadrant pain. Status post cholecystectomy 11/07/2022. EXAM: MRI ABDOMEN WITHOUT AND WITH CONTRAST (INCLUDING MRCP) TECHNIQUE: Multiplanar multisequence MR imaging of the abdomen was performed both before and after the administration of intravenous contrast. Heavily T2-weighted images of the biliary and pancreatic ducts were obtained, and three-dimensional MRCP images were rendered by post processing. CONTRAST:  29mL GADAVIST GADOBUTROL 1 MMOL/ML IV SOLN COMPARISON:  CT scan 11/14/2022 FINDINGS: Lower chest: Unremarkable. Hepatobiliary: No suspicious focal abnormality within the liver parenchyma. 5.1 x 3.3 x 3.3 cm heterogeneous collection is identified in the gallbladder fossa. Lesion is prominent intrinsic T1 shortening compatible with blood clot or highly proteinaceous material. After IV contrast administration, there is no rim enhancement  associated with this collection. Trace intrahepatic biliary duct prominence with no dilatation of the common duct or common bile duct. While axial T2 imaging does demonstrate some central flow artifact in the distal common bile duct, there is an additional 2 mm filling defect along the dependent wall of the more distal common bile duct (see axial T2 haste image 23 of series 4, coronal haste image 19 of series 5, and axial MRCP image 10 of series 15). Pancreas: No focal mass lesion. No dilatation of the main duct. No intraparenchymal cyst. No peripancreatic edema. Spleen:  No splenomegaly. No focal mass lesion. Adrenals/Urinary Tract: No adrenal nodule or mass. Right renal atrophy evident. Central sinus cysts noted in the left kidney. Stomach/Bowel: Stomach is unremarkable. No gastric wall thickening. No evidence of outlet obstruction. Duodenum is normally positioned as is the ligament of Treitz. No small bowel or colonic dilatation within the visualized abdomen. Vascular/Lymphatic: No abdominal aortic aneurysm. No abdominal lymphadenopathy. Other: Scattered tiny foci of fluid identified in the mesentery with trace free fluid in the pericolic gutters bilaterally. Musculoskeletal: No focal suspicious marrow enhancement within the visualized bony anatomy. IMPRESSION: 1. 5.1 x 3.3 x 3.3 cm heterogeneous collection in the gallbladder fossa with prominent intrinsic T1 shortening compatible with blood clot or highly proteinaceous material. Features likely reflect a small postoperative hematoma. No rim enhancement associated with this collection. 2. Trace  intrahepatic biliary duct prominence with upper normal common bile duct diameter at 5-6 mm. Tiny 2 mm filling defect along the dependent wall of the distal common bile duct is compatible with choledocholithiasis. 3. Scattered tiny foci of fluid in the mesentery with trace free fluid in the paracolic gutters bilaterally. Electronically Signed   By: Kennith CenterEric  Mansell M.D.   On:  11/18/2022 10:45    Anti-infectives: Anti-infectives (From admission, onward)    Start     Dose/Rate Route Frequency Ordered Stop   11/20/22 0600  ciprofloxacin (CIPRO) IVPB 400 mg  Status:  Discontinued        400 mg 200 mL/hr over 60 Minutes Intravenous On call to O.R. 11/18/22 1803 11/18/22 1821   11/20/22 0600  ciprofloxacin (CIPRO) IVPB 400 mg        400 mg 200 mL/hr over 60 Minutes Intravenous On call to O.R. 11/18/22 1852 11/21/22 0559   11/19/22 1900  ciprofloxacin (CIPRO) IVPB 400 mg  Status:  Discontinued        400 mg 200 mL/hr over 60 Minutes Intravenous  Once 11/18/22 1821 11/18/22 1852        Assessment/Plan  Status post laparoscopic cholecystectomy 11/07/2022 fluid collection in the gallbladder fossa, measuring 6.3 x 2.6 x 2.9 cm Abdominal pain  Transaminitis   - HIDA 4/6 negative for bile leak or biliary obstruction - IR consulted for drainage and feel fluid collection may have more soft tissue characteristics and do not recommend drain at this time - surgical drain out 4/7 - LFTs still elevated last check. WBC normal.  - ongoing pain and MRCP done 4/9 with findings of fluid collection likely post op hematoma and 2 mm filling defect in CBD compatible with choledocholithiasis - NPO MN. - GI - Dr. Dulce Sellarutlaw consulted and will plan for ERCP with Dr. Ewing SchleinMagod tomorrow  FEN: low fat, NPO MN, IVF @ 50 ml/hr ID: none currently VTE: lovenox  CKD 3b HTN - home meds ordered BPH H/o ETOH abuse    LOS: 4 days   Eric FormMartha H Lousie Calico, [facility] 11/19/2022, 12:35 PM Please see Amion for pager number during day hours 7:00am-4:30pm

## 2022-11-19 NOTE — Progress Notes (Signed)
RN changed dressing to pt LLQ of abdomen. Green drainage noted from the site, no odor.  Pt denies pain.  Pt asked RN to leave gauze for MD to see.

## 2022-11-19 NOTE — Plan of Care (Signed)
  Problem: Clinical Measurements: Goal: Will remain free from infection Outcome: Progressing   Problem: Activity: Goal: Risk for activity intolerance will decrease Outcome: Progressing   Problem: Pain Managment: Goal: General experience of comfort will improve Outcome: Progressing   Problem: Safety: Goal: Ability to remain free from injury will improve Outcome: Progressing   

## 2022-11-19 NOTE — Plan of Care (Signed)

## 2022-11-20 ENCOUNTER — Encounter (HOSPITAL_COMMUNITY): Payer: Self-pay | Admitting: General Surgery

## 2022-11-20 ENCOUNTER — Inpatient Hospital Stay (HOSPITAL_COMMUNITY): Payer: Medicare Other

## 2022-11-20 ENCOUNTER — Inpatient Hospital Stay (HOSPITAL_COMMUNITY): Payer: Medicare Other | Admitting: Certified Registered"

## 2022-11-20 ENCOUNTER — Encounter (HOSPITAL_COMMUNITY): Admission: EM | Disposition: A | Discharge status: Home / Self Care | Payer: Self-pay | Source: Home / Self Care

## 2022-11-20 DIAGNOSIS — K8050 Calculus of bile duct without cholangitis or cholecystitis without obstruction: Secondary | ICD-10-CM | POA: Diagnosis not present

## 2022-11-20 HISTORY — PX: BILIARY DILATION: SHX6850

## 2022-11-20 HISTORY — PX: REMOVAL OF STONES: SHX5545

## 2022-11-20 HISTORY — PX: ERCP: SHX5425

## 2022-11-20 HISTORY — PX: SPHINCTEROTOMY: SHX5544

## 2022-11-20 LAB — COMPREHENSIVE METABOLIC PANEL
ALT: 104 U/L — ABNORMAL HIGH (ref 0–44)
AST: 64 U/L — ABNORMAL HIGH (ref 15–41)
Albumin: 2.5 g/dL — ABNORMAL LOW (ref 3.5–5.0)
Alkaline Phosphatase: 319 U/L — ABNORMAL HIGH (ref 38–126)
Anion gap: 6 (ref 5–15)
BUN: 24 mg/dL — ABNORMAL HIGH (ref 8–23)
CO2: 23 mmol/L (ref 22–32)
Calcium: 8.2 mg/dL — ABNORMAL LOW (ref 8.9–10.3)
Chloride: 108 mmol/L (ref 98–111)
Creatinine, Ser: 1.91 mg/dL — ABNORMAL HIGH (ref 0.61–1.24)
GFR, Estimated: 37 mL/min — ABNORMAL LOW (ref >60)
Glucose, Bld: 108 mg/dL — ABNORMAL HIGH (ref 70–99)
Potassium: 4.4 mmol/L (ref 3.5–5.1)
Sodium: 137 mmol/L (ref 135–145)
Total Bilirubin: 1.4 mg/dL — ABNORMAL HIGH (ref 0.3–1.2)
Total Protein: 5.5 g/dL — ABNORMAL LOW (ref 6.5–8.1)

## 2022-11-20 SURGERY — ERCP, WITH INTERVENTION IF INDICATED
Anesthesia: General

## 2022-11-20 MED ORDER — ROCURONIUM BROMIDE 100 MG/10ML IV SOLN
INTRAVENOUS | Status: DC | PRN
  Administered 2022-11-20: 60 mg via INTRAVENOUS

## 2022-11-20 MED ORDER — AMISULPRIDE (ANTIEMETIC) 5 MG/2ML IV SOLN: 10.0000 mg | Freq: Once | INTRAVENOUS | Status: DC | PRN

## 2022-11-20 MED ORDER — INDOMETHACIN 50 MG RE SUPP
RECTAL | Status: AC
  Filled 2022-11-20: qty 2

## 2022-11-20 MED ORDER — DIPHENHYDRAMINE HCL 25 MG PO CAPS: 25.0000 mg | ORAL_CAPSULE | Freq: Four times a day (QID) | ORAL | Status: DC | PRN

## 2022-11-20 MED ORDER — DICLOFENAC SUPPOSITORY 100 MG
RECTAL | Status: AC
  Filled 2022-11-20: qty 1

## 2022-11-20 MED ORDER — PROPOFOL 500 MG/50ML IV EMUL
INTRAVENOUS | Status: DC | PRN
  Administered 2022-11-20: 125 ug/kg/min via INTRAVENOUS

## 2022-11-20 MED ORDER — DEXAMETHASONE SODIUM PHOSPHATE 10 MG/ML IJ SOLN
INTRAMUSCULAR | Status: DC | PRN
  Administered 2022-11-20: 8 mg via INTRAVENOUS

## 2022-11-20 MED ORDER — LIDOCAINE 2% (20 MG/ML) 5 ML SYRINGE
INTRAMUSCULAR | Status: DC | PRN
  Administered 2022-11-20: 100 mg via INTRAVENOUS

## 2022-11-20 MED ORDER — SODIUM CHLORIDE 0.9 % IV SOLN
INTRAVENOUS | Status: DC | PRN
  Administered 2022-11-20: 20 mL

## 2022-11-20 MED ORDER — GLYCOPYRROLATE 0.2 MG/ML IJ SOLN
INTRAMUSCULAR | Status: DC | PRN
  Administered 2022-11-20: .1 mg via INTRAVENOUS

## 2022-11-20 MED ORDER — MENTHOL 3 MG MT LOZG: 1.0000 | LOZENGE | OROMUCOSAL | Status: DC | PRN

## 2022-11-20 MED ORDER — DICLOFENAC SUPPOSITORY 100 MG
RECTAL | Status: DC | PRN
  Administered 2022-11-20: 100 mg via RECTAL

## 2022-11-20 MED ORDER — FENTANYL CITRATE (PF) 100 MCG/2ML IJ SOLN
INTRAMUSCULAR | Status: DC | PRN
  Administered 2022-11-20 (×2): 50 ug via INTRAVENOUS

## 2022-11-20 MED ORDER — PHENYLEPHRINE 80 MCG/ML (10ML) SYRINGE FOR IV PUSH (FOR BLOOD PRESSURE SUPPORT)
PREFILLED_SYRINGE | INTRAVENOUS | Status: DC | PRN
  Administered 2022-11-20 (×3): 80 ug via INTRAVENOUS
  Administered 2022-11-20: 160 ug via INTRAVENOUS

## 2022-11-20 MED ORDER — SUGAMMADEX SODIUM 200 MG/2ML IV SOLN
INTRAVENOUS | Status: DC | PRN
  Administered 2022-11-20: 200 mg via INTRAVENOUS

## 2022-11-20 MED ORDER — ONDANSETRON HCL 4 MG/2ML IJ SOLN
INTRAMUSCULAR | Status: DC | PRN
  Administered 2022-11-20: 4 mg via INTRAVENOUS

## 2022-11-20 MED ORDER — GLUCAGON HCL RDNA (DIAGNOSTIC) 1 MG IJ SOLR
INTRAMUSCULAR | Status: AC
  Filled 2022-11-20: qty 1

## 2022-11-20 MED ORDER — CIPROFLOXACIN IN D5W 400 MG/200ML IV SOLN
INTRAVENOUS | Status: AC
  Filled 2022-11-20: qty 200

## 2022-11-20 MED ORDER — PROPOFOL 10 MG/ML IV BOLUS
INTRAVENOUS | Status: DC | PRN
  Administered 2022-11-20: 160 mg via INTRAVENOUS

## 2022-11-20 MED ORDER — EPHEDRINE SULFATE-NACL 50-0.9 MG/10ML-% IV SOSY
PREFILLED_SYRINGE | INTRAVENOUS | Status: DC | PRN
  Administered 2022-11-20: 5 mg via INTRAVENOUS

## 2022-11-20 MED ORDER — MIDAZOLAM HCL 5 MG/5ML IJ SOLN
INTRAMUSCULAR | Status: DC | PRN
  Administered 2022-11-20: 1 mg via INTRAVENOUS

## 2022-11-20 MED ORDER — HYDROCORTISONE 1 % EX OINT
TOPICAL_OINTMENT | Freq: Two times a day (BID) | CUTANEOUS | Status: DC
  Filled 2022-11-20: qty 28

## 2022-11-20 MED ORDER — DIPHENHYDRAMINE HCL 50 MG/ML IJ SOLN: 12.5000 mg | Freq: Four times a day (QID) | INTRAMUSCULAR | Status: DC | PRN

## 2022-11-20 NOTE — Transfer of Care (Signed)
Immediate Anesthesia Transfer of Care Note  Patient: Donald Vaughn  Procedure(s) Performed: ENDOSCOPIC RETROGRADE CHOLANGIOPANCREATOGRAPHY (ERCP) SPHINCTEROTOMY REMOVAL OF STONES BILIARY DILATION  Patient Location: PACU  Anesthesia Type:General  Level of Consciousness: drowsy and patient cooperative  Airway & Oxygen Therapy: Patient Spontanous Breathing and Patient connected to nasal cannula oxygen  Post-op Assessment: Report given to RN and Post -op Vital signs reviewed and stable  Post vital signs: Reviewed and stable  Last Vitals:  Vitals Value Taken Time  BP 132/60 11/20/22 1105  Temp 37 C 11/20/22 1105  Pulse 77 11/20/22 1111  Resp 13 11/20/22 1111  SpO2 98 % 11/20/22 1111  Vitals shown include unvalidated device data.  Last Pain:  Vitals:   11/20/22 1105  TempSrc:   PainSc: Asleep      Patients Stated Pain Goal: 2 (11/17/22 1415)  Complications:  Encounter Notable Events  Notable Event Outcome Phase Comment  Difficult to intubate - expected  Intraprocedure Filed from anesthesia note documentation.

## 2022-11-20 NOTE — Progress Notes (Signed)
Progress Note  Day of Surgery  Subjective: Just got back from ERCP. Not having abdominal pain currently on clears. Had some milder pain yesterday. No other complaints  Objective: Vital signs in last 24 hours: Temp:  [97.6 F (36.4 C)-98.9 F (37.2 C)] 98.9 F (37.2 C) (04/11 1135) Pulse Rate:  [69-86] 69 (04/11 1135) Resp:  [13-20] 13 (04/11 1135) BP: (118-137)/(60-90) 127/63 (04/11 1135) SpO2:  [96 %-99 %] 98 % (04/11 1135) Weight:  [90.7 kg] 90.7 kg (04/11 0903) Last BM Date : 11/17/22  Intake/Output from previous day: 04/10 0701 - 04/11 0700 In: 240 [P.O.:240] Out: 1 [Urine:1] Intake/Output this shift: Total I/O In: 800 [I.V.:600; IV Piggyback:200] Out: -   PE: General: pleasant, WD, male who is laying in bed in NAD Lungs:  Respiratory effort nonlabored Abd: soft, incisions c/d/I. Drain site with bandage cdi - just changed. Mild TTP over incision and drain site MSK: all 4 extremities are symmetrical with no cyanosis, clubbing, or edema. Skin: warm and dry. Diffuse erythematous maculopapular rash localized to back Psych: A&Ox3 with an appropriate affect.    Lab Results:  Recent Labs    11/18/22 0334  WBC 7.2  HGB 11.3*  HCT 33.4*  PLT 181    BMET Recent Labs    11/19/22 1140 11/20/22 0210  NA 136 137  K 4.1 4.4  CL 106 108  CO2 23 23  GLUCOSE 97 108*  BUN 20 24*  CREATININE 2.10* 1.91*  CALCIUM 8.4* 8.2*    PT/INR No results for input(s): "LABPROT", "INR" in the last 72 hours. CMP     Component Value Date/Time   NA 137 11/20/2022 0210   K 4.4 11/20/2022 0210   CL 108 11/20/2022 0210   CO2 23 11/20/2022 0210   GLUCOSE 108 (H) 11/20/2022 0210   BUN 24 (H) 11/20/2022 0210   CREATININE 1.91 (H) 11/20/2022 0210   CALCIUM 8.2 (L) 11/20/2022 0210   PROT 5.5 (L) 11/20/2022 0210   ALBUMIN 2.5 (L) 11/20/2022 0210   AST 64 (H) 11/20/2022 0210   ALT 104 (H) 11/20/2022 0210   ALKPHOS 319 (H) 11/20/2022 0210   BILITOT 1.4 (H) 11/20/2022 0210    GFRNONAA 37 (L) 11/20/2022 0210   GFRAA 41 (L) 12/25/2016 0016   Lipase     Component Value Date/Time   LIPASE 34 11/14/2022 2048       Studies/Results: DG ERCP  Result Date: 11/20/2022 CLINICAL DATA:  Bile duct stone. EXAM: ERCP TECHNIQUE: Multiple spot images obtained with the fluoroscopic device and submitted for interpretation post-procedure. FLUOROSCOPY: Radiation Exposure Index (as provided by the fluoroscopic device): 28.191 COMPARISON:  MRCP 11/18/2022 FINDINGS: Retrograde cholangiogram demonstrates a small round filling defect in the distal common bile duct which could represent a stone. Wire was advanced into the intrahepatic bile ducts. No significant biliary dilatation. Cystic duct is patent with filling of the gallbladder. Small round filling defects in distal common bile duct on final image may represent gas bubbles. IMPRESSION: Probable small stone in the distal common bile duct. These images were submitted for radiologic interpretation only. Please see the procedural report for the amount of contrast. Electronically Signed   By: Richarda Overlie M.D.   On: 11/20/2022 11:29    Anti-infectives: Anti-infectives (From admission, onward)    Start     Dose/Rate Route Frequency Ordered Stop   11/20/22 0600  ciprofloxacin (CIPRO) IVPB 400 mg  Status:  Discontinued        400 mg 200 mL/hr  over 60 Minutes Intravenous On call to O.R. 11/18/22 1803 11/18/22 1821   11/20/22 0600  ciprofloxacin (CIPRO) IVPB 400 mg        400 mg 200 mL/hr over 60 Minutes Intravenous On call to O.R. 11/18/22 1852 11/20/22 1047   11/19/22 1900  ciprofloxacin (CIPRO) IVPB 400 mg  Status:  Discontinued        400 mg 200 mL/hr over 60 Minutes Intravenous  Once 11/18/22 1821 11/18/22 1852        Assessment/Plan  Status post laparoscopic cholecystectomy 11/07/2022 fluid collection in the gallbladder fossa, measuring 6.3 x 2.6 x 2.9 cm Abdominal pain  Transaminitis   - HIDA 4/6 negative for bile leak or  biliary obstruction - IR consulted for drainage and feel fluid collection may have more soft tissue characteristics and do not recommend drain at this time - surgical drain out 4/7 - some intermittent bilious appearing fluid from site - LFTs still elevated this am pre ERCP. Recheck am per GI - ongoing pain and MRCP done 4/9 with findings of fluid collection likely post op hematoma and 2 mm filling defect in CBD compatible with choledocholithiasis - ERCP with Dr. Ewing Schlein 4/11  Rash/contact dermatitis - localized to back. Mildly pruritic. No new meds at time of appearance and suspect contact derm with sheets. Recc he wear a tshirt for barrier. Moisturizer. Benadryl prn  Dispo: likely dc in next 24 hours  FEN: CLD for 6 hours post ERCP then soft diet, IVF @ 50 ml/hr ID: none currently VTE: lovenox  CKD 3b HTN - home meds ordered BPH H/o ETOH abuse    LOS: 5 days   Eric Form, [facility] 11/20/2022, 1:56 PM Please see Amion for pager number during day hours 7:00am-4:30pm

## 2022-11-20 NOTE — Anesthesia Postprocedure Evaluation (Signed)
Anesthesia Post Note  Patient: Donald Vaughn  Procedure(s) Performed: ENDOSCOPIC RETROGRADE CHOLANGIOPANCREATOGRAPHY (ERCP) SPHINCTEROTOMY REMOVAL OF STONES BILIARY DILATION     Patient location during evaluation: PACU Anesthesia Type: General Level of consciousness: awake and alert Pain management: pain level controlled Vital Signs Assessment: post-procedure vital signs reviewed and stable Respiratory status: spontaneous breathing, nonlabored ventilation and respiratory function stable Cardiovascular status: blood pressure returned to baseline Postop Assessment: no apparent nausea or vomiting Anesthetic complications: yes   Encounter Notable Events  Notable Event Outcome Phase Comment  Difficult to intubate - expected  Intraprocedure Filed from anesthesia note documentation.    Last Vitals:  Vitals:   11/20/22 1120 11/20/22 1135  BP: 131/61 127/63  Pulse: 75 69  Resp: 13 13  Temp:  37.2 C  SpO2: 99% 98%    Last Pain:  Vitals:   11/20/22 1135  TempSrc:   PainSc: 0-No pain                 Shanda Howells

## 2022-11-20 NOTE — Plan of Care (Signed)
  Problem: Education: Goal: Knowledge of General Education information will improve Description Including pain rating scale, medication(s)/side effects and non-pharmacologic comfort measures Outcome: Progressing   

## 2022-11-20 NOTE — Anesthesia Preprocedure Evaluation (Addendum)
Anesthesia Evaluation  Patient identified by MRN, date of birth, ID band Patient awake    Reviewed: Allergy & Precautions, NPO status , Patient's Chart, lab work & pertinent test results  History of Anesthesia Complications Negative for: history of anesthetic complications  Airway Mallampati: II  TM Distance: >3 FB Neck ROM: Limited    Dental no notable dental hx.    Pulmonary neg pulmonary ROS   Pulmonary exam normal        Cardiovascular hypertension, Normal cardiovascular exam     Neuro/Psych  Headaches    GI/Hepatic Neg liver ROS,GERD  ,,bile duct stone   Endo/Other  negative endocrine ROS    Renal/GU Renal InsufficiencyRenal disease (Cr 1.91)  negative genitourinary   Musculoskeletal  (+) Arthritis ,    Abdominal   Peds  Hematology  (+) Blood dyscrasia (Hgb 11.3), anemia   Anesthesia Other Findings Day of surgery medications reviewed with patient.  Reproductive/Obstetrics negative OB ROS                             Anesthesia Physical Anesthesia Plan  ASA: 2  Anesthesia Plan: General   Post-op Pain Management: Minimal or no pain anticipated   Induction: Intravenous  PONV Risk Score and Plan: 2 and Treatment may vary due to age or medical condition, Ondansetron and Dexamethasone  Airway Management Planned: Oral ETT  Additional Equipment: None  Intra-op Plan:   Post-operative Plan: Extubation in OR  Informed Consent: I have reviewed the patients History and Physical, chart, labs and discussed the procedure including the risks, benefits and alternatives for the proposed anesthesia with the patient or authorized representative who has indicated his/her understanding and acceptance.     Dental advisory given  Plan Discussed with: CRNA  Anesthesia Plan Comments:        Anesthesia Quick Evaluation

## 2022-11-20 NOTE — Op Note (Signed)
[facility] Patient Name: Donald DutchWayne Raigoza Procedure Date : 11/20/2022 MRN: [id number] Attending MD: Vida RiggerMarc Lekita Kerekes , MD, [phone] Date of Birth: [birth year] CSN: [id number] Age: 72 Admit Type: Inpatient Procedure:                ERCP Indications:              Evaluation and possible treatment of bile duct                            stone(s) elevated liver tests abdominal pain stone                            on MRCP Providers:                Vida RiggerMarc Lashann Hagg, MD, Fransisca ConnorsMichael Williams, Leanne LovelyAllison Townsend,                            Technician Referring MD:              Medicines:                General Anesthesia Complications:            No immediate complications. Estimated Blood Loss:     Estimated blood loss: none. Procedure:                Pre-Anesthesia Assessment:                           - Prior to the procedure, a History and Physical                            was performed, and patient medications and                            allergies were reviewed. The patient's tolerance of                            previous anesthesia was also reviewed. The risks                            and benefits of the procedure and the sedation                            options and risks were discussed with the patient.                            All questions were answered, and informed consent                            was obtained. Prior Anticoagulants: The patient has                            taken no anticoagulant or antiplatelet agents. ASA                            Grade Assessment: II -  A patient with mild systemic                            disease. After reviewing the risks and benefits,                            the patient was deemed in satisfactory condition to                            undergo the procedure.                           After obtaining informed consent, the scope was                            passed under direct vision. Throughout the                             procedure, the patient's blood pressure, pulse, and                            oxygen saturations were monitored continuously. The                            [id number]-Q190V ([id number] Olympus duodenoscope was                            introduced through the mouth, and used to inject                            contrast into and used to cannulate the bile duct.                            The ERCP was accomplished without difficulty. The                            patient tolerated the procedure well. Scope In: Scope Out: Findings:      The major papilla was normal. Deep selective cannulation was readily       obtained and the wire was advanced into the intrahepatics and an obvious       stone was seen on initial cholangiogram and we proceeded with a biliary       sphincterotomy which was made with a Hydratome sphincterotome using ERBE       electrocautery. There was no post-sphincterotomy bleeding. To discover       objects, the biliary tree was swept with an adjustable 9- 12 mm balloon       starting at the bifurcation. Sludge was swept from the duct. All stones       were removed. Nothing was found on subsequent occlusion cholangiogram.       And at this point we can only get the 9 mm balloon through the distal       duct with minimal resistance and there was no biliary drainage or       contrast drainage so we proceeded with dilation of the common bile  duct       with a 4 cm by 8 mm balloon dilator which was successful. To discover       objects, the biliary tree was swept 1 more time with a 12 mm balloon       starting at the bifurcation. Nothing was found and the balloon passed       readily through the patent sphincterotomy and dilation site and there       was adequate biliary drainage and the wire and the balloon were removed       and the scope was removed and the patient tolerated the procedure well       there was no obvious immediate complication and not mentioned above        there was no pancreatic duct injection or wire advancement and the       cystic duct remnant was filled without any obvious leak. Impression:               - The major papilla appeared normal.                           - Choledocholithiasis was found. Complete removal                            was accomplished by biliary sphincterotomy and                            balloon extraction.                           - A biliary sphincterotomy was performed.                           - The biliary tree was swept and nothing was found                            at the end of the procedure.                           - Common bile duct was successfully dilated.                           - The biliary tree was swept and nothing was found                            after the dilation as well. Recommendation:           - Clear liquid diet for 6 hours. If doing well this                            evening may have soft solids                           - Continue present medications.                           - Return to GI clinic PRN.                           -  Telephone GI clinic if symptomatic PRN. Partner                            Dr. Dulce Sellar will round on tomorrow                           - Check liver enzymes (AST, ALT, alkaline                            phosphatase, bilirubin) in the morning. And will                            set up repeat liver test next week in our office                            and follow back to normal as an outpatient Procedure Code(s):        --- Professional ---                           [phone], 59, Endoscopic retrograde                            cholangiopancreatography (ERCP); with                            trans-endoscopic balloon dilation of                            biliary/pancreatic duct(s) or of ampulla                            (sphincteroplasty), including sphincterotomy, when                            performed, each duct                            [id number], Endoscopic retrograde                            cholangiopancreatography (ERCP); with removal of                            calculi/debris from biliary/pancreatic duct(s) Diagnosis Code(s):        --- Professional ---                           K80.50, Calculus of bile duct without cholangitis                            or cholecystitis without obstruction CPT copyright 2022 [employer]. All rights reserved. The codes documented in this report are preliminary and upon coder review may  be revised to meet current compliance requirements. Vida Rigger, MD 11/20/2022 11:05:56 AM This report has been signed electronically. Number of Addenda: 0

## 2022-11-20 NOTE — Progress Notes (Signed)
Donald Vaughn 9:59 AM  Subjective: Patient is currently doing okay and we discussed he has biliary colic his case discussed with my partner Dr. Dulce Sellar and his hospital computer chart reviewed answered all of his questions and discussed the procedure  Objective: Vital signs stable afebrile no acute distress exam please see preassessment evaluation LFTs about the same CBC okay MRCP reviewed  Assessment: CBD stone  Plan: Risk benefits methods and success rate of ERCP was discussed with the patient will proceed today with anesthesia assistance  [facility]  office [phone] After 5PM or if no answer call [phone]

## 2022-11-20 NOTE — Anesthesia Procedure Notes (Signed)
Procedure Name: Intubation Date/Time: 11/20/2022 9:59 AM  Performed by: Marny Lowenstein, CRNAPre-anesthesia Checklist: Patient identified, Emergency Drugs available, Suction available and Patient being monitored Patient Re-evaluated:Patient Re-evaluated prior to induction Oxygen Delivery Method: Circle system utilized Preoxygenation: Pre-oxygenation with 100% oxygen Induction Type: IV induction Ventilation: Mask ventilation without difficulty Laryngoscope Size: Glidescope and 3 Grade View: Grade I Tube type: Oral Tube size: 7.5 mm Number of attempts: 2 Airway Equipment and Method: Patient positioned with wedge pillow and Rigid stylet Placement Confirmation: ETT inserted through vocal cords under direct vision, positive ETCO2 and breath sounds checked- equal and bilateral Secured at: 23 cm Tube secured with: Tape Dental Injury: Teeth and Oropharynx as per pre-operative assessment  Difficulty Due To: Difficulty was anticipated, Difficult Airway- due to limited oral opening and Difficult Airway- due to anterior larynx Comments: Easy mask. DL #1 with miller 2; no view. DL #2 with glidescope. Easily passed ETT.

## 2022-11-21 LAB — COMPREHENSIVE METABOLIC PANEL
ALT: 91 U/L — ABNORMAL HIGH (ref 0–44)
AST: 44 U/L — ABNORMAL HIGH (ref 15–41)
Albumin: 2.6 g/dL — ABNORMAL LOW (ref 3.5–5.0)
Alkaline Phosphatase: 306 U/L — ABNORMAL HIGH (ref 38–126)
Anion gap: 6 (ref 5–15)
BUN: 31 mg/dL — ABNORMAL HIGH (ref 8–23)
CO2: 22 mmol/L (ref 22–32)
Calcium: 8.2 mg/dL — ABNORMAL LOW (ref 8.9–10.3)
Chloride: 107 mmol/L (ref 98–111)
Creatinine, Ser: 2.36 mg/dL — ABNORMAL HIGH (ref 0.61–1.24)
GFR, Estimated: 29 mL/min — ABNORMAL LOW (ref >60)
Glucose, Bld: 140 mg/dL — ABNORMAL HIGH (ref 70–99)
Potassium: 5.3 mmol/L — ABNORMAL HIGH (ref 3.5–5.1)
Sodium: 135 mmol/L (ref 135–145)
Total Bilirubin: 0.7 mg/dL (ref 0.3–1.2)
Total Protein: 5.7 g/dL — ABNORMAL LOW (ref 6.5–8.1)

## 2022-11-21 LAB — CBC WITH DIFFERENTIAL/PLATELET
Abs Immature Granulocytes: 0.03 10*3/uL (ref 0.00–0.07)
Basophils Absolute: 0 10*3/uL (ref 0.0–0.1)
Basophils Relative: 0 %
Eosinophils Absolute: 0.4 10*3/uL (ref 0.0–0.5)
Eosinophils Relative: 4 %
HCT: 31.4 % — ABNORMAL LOW (ref 39.0–52.0)
Hemoglobin: 10.6 g/dL — ABNORMAL LOW (ref 13.0–17.0)
Immature Granulocytes: 0 %
Lymphocytes Relative: 9 %
Lymphs Abs: 0.9 10*3/uL (ref 0.7–4.0)
MCH: 31.7 pg (ref 26.0–34.0)
MCHC: 33.8 g/dL (ref 30.0–36.0)
MCV: 94 fL (ref 80.0–100.0)
Monocytes Absolute: 0.5 10*3/uL (ref 0.1–1.0)
Monocytes Relative: 5 %
Neutro Abs: 7.7 10*3/uL (ref 1.7–7.7)
Neutrophils Relative %: 82 %
Platelets: 225 10*3/uL (ref 150–400)
RBC: 3.34 MIL/uL — ABNORMAL LOW (ref 4.22–5.81)
RDW: 12.8 % (ref 11.5–15.5)
WBC: 9.5 10*3/uL (ref 4.0–10.5)
nRBC: 0 % (ref 0.0–0.2)

## 2022-11-21 MED ORDER — PANTOPRAZOLE SODIUM 40 MG PO TBEC: 40.0000 mg | DELAYED_RELEASE_TABLET | Freq: Every day | ORAL | Status: DC

## 2022-11-21 MED ORDER — SODIUM CHLORIDE 0.9 % IV SOLN: INTRAVENOUS | Status: DC

## 2022-11-21 NOTE — Progress Notes (Signed)
Physician Discharge Summary  Donald Vaughn BMS:[id number] DOB: [birth year] DOA: 11/14/2022  PCP: [name], MD  Admit date: 11/14/2022 Discharge date:  11/21/2022   Recommendations for Outpatient Follow-up:   (include homehealth, outpatient follow-up instructions, specific recommendations for PCP to follow-up on, etc.)   Follow-up Information     Maczis, Hedda Slade, PA-C. Go on 12/03/2022.   Specialty: General Surgery Why: follow up on 4/24 at 1:30 pm. Please arrive 15 minutes early to complete check in, and bring photo ID and insurance card. Contact information: [address] [facility] [place] [address] [phone]                Discharge Diagnoses:  Principal Problem:   Postoperative pain   Surgical Procedure: ERCP 4/11  Discharge Condition: Good Disposition: Home  Diet recommendation: low fat diet   Hospital Course:  72 yo male admitted 1 week after lap chole with concern for bile leak. Drain came out 4/7 because of clog. He was found to have choledocholithiasis. He underwent ERCP and symptoms resolved. He was discharged home 4/12.  Discharge Instructions  Discharge Instructions     Diet - low sodium heart healthy   Complete by: As directed    Increase activity slowly   Complete by: As directed       Allergies as of 11/21/2022       Reactions   Cats Claw [uncaria Tomentosa (cats Claw)]    allergy   Codeine    Stomach upset   Oxycodone-aspirin    REACTION: NAUSEA AND VOMITING   Peanut Butter Flavor    Swelling/welts   Septra [sulfamethoxazole-trimethoprim]    rash   Sulfonamide Derivatives    REACTION: SWELLING        Medication List     STOP taking these medications    methocarbamol 750 MG tablet Commonly known as: Robaxin-750       TAKE these medications    acetaminophen 500 MG tablet Commonly known as: TYLENOL Take 2 tablets (1,000 mg total) by mouth every 6 (six) hours.   CALCIUM  PO Take 1 tablet by mouth daily.   docusate sodium 100 MG capsule Commonly known as: COLACE Take 1 capsule (100 mg total) by mouth 2 (two) times daily.   famotidine 20 MG tablet Commonly known as: PEPCID Take 1 tablet (20 mg total) by mouth 2 (two) times daily.   finasteride 5 MG tablet Commonly known as: PROSCAR Take 5 mg by mouth daily.   HYALURONIC ACID PO Take 1 tablet by mouth daily.   lidocaine 2 % solution Commonly known as: XYLOCAINE Use as directed 10 mLs in the mouth or throat every 6 (six) hours as needed (stomach pain).   losartan-hydrochlorothiazide 100-12.5 MG tablet Commonly known as: HYZAAR Take 0.5 tablets by mouth daily.   Maalox Max 400-400-40 MG/5ML suspension Generic drug: alum & mag hydroxide-simeth Take 10 mLs by mouth every 6 (six) hours as needed for indigestion.   oxyCODONE 5 MG immediate release tablet Commonly known as: Oxy IR/ROXICODONE Take 1 tablet (5 mg total) by mouth every 4 (four) hours as needed for severe pain.   predniSONE 20 MG tablet Commonly known as: DELTASONE Take 3 po QD x 3d , then 2 po QD x 3d then 1 po QD x 3d   PRESERVISION AREDS PO Take 1 tablet by mouth daily.       ASK your doctor about these medications    Methocarbamol 1000 MG Tabs Take  1,000 mg by mouth every 8 (eight) hours as needed for up to 3 days for muscle spasms (pain). Ask about: Should I take this medication?        Follow-up Information     Maczis, Hedda Slade, [place]. Go on 12/03/2022.   Specialty: General Surgery Why: follow up on 4/24 at 1:30 pm. Please arrive 15 minutes early to complete check in, and bring photo ID and insurance card. Contact information: [address] [facility] [place] [address] [phone]                  The results of significant diagnostics from this hospitalization (including imaging, microbiology, ancillary and laboratory) are listed below for reference.     Significant Diagnostic Studies: DG ERCP  Result Date: 11/20/2022 CLINICAL DATA:  Bile duct stone. EXAM: ERCP TECHNIQUE: Multiple spot images obtained with the fluoroscopic device and submitted for interpretation post-procedure. FLUOROSCOPY: Radiation Exposure Index (as provided by the fluoroscopic device): 28.191 COMPARISON:  MRCP 11/18/2022 FINDINGS: Retrograde cholangiogram demonstrates a small round filling defect in the distal common bile duct which could represent a stone. Wire was advanced into the intrahepatic bile ducts. No significant biliary dilatation. Cystic duct is patent with filling of the gallbladder. Small round filling defects in distal common bile duct on final image may represent gas bubbles. IMPRESSION: Probable small stone in the distal common bile duct. These images were submitted for radiologic interpretation only. Please see the procedural report for the amount of contrast. Electronically Signed   By: Richarda Overlie M.D.   On: 11/20/2022 11:29   MR ABDOMEN MRCP W WO CONTAST  Result Date: 11/18/2022 CLINICAL DATA:  Right upper quadrant pain. Status post cholecystectomy 11/07/2022. EXAM: MRI ABDOMEN WITHOUT AND WITH CONTRAST (INCLUDING MRCP) TECHNIQUE: Multiplanar multisequence MR imaging of the abdomen was performed both before and after the administration of intravenous contrast. Heavily T2-weighted images of the biliary and pancreatic ducts were obtained, and three-dimensional MRCP images were rendered by post processing. CONTRAST:  9mL GADAVIST GADOBUTROL 1 MMOL/ML IV SOLN COMPARISON:  CT scan 11/14/2022 FINDINGS: Lower chest: Unremarkable. Hepatobiliary: No suspicious focal abnormality within the liver parenchyma. 5.1 x 3.3 x 3.3 cm heterogeneous collection is identified in the gallbladder fossa. Lesion is prominent intrinsic T1 shortening compatible with blood clot or highly proteinaceous material. After IV contrast administration, there is no rim enhancement associated with this  collection. Trace intrahepatic biliary duct prominence with no dilatation of the common duct or common bile duct. While axial T2 imaging does demonstrate some central flow artifact in the distal common bile duct, there is an additional 2 mm filling defect along the dependent wall of the more distal common bile duct (see axial T2 haste image 23 of series 4, coronal haste image 19 of series 5, and axial MRCP image 10 of series 15). Pancreas: No focal mass lesion. No dilatation of the main duct. No intraparenchymal cyst. No peripancreatic edema. Spleen:  No splenomegaly. No focal mass lesion. Adrenals/Urinary Tract: No adrenal nodule or mass. Right renal atrophy evident. Central sinus cysts noted in the left kidney. Stomach/Bowel: Stomach is unremarkable. No gastric wall thickening. No evidence of outlet obstruction. Duodenum is normally positioned as is the ligament of Treitz. No small bowel or colonic dilatation within the visualized abdomen. Vascular/Lymphatic: No abdominal aortic aneurysm. No abdominal lymphadenopathy. Other: Scattered tiny foci of fluid identified in the mesentery with trace free fluid in the pericolic gutters bilaterally. Musculoskeletal: No focal suspicious marrow enhancement  within the visualized bony anatomy. IMPRESSION: 1. 5.1 x 3.3 x 3.3 cm heterogeneous collection in the gallbladder fossa with prominent intrinsic T1 shortening compatible with blood clot or highly proteinaceous material. Features likely reflect a small postoperative hematoma. No rim enhancement associated with this collection. 2. Trace intrahepatic biliary duct prominence with upper normal common bile duct diameter at 5-6 mm. Tiny 2 mm filling defect along the dependent wall of the distal common bile duct is compatible with choledocholithiasis. 3. Scattered tiny foci of fluid in the mesentery with trace free fluid in the paracolic gutters bilaterally. Electronically Signed   By: Kennith Center M.D.   On: 11/18/2022 10:45    MR 3D Recon At Scanner  Result Date: 11/18/2022 CLINICAL DATA:  Right upper quadrant pain. Status post cholecystectomy 11/07/2022. EXAM: MRI ABDOMEN WITHOUT AND WITH CONTRAST (INCLUDING MRCP) TECHNIQUE: Multiplanar multisequence MR imaging of the abdomen was performed both before and after the administration of intravenous contrast. Heavily T2-weighted images of the biliary and pancreatic ducts were obtained, and three-dimensional MRCP images were rendered by post processing. CONTRAST:  9mL GADAVIST GADOBUTROL 1 MMOL/ML IV SOLN COMPARISON:  CT scan 11/14/2022 FINDINGS: Lower chest: Unremarkable. Hepatobiliary: No suspicious focal abnormality within the liver parenchyma. 5.1 x 3.3 x 3.3 cm heterogeneous collection is identified in the gallbladder fossa. Lesion is prominent intrinsic T1 shortening compatible with blood clot or highly proteinaceous material. After IV contrast administration, there is no rim enhancement associated with this collection. Trace intrahepatic biliary duct prominence with no dilatation of the common duct or common bile duct. While axial T2 imaging does demonstrate some central flow artifact in the distal common bile duct, there is an additional 2 mm filling defect along the dependent wall of the more distal common bile duct (see axial T2 haste image 23 of series 4, coronal haste image 19 of series 5, and axial MRCP image 10 of series 15). Pancreas: No focal mass lesion. No dilatation of the main duct. No intraparenchymal cyst. No peripancreatic edema. Spleen:  No splenomegaly. No focal mass lesion. Adrenals/Urinary Tract: No adrenal nodule or mass. Right renal atrophy evident. Central sinus cysts noted in the left kidney. Stomach/Bowel: Stomach is unremarkable. No gastric wall thickening. No evidence of outlet obstruction. Duodenum is normally positioned as is the ligament of Treitz. No small bowel or colonic dilatation within the visualized abdomen. Vascular/Lymphatic: No abdominal  aortic aneurysm. No abdominal lymphadenopathy. Other: Scattered tiny foci of fluid identified in the mesentery with trace free fluid in the pericolic gutters bilaterally. Musculoskeletal: No focal suspicious marrow enhancement within the visualized bony anatomy. IMPRESSION: 1. 5.1 x 3.3 x 3.3 cm heterogeneous collection in the gallbladder fossa with prominent intrinsic T1 shortening compatible with blood clot or highly proteinaceous material. Features likely reflect a small postoperative hematoma. No rim enhancement associated with this collection. 2. Trace intrahepatic biliary duct prominence with upper normal common bile duct diameter at 5-6 mm. Tiny 2 mm filling defect along the dependent wall of the distal common bile duct is compatible with choledocholithiasis. 3. Scattered tiny foci of fluid in the mesentery with trace free fluid in the paracolic gutters bilaterally. Electronically Signed   By: Kennith Center M.D.   On: 11/18/2022 10:45   NM HEPATOBILIARY LEAK (POST-SURGICAL)  Result Date: 11/15/2022 CLINICAL DATA:  One-week status post laparoscopic cholecystectomy. Right upper quadrant pain. Gallbladder fossa fluid collection on recent CT. Evaluate for bile leak. EXAM: NUCLEAR MEDICINE HEPATOBILIARY IMAGING TECHNIQUE: Sequential images of the abdomen were obtained out to 60  minutes following intravenous administration of radiopharmaceutical. RADIOPHARMACEUTICALS:  5.4 mCi Tc-50m  Choletec IV COMPARISON:  None Available. FINDINGS: Prompt uptake and biliary excretion of activity by the liver is seen. No gallbladder activity is seen, consistent with cholecystectomy. Biliary activity passes into small bowel, consistent with patent common bile duct. There is no evidence of extravasation of biliary activity. IMPRESSION: Prior cholecystectomy. No evidence of biliary obstruction or postop bile leak. Electronically Signed   By: Danae Orleans M.D.   On: 11/15/2022 11:27   CT ABDOMEN PELVIS WO CONTRAST  Result  Date: 11/14/2022 CLINICAL DATA:  Seven days postoperative from 11/07/2022 cholecystectomy, presents with postoperative right upper quadrant pain, no output from surgical drain. EXAM: CT ABDOMEN AND PELVIS WITHOUT CONTRAST TECHNIQUE: Multidetector CT imaging of the abdomen and pelvis was performed following the standard protocol without IV contrast. RADIATION DOSE REDUCTION: This exam was performed according to the departmental dose-optimization program which includes automated exposure control, adjustment of the mA and/or kV according to patient size and/or use of iterative reconstruction technique. COMPARISON:  CTs without contrast 09/22/2022 and 04/01/2022. FINDINGS: Lower chest: There is a new small layering right and trace left pleural effusions, mild adjacent atelectasis in the posterior lungs. There are scattered ground-glass disease in the lung bases but no acute pneumonic infiltrate. The cardiac size is normal. Hepatobiliary: No focal abnormality is seen in the unenhanced liver. No pneumobilia. Small rim of anterior peripancreatic fluid is noted and interval cholecystectomy with clips in the gallbladder fossa. A surgical drain enters the right abdomen from the anterolateral aspect just below the ribcage, courses cephalad than medially extending underneath the left lobe of the liver then turns laterally into the gallbladder fossa region. There is a fluid collection with small air pockets around and extending above the drain in the gallbladder fossa, measuring 6.3 x 2.6 by 2.9 cm. This could be normal postoperative fluid and residual air but could also indicate evidence of a developing abscess or biloma. Clinical correlation is advised. There is additional trace air in the porta hepatis to the left. Pancreas: Partially atrophic and otherwise unremarkable. Spleen: Normal size and noncontrast attenuation. Adrenals/Urinary Tract: No adrenal mass. Asymmetric volume loss and small size of the right kidney is again  shown. Parapelvic left renal cysts are again noted up to 3.8 cm, stable. There are a few punctate nonobstructive caliceal stones in the inferior pole of the left kidney. On the right there is a 6.5 mm stone which was previously in an interpolar infundibulum and has moved more medially into the renal pelvis just above the UPJ, but there is no hydronephrosis. This potentially could intermittently obstruct the UPJ. Again noted is a nonobstructive 2 mm caliceal stone in the right inferior pole. No renal masses seen without contrast. Both ureters are small caliber without obstructing stones. There is no bladder thickening. Stomach/Bowel: No dilatation, wall thickening or inflammatory changes of the stomach, small bowel and appendix. Mild fecal stasis ascending and transverse colon. Diverticulosis is seen greatest in the descending and sigmoid region. There is new faint inflammatory stranding alongside a short-segment of the mid sigmoid colon on 3: 76-78 and on 6: 64-69 compatible with a mild acute short segment diverticulitis. There is no abscess or free air. Vascular/Lymphatic: Aortic atherosclerosis. No enlarged abdominal or pelvic lymph nodes. Reproductive: Prostate is unremarkable. Other: In addition to a small amount of perihepatic fluid there is additional free fluid, also small amount in the distal right paracolic gutter. No free air is seen outside the in  the gallbladder fossa. There is no free hemorrhage or further free fluid. There are pelvic phleboliths. There is no incarcerated hernia. Musculoskeletal: Osteopenia and degenerative change of the spine. Postsurgical change of umbilical hernia repair with old mesh screws. IMPRESSION: 1. Interval cholecystectomy with a surgical drain in the gallbladder fossa. There is a 6.3 x 2.6 x 2.9 cm fluid collection with small air pockets around and extending above the drain in the gallbladder fossa. This could be normal postoperative fluid and residual air but could also  indicate evidence of a developing abscess or biloma. Follow-up as indicated. 2. Small rim of anterior perihepatic fluid and additional small amount of fluid in the right paracolic gutter. 3. New small right and trace left pleural effusions with adjacent atelectasis. 4. Mild acute short segment mid sigmoid diverticulitis. No abscess or free air. 5. 6.5 mm stone in the right renal pelvis just above the UPJ, without hydronephrosis. This could intermittently obstruct the UPJ but at present there is no hydronephrosis. 6. Additional nonobstructive micronephrolithiasis, asymmetric right renal atrophy and scarring. 7. Aortic atherosclerosis. 8. Constipation. 9. Osteopenia and degenerative change. Aortic Atherosclerosis (ICD10-I70.0). Electronically Signed   By: Almira Bar M.D.   On: 11/14/2022 21:45   DG Cholangiogram Operative  Result Date: 11/08/2022 CLINICAL DATA:  Cholecystectomy for symptomatic cholelithiasis. EXAM: INTRAOPERATIVE CHOLANGIOGRAM TECHNIQUE: Cholangiographic images from the C-arm fluoroscopic device were submitted for interpretation post-operatively. Please see the procedural report for the amount of contrast and the fluoroscopy time utilized. FLUOROSCOPY: Radiation Exposure Index (as provided by the fluoroscopic device): 5.3 mGy Kerma COMPARISON:  Right upper quadrant ultrasound on 11/07/2022 FINDINGS: Submitted imaging obtained with a C-arm intraoperatively demonstrates focal contrast extravasation at the site of catheter injection. Bile ducts are not opacified. IMPRESSION: Nondiagnostic intraoperative cholangiogram due to contrast extravasation. Electronically Signed   By: Irish Lack M.D.   On: 11/08/2022 08:06   DG C-Arm 1-60 Min-No Report  Result Date: 11/07/2022 Fluoroscopy was utilized by the requesting physician.  No radiographic interpretation.   DG C-Arm 1-60 Min-No Report  Result Date: 11/07/2022 Fluoroscopy was utilized by the requesting physician.  No radiographic  interpretation.   US Abdomen Limited RUQ (LIVER/GB)  Result Date: 11/07/2022 CLINICAL DATA:  Colicky right upper quadrant abdominal pain EXAM: ULTRASOUND ABDOMEN LIMITED RIGHT UPPER QUADRANT COMPARISON:  None Available. FINDINGS: Gallbladder: Gallstones. No gallbladder wall thickening. No sonographic Murphy sign noted by sonographer. Common bile duct: Diameter: 0.3 cm. Liver: No focal lesion identified. Increased parenchymal echogenicity. Portal vein is patent on color Doppler imaging with normal direction of blood flow towards the liver. Other: None. IMPRESSION: 1. Cholelithiasis without sonographic evidence of acute cholecystitis. 2. Hepatic steatosis. Electronically Signed   By: Jearld Lesch M.D.   On: 11/07/2022 11:02   US Abdomen Limited RUQ (LIVER/GB)  Result Date: 10/29/2022 CLINICAL DATA:  Right upper quadrant pain. EXAM: ULTRASOUND ABDOMEN LIMITED RIGHT UPPER QUADRANT COMPARISON:  CT abdomen pelvis dated 09/22/2022. FINDINGS: Gallbladder: Multiple gallstones are noted. No gallbladder wall thickening visualized. No sonographic Murphy sign noted by sonographer. Common bile duct: Diameter: 2 mm Liver: No focal lesion identified. Within normal limits for parenchymal echogenicity. Portal vein is patent on color Doppler imaging with normal direction of blood flow towards the liver. Other: None. IMPRESSION: 1. Cholelithiasis with no findings of acute cholecystitis. Electronically Signed   By: Romona Curls M.D.   On: 10/29/2022 11:01    Labs: Basic Metabolic Panel: Recent Labs  Lab 11/14/22 2048 11/15/22 0215 11/16/22 9147 11/17/22 0252 11/18/22 8295  11/19/22 1140 11/20/22 0210 11/21/22 0238  NA 136 136 139 137 136 136 137 135  K 3.4* 4.3 4.3 4.1 4.5 4.1 4.4 5.3*  CL 100 98 103 108 108 106 108 107  CO2 25 26 24 22 23 23 23 22   GLUCOSE 144* 132* 112* 107* 113* 97 108* 140*  BUN 35* 37* 36* 33* 27* 20 24* 31*  CREATININE 2.17* 2.32* 2.35* 2.22* 2.12* 2.10* 1.91* 2.36*  CALCIUM 8.8* 8.7*  8.7* 7.8* 8.0* 8.4* 8.2* 8.2*   Liver Function Tests: Recent Labs  Lab 11/17/22 0252 11/18/22 0334 11/19/22 1140 11/20/22 0210 11/21/22 0238  AST 30 55* 51* 64* 44*  ALT 71* 91* 97* 104* 91*  ALKPHOS 173* 228* 231* 319* 306*  BILITOT 0.9 0.9 1.3* 1.4* 0.7  PROT 5.2* 5.6* 6.0* 5.5* 5.7*  ALBUMIN 2.5* 2.6* 2.7* 2.5* 2.6*    CBC: Recent Labs  Lab 11/14/22 2048 11/15/22 0215 11/16/22 0331 11/17/22 0252 11/18/22 0334 11/21/22 0238  WBC 11.1* 10.0 8.0 4.6 7.2 9.5  NEUTROABS 6.4  --   --   --   --  7.7  HGB 13.1 13.0 12.6* 11.3* 11.3* 10.6*  HCT 40.1 38.0* 39.2 33.0* 33.4* 31.4*  MCV 94.6 93.1 96.8 93.2 95.2 94.0  PLT 254 197 204 170 181 225    CBG: No results for input(s): "GLUCAP" in the last 168 hours.  Principal Problem:   Postoperative pain   Time coordinating discharge: 15 min

## 2022-11-21 NOTE — Progress Notes (Signed)
Discharge summary packet provided to pt with instructions. Pt verbalized understanding of instructions. No complaints. Pt d/c to home as ordered. Spouse is responsible for pt's ride.

## 2022-11-21 NOTE — Plan of Care (Signed)
Dressing on the surgical site was changed this morning per pt request. Dried yellow scant drainage noticed on the old dressing. No drainage seen on the surgical site.  Problem: Education: Goal: Knowledge of General Education information will improve Description: Including pain rating scale, medication(s)/side effects and non-pharmacologic comfort measures Outcome: Progressing   Problem: Health Behavior/Discharge Planning: Goal: Ability to manage health-related needs will improve Outcome: Progressing   Problem: Clinical Measurements: Goal: Ability to maintain clinical measurements within normal limits will improve Outcome: Progressing   Problem: Activity: Goal: Risk for activity intolerance will decrease Outcome: Progressing   Problem: Nutrition: Goal: Adequate nutrition will be maintained Outcome: Progressing   Problem: Coping: Goal: Level of anxiety will decrease Outcome: Progressing   Problem: Elimination: Goal: Will not experience complications related to bowel motility Outcome: Progressing   Problem: Pain Managment: Goal: General experience of comfort will improve Outcome: Progressing   Problem: Safety: Goal: Ability to remain free from injury will improve Outcome: Progressing   Problem: Skin Integrity: Goal: Risk for impaired skin integrity will decrease Outcome: Progressing

## 2022-11-23 ENCOUNTER — Encounter (HOSPITAL_COMMUNITY): Payer: Self-pay | Admitting: Gastroenterology

## 2022-12-08 NOTE — Discharge Summary (Signed)
Physician Discharge Summary  Donald Vaughn NFA:[id number] DOB: 01-Feb-1951 DOA: 11/14/2022  PCP: Donald Brunette, MD  Admit date: 11/14/2022 Discharge date: 11/21/2022   Recommendations for Outpatient Follow-up:   (include homehealth, outpatient follow-up instructions, specific recommendations for PCP to follow-up on, etc.)   Follow-up Information     Donald Vaughn, Donald Slade, PA-C. Go on 12/03/2022.   Specialty: General Vaughn Why: follow up on 4/24 at 1:30 pm. Please arrive 15 minutes early to complete check in, and bring photo ID and insurance card. Contact information: [address] [place] [address] [phone]         Donald Brunette, MD Follow up.   Specialty: Internal Medicine Contact information: [address] [place] [address] [phone]                Discharge Diagnoses:  Principal Problem:   Postoperative pain   Surgical Procedure: lap chole, ERCP  Discharge Condition: Good Disposition: Home  Diet recommendation: low fat diet   Hospital Course:  72 yo male admitted 1 week after lap chole with concern for bile leak. Drain came out 4/7 because of clog. He was found to have choledocholithiasis. He underwent ERCP and symptoms resolved. He was discharged home 4/12.  Discharge Instructions  Discharge Instructions     Diet - low sodium heart healthy   Complete by: As directed    Increase activity slowly   Complete by: As directed       Allergies as of 11/21/2022       Reactions   Cats Claw [uncaria Tomentosa (cats Claw)]    allergy   Codeine    Stomach upset   Oxycodone-aspirin    REACTION: NAUSEA AND VOMITING   Peanut Butter Flavor    Swelling/welts   Septra [sulfamethoxazole-trimethoprim]    rash   Sulfonamide Derivatives    REACTION: SWELLING        Medication List     STOP taking these medications    methocarbamol 750 MG tablet Commonly known as: Robaxin-750        TAKE these medications    acetaminophen 500 MG tablet Commonly known as: TYLENOL Take 2 tablets (1,000 mg total) by mouth every 6 (six) hours.   CALCIUM PO Take 1 tablet by mouth daily.   docusate sodium 100 MG capsule Commonly known as: COLACE Take 1 capsule (100 mg total) by mouth 2 (two) times daily.   famotidine 20 MG tablet Commonly known as: PEPCID Take 1 tablet (20 mg total) by mouth 2 (two) times daily.   finasteride 5 MG tablet Commonly known as: PROSCAR Take 5 mg by mouth daily.   HYALURONIC ACID PO Take 1 tablet by mouth daily.   lidocaine 2 % solution Commonly known as: XYLOCAINE Use as directed 10 mLs in the mouth or throat every 6 (six) hours as needed (stomach pain).   losartan-hydrochlorothiazide 100-12.5 MG tablet Commonly known as: HYZAAR Take 0.5 tablets by mouth daily.   Maalox Max 400-400-40 MG/5ML suspension Generic drug: alum & mag hydroxide-simeth Take 10 mLs by mouth every 6 (six) hours as needed for indigestion.   oxyCODONE 5 MG immediate release tablet Commonly known as: Oxy IR/ROXICODONE Take 1 tablet (5 mg total) by mouth every 4 (four) hours as needed for severe pain.   predniSONE 20 MG tablet Commonly known as: DELTASONE Take 3 po QD x 3d , then 2 po QD x 3d then 1 po QD x 3d  PRESERVISION AREDS PO Take 1 tablet by mouth daily.       ASK your doctor about these medications    Methocarbamol 1000 MG Tabs Take 1,000 mg by mouth every 8 (eight) hours as needed for up to 3 days for muscle spasms (pain). Ask about: Should I take this medication?        Follow-up Information     Donald Vaughn, Donald Vaugh[place], [place]. Go on 12/03/2022.   Specialty: General [place] Why: follow up on 4/24 at 1:30 pm. Please arrive 15 minutes early to complete check in, and bring photo ID and insurance card. Contact information: [address] [place] [address] [place] [address] [phone]         Donald Brunette, MD  Follow up.   Specialty: Internal Medicine Contact information: [address] [place] [address] [phone]                  The results of significant diagnostics from this hospitalization (including imaging, microbiology, ancillary and laboratory) are listed below for reference.    Significant Diagnostic Studies: DG ERCP  Result Date: 11/20/2022 CLINICAL DATA:  Bile duct stone. EXAM: ERCP TECHNIQUE: Multiple spot images obtained with the fluoroscopic device and submitted for interpretation post-procedure. FLUOROSCOPY: Radiation Exposure Index (as provided by the fluoroscopic device): 28.191 COMPARISON:  MRCP 11/18/2022 FINDINGS: Retrograde cholangiogram demonstrates a small round filling defect in the distal common bile duct which could represent a stone. Wire was advanced into the intrahepatic bile ducts. No significant biliary dilatation. Cystic duct is patent with filling of the gallbladder. Small round filling defects in distal common bile duct on final image may represent gas bubbles. IMPRESSION: Probable small stone in the distal common bile duct. These images were submitted for radiologic interpretation only. Please see the procedural report for the amount of contrast. Electronically Signed   By: Richarda Overlie M.D.   On: 11/20/2022 11:29   MR ABDOMEN MRCP W WO CONTAST  Result Date: 11/18/2022 CLINICAL DATA:  Right upper quadrant pain. Status post cholecystectomy 11/07/2022. EXAM: MRI ABDOMEN WITHOUT AND WITH CONTRAST (INCLUDING MRCP) TECHNIQUE: Multiplanar multisequence MR imaging of the abdomen was performed both before and after the administration of intravenous contrast. Heavily T2-weighted images of the biliary and pancreatic ducts were obtained, and three-dimensional MRCP images were rendered by post processing. CONTRAST:  9mL GADAVIST GADOBUTROL 1 MMOL/ML IV SOLN COMPARISON:  CT scan 11/14/2022 FINDINGS: Lower chest: Unremarkable. Hepatobiliary: No suspicious  focal abnormality within the liver parenchyma. 5.1 x 3.3 x 3.3 cm heterogeneous collection is identified in the gallbladder fossa. Lesion is prominent intrinsic T1 shortening compatible with blood clot or highly proteinaceous material. After IV contrast administration, there is no rim enhancement associated with this collection. Trace intrahepatic biliary duct prominence with no dilatation of the common duct or common bile duct. While axial T2 imaging does demonstrate some central flow artifact in the distal common bile duct, there is an additional 2 mm filling defect along the dependent wall of the more distal common bile duct (see axial T2 haste image 23 of series 4, coronal haste image 19 of series 5, and axial MRCP image 10 of series 15). Pancreas: No focal mass lesion. No dilatation of the main duct. No intraparenchymal cyst. No peripancreatic edema. Spleen:  No splenomegaly. No focal mass lesion. Adrenals/Urinary Tract: No adrenal nodule or mass. Right renal atrophy evident. Central sinus cysts noted in the left kidney. Stomach/Bowel: Stomach is unremarkable. No gastric wall thickening.  No evidence of outlet obstruction. Duodenum is normally positioned as is the ligament of Treitz. No small bowel or colonic dilatation within the visualized abdomen. Vascular/Lymphatic: No abdominal aortic aneurysm. No abdominal lymphadenopathy. Other: Scattered tiny foci of fluid identified in the mesentery with trace free fluid in the pericolic gutters bilaterally. Musculoskeletal: No focal suspicious marrow enhancement within the visualized bony anatomy. IMPRESSION: 1. 5.1 x 3.3 x 3.3 cm heterogeneous collection in the gallbladder fossa with prominent intrinsic T1 shortening compatible with blood clot or highly proteinaceous material. Features likely reflect a small postoperative hematoma. No rim enhancement associated with this collection. 2. Trace intrahepatic biliary duct prominence with upper normal common bile duct  diameter at 5-6 mm. Tiny 2 mm filling defect along the dependent wall of the distal common bile duct is compatible with choledocholithiasis. 3. Scattered tiny foci of fluid in the mesentery with trace free fluid in the paracolic gutters bilaterally. Electronically Signed   By: Kennith Center M.D.   On: 11/18/2022 10:45   MR 3D Recon At Scanner  Result Date: 11/18/2022 CLINICAL DATA:  Right upper quadrant pain. Status post cholecystectomy 11/07/2022. EXAM: MRI ABDOMEN WITHOUT AND WITH CONTRAST (INCLUDING MRCP) TECHNIQUE: Multiplanar multisequence MR imaging of the abdomen was performed both before and after the administration of intravenous contrast. Heavily T2-weighted images of the biliary and pancreatic ducts were obtained, and three-dimensional MRCP images were rendered by post processing. CONTRAST:  9mL GADAVIST GADOBUTROL 1 MMOL/ML IV SOLN COMPARISON:  CT scan 11/14/2022 FINDINGS: Lower chest: Unremarkable. Hepatobiliary: No suspicious focal abnormality within the liver parenchyma. 5.1 x 3.3 x 3.3 cm heterogeneous collection is identified in the gallbladder fossa. Lesion is prominent intrinsic T1 shortening compatible with blood clot or highly proteinaceous material. After IV contrast administration, there is no rim enhancement associated with this collection. Trace intrahepatic biliary duct prominence with no dilatation of the common duct or common bile duct. While axial T2 imaging does demonstrate some central flow artifact in the distal common bile duct, there is an additional 2 mm filling defect along the dependent wall of the more distal common bile duct (see axial T2 haste image 23 of series 4, coronal haste image 19 of series 5, and axial MRCP image 10 of series 15). Pancreas: No focal mass lesion. No dilatation of the main duct. No intraparenchymal cyst. No peripancreatic edema. Spleen:  No splenomegaly. No focal mass lesion. Adrenals/Urinary Tract: No adrenal nodule or mass. Right renal atrophy  evident. Central sinus cysts noted in the left kidney. Stomach/Bowel: Stomach is unremarkable. No gastric wall thickening. No evidence of outlet obstruction. Duodenum is normally positioned as is the ligament of Treitz. No small bowel or colonic dilatation within the visualized abdomen. Vascular/Lymphatic: No abdominal aortic aneurysm. No abdominal lymphadenopathy. Other: Scattered tiny foci of fluid identified in the mesentery with trace free fluid in the pericolic gutters bilaterally. Musculoskeletal: No focal suspicious marrow enhancement within the visualized bony anatomy. IMPRESSION: 1. 5.1 x 3.3 x 3.3 cm heterogeneous collection in the gallbladder fossa with prominent intrinsic T1 shortening compatible with blood clot or highly proteinaceous material. Features likely reflect a small postoperative hematoma. No rim enhancement associated with this collection. 2. Trace intrahepatic biliary duct prominence with upper normal common bile duct diameter at 5-6 mm. Tiny 2 mm filling defect along the dependent wall of the distal common bile duct is compatible with choledocholithiasis. 3. Scattered tiny foci of fluid in the mesentery with trace free fluid in the paracolic gutters bilaterally. Electronically Signed   By:  Kennith Center M.D.   On: 11/18/2022 10:45   NM HEPATOBILIARY LEAK (POST-SURGICAL)  Result Date: 11/15/2022 CLINICAL DATA:  One-week status post laparoscopic cholecystectomy. Right upper quadrant pain. Gallbladder fossa fluid collection on recent CT. Evaluate for bile leak. EXAM: NUCLEAR MEDICINE HEPATOBILIARY IMAGING TECHNIQUE: Sequential images of the abdomen were obtained out to 60 minutes following intravenous administration of radiopharmaceutical. RADIOPHARMACEUTICALS:  5.4 mCi Tc-8m  Choletec IV COMPARISON:  None Available. FINDINGS: Prompt uptake and biliary excretion of activity by the liver is seen. No gallbladder activity is seen, consistent with cholecystectomy. Biliary activity passes into  small bowel, consistent with patent common bile duct. There is no evidence of extravasation of biliary activity. IMPRESSION: Prior cholecystectomy. No evidence of biliary obstruction or postop bile leak. Electronically Signed   By: Danae Orleans M.D.   On: 11/15/2022 11:27   CT ABDOMEN PELVIS WO CONTRAST  Result Date: 11/14/2022 CLINICAL DATA:  Seven days postoperative from 11/07/2022 cholecystectomy, presents with postoperative right upper quadrant pain, no output from surgical drain. EXAM: CT ABDOMEN AND PELVIS WITHOUT CONTRAST TECHNIQUE: Multidetector CT imaging of the abdomen and pelvis was performed following the standard protocol without IV contrast. RADIATION DOSE REDUCTION: This exam was performed according to the departmental dose-optimization program which includes automated exposure control, adjustment of the mA and/or kV according to patient size and/or use of iterative reconstruction technique. COMPARISON:  CTs without contrast 09/22/2022 and 04/01/2022. FINDINGS: Lower chest: There is a new small layering right and trace left pleural effusions, mild adjacent atelectasis in the posterior lungs. There are scattered ground-glass disease in the lung bases but no acute pneumonic infiltrate. The cardiac size is normal. Hepatobiliary: No focal abnormality is seen in the unenhanced liver. No pneumobilia. Small rim of anterior peripancreatic fluid is noted and interval cholecystectomy with clips in the gallbladder fossa. A surgical drain enters the right abdomen from the anterolateral aspect just below the ribcage, courses cephalad than medially extending underneath the left lobe of the liver then turns laterally into the gallbladder fossa region. There is a fluid collection with small air pockets around and extending above the drain in the gallbladder fossa, measuring 6.3 x 2.6 by 2.9 cm. This could be normal postoperative fluid and residual air but could also indicate evidence of a developing abscess or  biloma. Clinical correlation is advised. There is additional trace air in the porta hepatis to the left. Pancreas: Partially atrophic and otherwise unremarkable. Spleen: Normal size and noncontrast attenuation. Adrenals/Urinary Tract: No adrenal mass. Asymmetric volume loss and small size of the right kidney is again shown. Parapelvic left renal cysts are again noted up to 3.8 cm, stable. There are a few punctate nonobstructive caliceal stones in the inferior pole of the left kidney. On the right there is a 6.5 mm stone which was previously in an interpolar infundibulum and has moved more medially into the renal pelvis just above the UPJ, but there is no hydronephrosis. This potentially could intermittently obstruct the UPJ. Again noted is a nonobstructive 2 mm caliceal stone in the right inferior pole. No renal masses seen without contrast. Both ureters are small caliber without obstructing stones. There is no bladder thickening. Stomach/Bowel: No dilatation, wall thickening or inflammatory changes of the stomach, small bowel and appendix. Mild fecal stasis ascending and transverse colon. Diverticulosis is seen greatest in the descending and sigmoid region. There is new faint inflammatory stranding alongside a short-segment of the mid sigmoid colon on 3: 76-78 and on 6: 64-69 compatible with a  mild acute short segment diverticulitis. There is no abscess or free air. Vascular/Lymphatic: Aortic atherosclerosis. No enlarged abdominal or pelvic lymph nodes. Reproductive: Prostate is unremarkable. Other: In addition to a small amount of perihepatic fluid there is additional free fluid, also small amount in the distal right paracolic gutter. No free air is seen outside the in the gallbladder fossa. There is no free hemorrhage or further free fluid. There are pelvic phleboliths. There is no incarcerated hernia. Musculoskeletal: Osteopenia and degenerative change of the spine. Postsurgical change of umbilical hernia repair  with old mesh screws. IMPRESSION: 1. Interval cholecystectomy with a surgical drain in the gallbladder fossa. There is a 6.3 x 2.6 x 2.9 cm fluid collection with small air pockets around and extending above the drain in the gallbladder fossa. This could be normal postoperative fluid and residual air but could also indicate evidence of a developing abscess or biloma. Follow-up as indicated. 2. Small rim of anterior perihepatic fluid and additional small amount of fluid in the right paracolic gutter. 3. New small right and trace left pleural effusions with adjacent atelectasis. 4. Mild acute short segment mid sigmoid diverticulitis. No abscess or free air. 5. 6.5 mm stone in the right renal pelvis just above the UPJ, without hydronephrosis. This could intermittently obstruct the UPJ but at present there is no hydronephrosis. 6. Additional nonobstructive micronephrolithiasis, asymmetric right renal atrophy and scarring. 7. Aortic atherosclerosis. 8. Constipation. 9. Osteopenia and degenerative change. Aortic Atherosclerosis (ICD10-I70.0). Electronically Signed   By: Almira Bar M.D.   On: 11/14/2022 21:45    Labs: Basic Metabolic Panel: No results for input(s): "NA", "K", "CL", "CO2", "GLUCOSE", "BUN", "CREATININE", "CALCIUM", "MG", "PHOS" in the last 168 hours. Liver Function Tests: No results for input(s): "AST", "ALT", "ALKPHOS", "BILITOT", "PROT", "ALBUMIN" in the last 168 hours.  CBC: No results for input(s): "WBC", "NEUTROABS", "HGB", "HCT", "MCV", "PLT" in the last 168 hours.  CBG: No results for input(s): "GLUCAP" in the last 168 hours.  Principal Problem:   Postoperative pain   Time coordinating discharge: 15 min

## 2024-05-11 ENCOUNTER — Other Ambulatory Visit: Payer: Self-pay | Admitting: Urology

## 2024-05-15 NOTE — Progress Notes (Incomplete Revision)
 COVID Vaccine received:  []  No []  Yes Date of any COVID positive Test in last 90 days:  PCP - Ryan Hives, MD [phone]  Cardiologist - remote in 2010, Dr. Verlin  Chest x-ray -  EKG - 2010   repeated  Stress Test - Myoview 2006 negative ECHO -  07-03-2009 Cardiac Cath -  CT Coronary Calcium score: 0 on 03-21-2022  Bowel Prep - [x]  No  []   Yes ______  Pacemaker / ICD device [x]  No []  Yes   Spinal Cord Stimulator:[x]  No []  Yes       History of Sleep Apnea? [x]  No []  Yes   CPAP used?- [x]  No []  Yes    Patient has: []  NO Hx DM   [x]  Pre-DM   []  DM1  []   DM2 Does the patient monitor blood sugar?   []  N/A   [x]  No []  Yes   Blood Thinner / Instructions:  None Aspirin Instructions:  None  Dental hx: []  Dentures:  []  N/A      []  Bridge or Partial:                   []  Loose or Damaged teeth:   Comments:   Activity level: Able to walk up 2 flights of stairs without becoming significantly short of breath or having chest pain?  []  No   []    Yes  Patient can perform ADLs without assistance. []  No   []   Yes  Anesthesia review: HTN, Hx ETOH- abn. LFTs, Remote palps, atypical CP worked up 2010, Tinnitus- hearing loss  Patient denies any S&S of respiratory illness or Covid - no shortness of breath, fever, cough or chest pain at PAT appointment.  Patient verbalized understanding and agreement to the Pre-Surgical Instructions that were given to them at this PAT appointment. Patient was also educated of the need to review these PAT instructions again prior to his/her surgery.I reviewed the appropriate phone numbers to call if they have any and questions or concerns.

## 2024-05-15 NOTE — Patient Instructions (Signed)
 SURGICAL WAITING ROOM VISITATION Patients having surgery or a procedure may have no more than 2 support people in the waiting area - these visitors may rotate in the visitor waiting room.   If the patient needs to stay at the hospital during part of their recovery, the visitor guidelines for inpatient rooms apply.  PRE-OP VISITATION  Pre-op nurse will coordinate an appropriate time for 1 support person to accompany the patient in pre-op.  This support person may not rotate.  This visitor will be contacted when the time is appropriate for the visitor to come back in the pre-op area.  Please refer to the [facility] website for the visitor guidelines for Inpatients (after your surgery is over and you are in a regular room).  You are not required to quarantine at this time prior to your surgery. However, you must do this: Hand Hygiene often Do NOT share personal items Notify your provider if you are in close contact with someone who has COVID or you develop fever 100.4 or greater, new onset of sneezing, cough, sore throat, shortness of breath or body aches.  If you test positive for Covid or have been in contact with anyone that has tested positive in the last 10 days please notify you surgeon.    Your procedure is scheduled on:  FRIDAY  May 20, 2024  Report to [facility] Main Entrance: [address] entrance where the [employer] is available.   Report to admitting at:  11:15   AM  Call this number if you have any questions or problems the morning of surgery [phone]  DO NOT EAT ANYTHING AFTER MIDNIGHT THE NIGHT PRIOR TO YOUR SURGERY / PROCEDURE.  After Midnight you may have Water, or Sports drinks like Gatorade or Powerade (No RED color) until   07:00    am the morning of your surgery.  After 07:00  am, do not drink or eat anything; No candy, chewing gum or throat lozenges.   FOLLOW  ANY ADDITIONAL PRE OP INSTRUCTIONS YOU RECEIVED FROM YOUR SURGEON'S OFFICE!!!   Oral  Hygiene is also important to reduce your risk of infection.        Remember - BRUSH YOUR TEETH THE MORNING OF SURGERY WITH YOUR REGULAR TOOTHPASTE  Do NOT smoke after Midnight the night before surgery.  STOP TAKING all Vitamins, Herbs and supplements 1 week before your surgery.   Take ONLY these medicines the morning of surgery with A SIP OF WATER: Tamsulosin if needed and you may take Oxycodone  APAP if needed for pain.  DO NOT TAKE LOSARTAN  / HCTZ the morning of your surgery.                   You may not have any metal on your body including  jewelry, and body piercing  Do not wear  lotions, powders, cologne, or deodorant  Men may shave face and neck.  Contacts, Hearing Aids, dentures or bridgework may not be worn into surgery. DENTURES WILL BE REMOVED PRIOR TO SURGERY PLEASE DO NOT APPLY Poly grip OR ADHESIVES!!!  Patients discharged on the day of surgery will not be allowed to drive home.  Someone NEEDS to stay with you for the first 24 hours after anesthesia.  Do not bring your home medications to the hospital. The Pharmacy will dispense medications listed on your medication list to you during your admission in the Hospital.   Please read over the following fact sheets you were given: IF YOU HAVE  QUESTIONS ABOUT YOUR PRE-OP INSTRUCTIONS, PLEASE CALL [phone].   [facility] - Preparing for Surgery Before surgery, you can play an important role.  Because skin is not sterile, your skin needs to be as free of germs as possible.  You can reduce the number of germs on your skin by washing with CHG (chlorahexidine gluconate) soap before surgery.  CHG is an antiseptic cleaner which kills germs and bonds with the skin to continue killing germs even after washing. Please DO NOT use if you have an allergy to CHG or antibacterial soaps.  If your skin becomes reddened/irritated stop using the CHG and inform your nurse when you arrive at Short Stay. Do not shave (including legs and  underarms) for at least 48 hours prior to the first CHG shower.  You may shave your face/neck.  Please follow these instructions carefully:   1.  Shower with CHG Soap the night before surgery ONLY (DO NOT USE THE SOAP THE MORNING OF SURGERY).  2.  If you choose to wash your hair, wash your hair first as usual with your normal  shampoo.  3.  After you shampoo, rinse your hair and body thoroughly to remove the shampoo.                             4.  Use CHG as you would any other liquid soap.  You can apply chg directly to the skin and wash.  Gently with a scrungie or clean washcloth.  5.  Apply the CHG Soap to your body ONLY FROM THE NECK DOWN.   Do not use on face/ open                           Wound or open sores. Avoid contact with eyes, ears mouth and genitals (private parts).                       Wash face,  Genitals (private parts) with your normal soap.             6.  Wash thoroughly, paying special attention to the area where your  surgery  will be performed.  7.  Thoroughly rinse your body with warm water from the neck down.  8.  DO NOT shower/wash with your normal soap after using and rinsing off the CHG Soap.                9.  Pat yourself dry with a clean towel.            10.  Wear clean pajamas.            11.  Place clean sheets on your bed the night of your first shower and do not  sleep with pets.  Day of Surgery :  Do not apply any CHG, lotions/deodorants the morning of surgery.  Please wear clean clothes to the hospital/surgery center.   FAILURE TO FOLLOW THESE INSTRUCTIONS MAY RESULT IN THE CANCELLATION OF YOUR SURGERY  PATIENT SIGNATURE_________________________________  NURSE SIGNATURE__________________________________  ________________________________________________________________________

## 2024-05-15 NOTE — Progress Notes (Signed)
 COVID Vaccine received:  []  No []  Yes Date of any COVID positive Test in last 90 days:  PCP - Ryan Hives, MD [phone]  Cardiologist - remote in 2010, Dr. Verlin  Chest x-ray -  EKG - 2010   repeated  Stress Test - Myoview 2006 negative ECHO -  07-03-2009 Cardiac Cath -  CT Coronary Calcium score: 0 on 03-21-2022  Bowel Prep - [x]  No  []   Yes ______  Pacemaker / ICD device [x]  No []  Yes   Spinal Cord Stimulator:[x]  No []  Yes       History of Sleep Apnea? [x]  No []  Yes   CPAP used?- [x]  No []  Yes    Patient has: []  NO Hx DM   [x]  Pre-DM   []  DM1  []   DM2 Does the patient monitor blood sugar?   []  N/A   [x]  No []  Yes   Blood Thinner / Instructions:  None Aspirin Instructions:  None  Dental hx: []  Dentures:  []  N/A      []  Bridge or Partial:                   []  Loose or Damaged teeth:   Comments:   Activity level: Able to walk up 2 flights of stairs without becoming significantly short of breath or having chest pain?  []  No   []    Yes  Patient can perform ADLs without assistance. []  No   []   Yes  Anesthesia review: HTN, Hx ETOH- abn. LFTs, Remote palps, atypical CP worked up 2010, Tinnitus- hearing loss  Patient denies any S&S of respiratory illness or Covid - no shortness of breath, fever, cough or chest pain at PAT appointment.  Patient verbalized understanding and agreement to the Pre-Surgical Instructions that were given to them at this PAT appointment. Patient was also educated of the need to review these PAT instructions again prior to his/her surgery.I reviewed the appropriate phone numbers to call if they have any and questions or concerns.

## 2024-05-17 ENCOUNTER — Encounter (HOSPITAL_COMMUNITY): Payer: Self-pay

## 2024-05-17 ENCOUNTER — Other Ambulatory Visit: Payer: Self-pay

## 2024-05-17 ENCOUNTER — Encounter (HOSPITAL_COMMUNITY)
Admission: RE | Admit: 2024-05-17 | Discharge: 2024-05-17 | Disposition: A | Discharge status: Home / Self Care | Source: Ambulatory Visit | Attending: Urology | Admitting: Urology

## 2024-05-17 VITALS — BP 113/66 | HR 90 | Temp 97.5°F | Resp 14 | Ht 68.0 in | Wt 203.0 lb

## 2024-05-17 DIAGNOSIS — I10 Essential (primary) hypertension: Secondary | ICD-10-CM

## 2024-05-17 DIAGNOSIS — Z01818 Encounter for other preprocedural examination: Secondary | ICD-10-CM | POA: Insufficient documentation

## 2024-05-17 DIAGNOSIS — R7989 Other specified abnormal findings of blood chemistry: Secondary | ICD-10-CM | POA: Insufficient documentation

## 2024-05-17 DIAGNOSIS — N201 Calculus of ureter: Secondary | ICD-10-CM | POA: Insufficient documentation

## 2024-05-17 DIAGNOSIS — N183 Chronic kidney disease, stage 3 unspecified: Secondary | ICD-10-CM | POA: Diagnosis not present

## 2024-05-17 DIAGNOSIS — Z01810 Encounter for preprocedural cardiovascular examination: Secondary | ICD-10-CM | POA: Diagnosis present

## 2024-05-17 DIAGNOSIS — N400 Enlarged prostate without lower urinary tract symptoms: Secondary | ICD-10-CM | POA: Insufficient documentation

## 2024-05-17 DIAGNOSIS — I129 Hypertensive chronic kidney disease with stage 1 through stage 4 chronic kidney disease, or unspecified chronic kidney disease: Secondary | ICD-10-CM | POA: Insufficient documentation

## 2024-05-17 DIAGNOSIS — K219 Gastro-esophageal reflux disease without esophagitis: Secondary | ICD-10-CM | POA: Diagnosis not present

## 2024-05-17 DIAGNOSIS — F1021 Alcohol dependence, in remission: Secondary | ICD-10-CM | POA: Insufficient documentation

## 2024-05-17 DIAGNOSIS — Z01812 Encounter for preprocedural laboratory examination: Secondary | ICD-10-CM | POA: Diagnosis present

## 2024-05-17 DIAGNOSIS — R002 Palpitations: Secondary | ICD-10-CM

## 2024-05-17 HISTORY — DX: Personal history of urinary calculi: Z87.442

## 2024-05-17 HISTORY — DX: Cardiac arrhythmia, unspecified: I49.9

## 2024-05-17 HISTORY — DX: Chronic kidney disease, unspecified: N18.9

## 2024-05-17 HISTORY — DX: Pneumonia, unspecified organism: J18.9

## 2024-05-17 HISTORY — DX: Anxiety disorder, unspecified: F41.9

## 2024-05-17 LAB — COMPREHENSIVE METABOLIC PANEL WITH GFR
ALT: 16 U/L (ref 0–44)
AST: 19 U/L (ref 15–41)
Albumin: 4 g/dL (ref 3.5–5.0)
Alkaline Phosphatase: 143 U/L — ABNORMAL HIGH (ref 38–126)
Anion gap: 11 (ref 5–15)
BUN: 31 mg/dL — ABNORMAL HIGH (ref 8–23)
CO2: 25 mmol/L (ref 22–32)
Calcium: 9.3 mg/dL (ref 8.9–10.3)
Chloride: 108 mmol/L (ref 98–111)
Creatinine, Ser: 2.2 mg/dL — ABNORMAL HIGH (ref 0.61–1.24)
GFR, Estimated: 31 mL/min — ABNORMAL LOW (ref >60)
Glucose, Bld: 98 mg/dL (ref 70–99)
Potassium: 4.4 mmol/L (ref 3.5–5.1)
Sodium: 143 mmol/L (ref 135–145)
Total Bilirubin: 0.6 mg/dL (ref 0.0–1.2)
Total Protein: 7 g/dL (ref 6.5–8.1)

## 2024-05-17 LAB — CBC
HCT: 46.5 % (ref 39.0–52.0)
Hemoglobin: 14.7 g/dL (ref 13.0–17.0)
MCH: 30.6 pg (ref 26.0–34.0)
MCHC: 31.6 g/dL (ref 30.0–36.0)
MCV: 96.7 fL (ref 80.0–100.0)
Platelets: 194 K/uL (ref 150–400)
RBC: 4.81 MIL/uL (ref 4.22–5.81)
RDW: 13.2 % (ref 11.5–15.5)
WBC: 6.9 K/uL (ref 4.0–10.5)
nRBC: 0 % (ref 0.0–0.2)

## 2024-05-18 NOTE — Anesthesia Preprocedure Evaluation (Signed)
 Anesthesia Evaluation  Patient identified by MRN, date of birth, ID band Patient awake    Reviewed: Allergy & Precautions, H&P , NPO status , Patient's Chart, lab work & pertinent test results  Airway Mallampati: II  TM Distance: >3 FB Neck ROM: Full    Dental no notable dental hx.    Pulmonary neg pulmonary ROS   Pulmonary exam normal breath sounds clear to auscultation       Cardiovascular hypertension, Normal cardiovascular exam+ dysrhythmias  Rhythm:Regular Rate:Normal     Neuro/Psych  PSYCHIATRIC DISORDERS Anxiety     negative neurological ROS     GI/Hepatic ,GERD  ,,(+)     substance abuse  alcohol use  Endo/Other  negative endocrine ROS    Renal/GU  RIGHT URETERAL STONE  negative genitourinary   Musculoskeletal  (+) Arthritis ,    Abdominal   Peds negative pediatric ROS (+)  Hematology negative hematology ROS (+)   Anesthesia Other Findings Hx of difficult DL but easy mask and easy Glide  Reproductive/Obstetrics negative OB ROS                              Anesthesia Physical Anesthesia Plan  ASA: 3  Anesthesia Plan: General   Post-op Pain Management:    Induction: Intravenous  PONV Risk Score and Plan: 2 and Ondansetron  and Dexamethasone   Airway Management Planned: LMA  Additional Equipment: None  Intra-op Plan:   Post-operative Plan: Extubation in OR  Informed Consent: I have reviewed the patients History and Physical, chart, labs and discussed the procedure including the risks, benefits and alternatives for the proposed anesthesia with the patient or authorized representative who has indicated his/her understanding and acceptance.     Dental advisory given  Plan Discussed with: CRNA  Anesthesia Plan Comments: (See PAT note 05/17/2024)         Anesthesia Quick Evaluation

## 2024-05-18 NOTE — Progress Notes (Signed)
 Anesthesia Chart Review   Case: [id number] Date/Time: 05/20/24 1326   Procedure: CYSTOSCOPY/URETEROSCOPY/HOLMIUM LASER/STENT PLACEMENT (Right)   Anesthesia type: General   Diagnosis: Ureteral stone [N20.1]   Pre-op diagnosis: RIGHT URETERAL STONE   Location: [facility] ROOM 06 / [facility]   Surgeons: Selma Donnice SAUNDERS, MD       DISCUSSION:73 y.o. never smoker with h/o GERD, HTN, CKD Stage III, BPH, right ureteral stone scheduled for above procedure 05/20/24 with Dr. Donnice Selma.   Difficult airway documented.  Per anesthesia note 11/20/22, Difficulty Due To: Difficulty was anticipated, Difficult Airway- due to limited oral opening and Difficult Airway- due to anterior larynx Comments: Easy mask. DL #1 with miller 2; no view. DL #2 with glidescope. Easily passed ETT.  Pt reports he can climb a flight of stairs without difficulty, denies chest pain or shortness of breath. EKG with no acute findings.  VS: BP 113/66 Comment: right arm sitting  Pulse 90   Temp (!) 36.4 C (Oral)   Resp 14   Ht 5' 8 (1.727 m)   Wt 92.1 kg   SpO2 100%   BMI 30.87 kg/m   PROVIDERS: Clarice Nottingham, MD is PCP    LABS: Labs reviewed: Acceptable for surgery. (all labs ordered are listed, but only abnormal results are displayed)  Labs Reviewed  COMPREHENSIVE METABOLIC PANEL WITH GFR - Abnormal; Notable for the following components:      Result Value   BUN 31 (*)    Creatinine, Ser 2.20 (*)    Alkaline Phosphatase 143 (*)    GFR, Estimated 31 (*)    All other components within normal limits  CBC     IMAGES:   EKG:   CV:  Past Medical History:  Diagnosis Date   Alcohol abuse    hx   Anxiety    Arthritis    BCC (basal cell carcinoma of skin)    Benign prostatic hypertrophy    hx   BPH (benign prostatic hyperplasia)    BPH (benign prostatic hyperplasia)    Chest pain, atypical    Chronic kidney disease    Diverticulitis    Dysrhythmia    Palpitations   GERD (gastroesophageal reflux disease)     with history of esophagitis   Hearing loss    History of kidney stones    Hypercholesterolemia    Hypertension    Leukopenia    mild   Obese    Pneumonia    with COVID   Pre-diabetes    Rectal bleeding    Skin cancer    Tinnitus    chronic    Past Surgical History:  Procedure Laterality Date   BACK SURGERY     L4/5 Dr Madilyn   BILIARY DILATION  11/20/2022   Procedure: BILIARY DILATION;  Surgeon: Rosalie Kitchens, MD;  Location: [facility] ENDOSCOPY;  Service: Gastroenterology;;   SALVADOR  11/07/2022   Procedure: LAPAROSCOPIC CHOLECYSTECTOMY WITH INTRAOPERATIVE CHOLANGIOGRAM;  Surgeon: Paola Dreama SAILOR, MD;  Location: [facility] OR;  Service: General;;   COLONOSCOPY  09/11/2001   norma. (Dr. Geroge)   EGD- normal  09/11/2001   Dr. Geroge.   ERCP N/A 11/20/2022   Procedure: ENDOSCOPIC RETROGRADE CHOLANGIOPANCREATOGRAPHY (ERCP);  Surgeon: Rosalie Kitchens, MD;  Location: [facility] ENDOSCOPY;  Service: Gastroenterology;  Laterality: N/A;   EYE SURGERY Bilateral    cataract extraction  w/ IOL   HERNIA REPAIR     umbilical with PVP   left foot surgery     R shoulder surgery  11/09/2001   Dr. Jane   REMOVAL OF STONES  11/20/2022   Procedure: REMOVAL OF STONES;  Surgeon: Rosalie Kitchens, MD;  Location: [facility] ENDOSCOPY;  Service: Gastroenterology;;   ANNETT  11/20/2022   Procedure: ANNETT;  Surgeon: Rosalie Kitchens, MD;  Location: [facility] ENDOSCOPY;  Service: Gastroenterology;;    MEDICATIONS:  atorvastatin (LIPITOR) 40 MG tablet   B Complex-C-Folic Acid (RENAL VITAMIN PO)   cetirizine (ZYRTEC) 10 MG tablet   losartan -hydrochlorothiazide  (HYZAAR) 100-25 MG tablet   Misc Natural Products (NEURIVA PO)   Multiple Vitamins-Minerals (PRESERVISION AREDS PO)   Multiple Vitamins-Minerals (RENAL MULTIVITAMIN PO)   oxyCODONE -acetaminophen  (PERCOCET/ROXICET) 5-325 MG tablet   tamsulosin (FLOMAX) 0.4 MG CAPS capsule   No current facility-administered medications for this encounter.     Harlene Hoots  Ward, PA-C [facility] Pre-Surgical Testing [phone]

## 2024-05-20 ENCOUNTER — Encounter (HOSPITAL_COMMUNITY)
Admission: RE | Disposition: A | Discharge status: Home / Self Care | Payer: Self-pay | Source: Home / Self Care | Attending: Urology

## 2024-05-20 ENCOUNTER — Encounter (HOSPITAL_COMMUNITY): Payer: Self-pay | Admitting: Urology

## 2024-05-20 ENCOUNTER — Ambulatory Visit (HOSPITAL_COMMUNITY): Admitting: Physician Assistant

## 2024-05-20 ENCOUNTER — Ambulatory Visit (HOSPITAL_COMMUNITY)
Admission: RE | Admit: 2024-05-20 | Discharge: 2024-05-20 | Disposition: A | Discharge status: Home / Self Care | Attending: Urology | Admitting: Urology

## 2024-05-20 ENCOUNTER — Other Ambulatory Visit: Payer: Self-pay

## 2024-05-20 ENCOUNTER — Ambulatory Visit (HOSPITAL_COMMUNITY)

## 2024-05-20 DIAGNOSIS — I10 Essential (primary) hypertension: Secondary | ICD-10-CM

## 2024-05-20 DIAGNOSIS — N132 Hydronephrosis with renal and ureteral calculous obstruction: Secondary | ICD-10-CM | POA: Insufficient documentation

## 2024-05-20 DIAGNOSIS — N189 Chronic kidney disease, unspecified: Secondary | ICD-10-CM | POA: Insufficient documentation

## 2024-05-20 DIAGNOSIS — N529 Male erectile dysfunction, unspecified: Secondary | ICD-10-CM | POA: Diagnosis not present

## 2024-05-20 DIAGNOSIS — N201 Calculus of ureter: Secondary | ICD-10-CM

## 2024-05-20 DIAGNOSIS — N401 Enlarged prostate with lower urinary tract symptoms: Secondary | ICD-10-CM | POA: Diagnosis not present

## 2024-05-20 DIAGNOSIS — I129 Hypertensive chronic kidney disease with stage 1 through stage 4 chronic kidney disease, or unspecified chronic kidney disease: Secondary | ICD-10-CM | POA: Insufficient documentation

## 2024-05-20 DIAGNOSIS — Z79899 Other long term (current) drug therapy: Secondary | ICD-10-CM | POA: Diagnosis not present

## 2024-05-20 DIAGNOSIS — F419 Anxiety disorder, unspecified: Secondary | ICD-10-CM | POA: Diagnosis not present

## 2024-05-20 HISTORY — PX: CYSTOSCOPY/URETEROSCOPY/HOLMIUM LASER/STENT PLACEMENT: SHX6546

## 2024-05-20 SURGERY — CYSTOSCOPY/URETEROSCOPY/HOLMIUM LASER/STENT PLACEMENT
Anesthesia: General | Site: Ureter | Laterality: Right

## 2024-05-20 MED ORDER — LIDOCAINE 2% (20 MG/ML) 5 ML SYRINGE: INTRAMUSCULAR | Status: DC | PRN

## 2024-05-20 MED ORDER — PROPOFOL 10 MG/ML IV BOLUS
INTRAVENOUS | Status: DC | PRN
  Administered 2024-05-20: 200 mg via INTRAVENOUS

## 2024-05-20 MED ORDER — IOHEXOL 300 MG/ML  SOLN
INTRAMUSCULAR | Status: DC | PRN
  Administered 2024-05-20: 16 mL

## 2024-05-20 MED ORDER — CHLORHEXIDINE GLUCONATE 0.12 % MT SOLN
15.0000 mL | Freq: Once | OROMUCOSAL | Status: AC
  Administered 2024-05-20: 15 mL via OROMUCOSAL

## 2024-05-20 MED ORDER — LIDOCAINE HCL (PF) 2 % IJ SOLN
INTRAMUSCULAR | Status: AC
  Filled 2024-05-20: qty 5

## 2024-05-20 MED ORDER — OXYCODONE-ACETAMINOPHEN 5-325 MG PO TABS: 1.0000 | ORAL_TABLET | Freq: Four times a day (QID) | ORAL | 0 refills | Status: DC | PRN

## 2024-05-20 MED ORDER — CEPHALEXIN 500 MG PO CAPS: 500.0000 mg | ORAL_CAPSULE | Freq: Four times a day (QID) | ORAL | 0 refills | Status: AC

## 2024-05-20 MED ORDER — ONDANSETRON HCL 4 MG/2ML IJ SOLN
INTRAMUSCULAR | Status: AC
  Filled 2024-05-20: qty 2

## 2024-05-20 MED ORDER — OXYCODONE HCL 5 MG PO TABS: 5.0000 mg | ORAL_TABLET | Freq: Once | ORAL | Status: DC | PRN

## 2024-05-20 MED ORDER — LIDOCAINE HCL (PF) 2 % IJ SOLN
INTRAMUSCULAR | Status: DC | PRN
  Administered 2024-05-20: 100 mg via INTRADERMAL

## 2024-05-20 MED ORDER — ONDANSETRON HCL 4 MG/2ML IJ SOLN
INTRAMUSCULAR | Status: DC | PRN
  Administered 2024-05-20: 4 mg via INTRAVENOUS

## 2024-05-20 MED ORDER — DEXAMETHASONE SOD PHOSPHATE PF 10 MG/ML IJ SOLN
INTRAMUSCULAR | Status: DC | PRN
  Administered 2024-05-20: 10 mg via INTRAVENOUS

## 2024-05-20 MED ORDER — ACETAMINOPHEN 10 MG/ML IV SOLN: 1000.0000 mg | Freq: Once | INTRAVENOUS | Status: DC | PRN

## 2024-05-20 MED ORDER — ORAL CARE MOUTH RINSE: 15.0000 mL | Freq: Once | OROMUCOSAL | Status: AC

## 2024-05-20 MED ORDER — CEFAZOLIN SODIUM-DEXTROSE 2-4 GM/100ML-% IV SOLN
2.0000 g | INTRAVENOUS | Status: AC
Start: 2024-05-20 — End: 2024-05-20
  Administered 2024-05-20: 2 g via INTRAVENOUS
  Filled 2024-05-20: qty 100

## 2024-05-20 MED ORDER — FENTANYL CITRATE (PF) 100 MCG/2ML IJ SOLN
INTRAMUSCULAR | Status: DC | PRN
  Administered 2024-05-20: 50 ug via INTRAVENOUS

## 2024-05-20 MED ORDER — FENTANYL CITRATE (PF) 50 MCG/ML IJ SOSY: 25.0000 ug | PREFILLED_SYRINGE | INTRAMUSCULAR | Status: DC | PRN

## 2024-05-20 MED ORDER — FENTANYL CITRATE (PF) 100 MCG/2ML IJ SOLN
INTRAMUSCULAR | Status: AC
  Filled 2024-05-20: qty 2

## 2024-05-20 MED ORDER — OXYCODONE HCL 5 MG/5ML PO SOLN: 5.0000 mg | Freq: Once | ORAL | Status: DC | PRN

## 2024-05-20 MED ORDER — SODIUM CHLORIDE 0.9 % IV SOLN: INTRAVENOUS | Status: DC

## 2024-05-20 MED ORDER — LACTATED RINGERS IV SOLN
INTRAVENOUS | Status: DC
Start: 2024-05-20 — End: 2024-05-20

## 2024-05-20 MED ORDER — DROPERIDOL 2.5 MG/ML IJ SOLN
0.6250 mg | Freq: Once | INTRAMUSCULAR | Status: DC | PRN
Start: 2024-05-20 — End: 2024-05-20

## 2024-05-20 MED ORDER — SODIUM CHLORIDE 0.9 % IR SOLN
Status: DC | PRN
Start: 2024-05-20 — End: 2024-05-20
  Administered 2024-05-20: 3000 mL via INTRAVESICAL

## 2024-05-20 SURGICAL SUPPLY — 24 items
BAG URO CATCHER STRL LF (MISCELLANEOUS) ×1 IMPLANT
BASKET ZERO TIP NITINOL 2.4FR (BASKET) IMPLANT
BENZOIN TINCTURE PRP APPL 2/3 (GAUZE/BANDAGES/DRESSINGS) IMPLANT
CATH URETERAL DUAL LUMEN 10F (MISCELLANEOUS) IMPLANT
CATH URETL OPEN 5X70 (CATHETERS) ×1 IMPLANT
CLOTH BEACON ORANGE TIMEOUT ST (SAFETY) ×1 IMPLANT
DRSG TEGADERM 2-3/8X2-3/4 SM (GAUZE/BANDAGES/DRESSINGS) IMPLANT
FIBER LASER MOSES 200 DFL (Laser) IMPLANT
GLOVE BIOGEL M 7.0 STRL (GLOVE) ×1 IMPLANT
GOWN STRL REUS W/ TWL XL LVL3 (GOWN DISPOSABLE) ×1 IMPLANT
GUIDEWIRE STR DUAL SENSOR (WIRE) ×2 IMPLANT
GUIDEWIRE ZIPWRE .038 STRAIGHT (WIRE) IMPLANT
KIT TURNOVER KIT A (KITS) ×1 IMPLANT
MANIFOLD NEPTUNE II (INSTRUMENTS) ×1 IMPLANT
PACK CYSTO (CUSTOM PROCEDURE TRAY) ×1 IMPLANT
PAD PREP 24X48 CUFFED NSTRL (MISCELLANEOUS) ×1 IMPLANT
SHEATH DILATOR SET 8/10 (MISCELLANEOUS) IMPLANT
SHEATH NAVIGATOR HD 11/13X28 (SHEATH) IMPLANT
SHEATH NAVIGATOR HD 11/13X36 (SHEATH) IMPLANT
STENT URET 6FRX26 CONTOUR (STENTS) IMPLANT
SYRINGE TOOMEY IRRIG 70ML (MISCELLANEOUS) IMPLANT
TRACTIP FLEXIVA PULS ID 200XHI (Laser) IMPLANT
TUBING CONNECTING 10 (TUBING) ×1 IMPLANT
TUBING UROLOGY SET (TUBING) ×1 IMPLANT

## 2024-05-20 NOTE — H&P (Signed)
 1. Prostate nodule/elevated PSA: -Dx with right apex prostate nodule 01/2018 -S/p TRUS bx 03/2018 with BPH in all cores. TRUS vol 37cc. -S/p 2nd TRUS bx 11/2019 with 3 cores HGPIN. TRUS vol 42cc. -MRI prostate 09/2010 with PI-RADS 3 lesion in left anterior peripheral zone at apex. Volume 45cc. -Initiated finasteride  5mg  by Dr. Rosalind in 2022.  PSA trend: No FH of PCa. No prostatitis/UTI. 3.1 in 02/2019 4.28 in 09/2019 4.81 in 01/2020 6.63 in 1 08/2020 2.39 in 03/2021 1.93 in 09/2021 2.29 in 03/2022 3.51 in 10/2022 1.8 in 04/2023 (<4 when corrected for use of finasteride ) 1.78 in 10/2023 (<4 point corrected for use of finasteride ) 2.7 in 04/2024 (5 with finasteride )  Denies bone pain or weight loss.  2. BPH with LUTS: Prostate volume 45 cc by MRI in 09/2020. He has been on finasteride  since 2022. IPSS score 3, QOL of 1.  3. Erectile dysfunction: Dx 2022 with difficulty obtaining and maintaining. No nitroglycerin use. Uses tadalafil 20mg  prn with benefit.  4. Urolithiasis: CT A/P 03/2022 with 5 mm right renal pelvis stone as well as less than 5 mm right lower pole stone. Has had right-sided flank pain of the past 2 days in 04/2024. Denies fevers, chills, dysuria. - KUB 05/11/2024: Inability to visualize stone - CT 05/11/2024: Approximately 7 mm right proximal ureteral stone.  Patient currently denies fever, chills, sweats, nausea, vomiting, abdominal or flank pain, gross hematuria or dysuria.  1. Prostate nodule/elevated PSA: -PSA is 5 with finasteride . We discussed option including continue to trend PSA, proceeding with repeat TRUS biopsy or MRI fusion biopsy. This time, he would like to continue trending PSA. He understands risk of developing prostate cancer. He will follow-up in 6 months with repeat PSA, sooner if needed.  2. BPH/LUTS: Continue finasteride . Given small prostate size, can consider withholding with medication in future.  3. Erectile dysfunction: Continue sildenafil. Discussed  risk benefits.  4. Right renal stones: - KUB 05/11/2024: Inability to visualize stone. - CT 05/11/2024: Approximate 7 mm right ureteral stone proximal. - He elects proceed with ureteroscopy. Surgery letter submitted.  Urology Preoperative H&P   Chief Complaint: Right ureteral stone  History of Present Illness: Donald Vaughn is a 73 y.o. male with approximate 7 mm right proximal ureteral stone on CT is 05/11/2024.  He presents today for cystoscopy, right retrograde pyelogram, right ureteroscopy with laser lithotripsy and basket traction of stone.  He denies fevers, chills, dysuria.    Past Medical History:  Diagnosis Date   Alcohol abuse    hx   Anxiety    Arthritis    BCC (basal cell carcinoma of skin)    Benign prostatic hypertrophy    hx   BPH (benign prostatic hyperplasia)    BPH (benign prostatic hyperplasia)    Chest pain, atypical    Chronic kidney disease    Diverticulitis    Dysrhythmia    Palpitations   GERD (gastroesophageal reflux disease)    with history of esophagitis   Hearing loss    History of kidney stones    Hypercholesterolemia    Hypertension    Leukopenia    mild   Obese    Pneumonia    with COVID   Pre-diabetes    Rectal bleeding    Skin cancer    Tinnitus    chronic    Past Surgical History:  Procedure Laterality Date   BACK SURGERY     L4/5 Dr Madilyn   BILIARY DILATION  11/20/2022  Procedure: BILIARY DILATION;  Surgeon: Rosalie Kitchens, MD;  Location: [facility] ENDOSCOPY;  Service: Gastroenterology;;   CHOLECYSTECTOMY  11/07/2022   Procedure: LAPAROSCOPIC CHOLECYSTECTOMY WITH INTRAOPERATIVE CHOLANGIOGRAM;  Surgeon: Paola Dreama SAILOR, MD;  Location: [facility] OR;  Service: General;;   COLONOSCOPY  09/11/2001   norma. (Dr. Geroge)   EGD- normal  09/11/2001   Dr. Geroge.   ERCP N/A 11/20/2022   Procedure: ENDOSCOPIC RETROGRADE CHOLANGIOPANCREATOGRAPHY (ERCP);  Surgeon: Rosalie Kitchens, MD;  Location: [facility] ENDOSCOPY;  Service: Gastroenterology;  Laterality: N/A;    EYE SURGERY Bilateral    cataract extraction  w/ IOL   HERNIA REPAIR     umbilical with PVP   left foot surgery     R shoulder surgery  11/09/2001   Dr. Jane   REMOVAL OF STONES  11/20/2022   Procedure: REMOVAL OF STONES;  Surgeon: Rosalie Kitchens, MD;  Location: [facility] ENDOSCOPY;  Service: Gastroenterology;;   ANNETT  11/20/2022   Procedure: ANNETT;  Surgeon: Rosalie Kitchens, MD;  Location: [facility] ENDOSCOPY;  Service: Gastroenterology;;    Allergies:  Allergies  Allergen Reactions   Cats Claw [Uncaria Tomentosa (Cats Claw)]     allergy   Codeine     Stomach upset   Oxycodone -Aspirin     REACTION: NAUSEA AND VOMITING   Septra [Sulfamethoxazole-Trimethoprim]     rash   Sulfonamide Derivatives     REACTION: SWELLING    Family History  Problem Relation Age of Onset   Lymphoma Mother    Hypertension Mother    Hyperlipidemia Mother    Diabetes Mother    Alzheimer's disease Father     Social History:  reports that he has never smoked. He has never used smokeless tobacco. He reports that he does not drink alcohol and does not use drugs.  ROS: A complete review of systems was performed.  All systems are negative except for pertinent findings as noted.  Physical Exam:  Vital signs in last 24 hours:   Constitutional:  Alert and oriented, No acute distress Cardiovascular: Regular rate and rhythm Respiratory: Normal respiratory effort, Lungs clear bilaterally GI: Abdomen is soft, nontender, nondistended, no abdominal masses GU: No CVA tenderness Lymphatic: No lymphadenopathy Neurologic: Grossly intact, no focal deficits Psychiatric: Normal mood and affect  Laboratory Data:  No results for input(s): WBC, HGB, HCT, PLT in the last 72 hours.  No results for input(s): NA, K, CL, GLUCOSE, BUN, CALCIUM, CREATININE in the last 72 hours.  Invalid input(s): CO3   No results found for this or any previous visit (from the past 24 hours). No results  found for this or any previous visit (from the past 240 hours).  Renal Function: Recent Labs    05/17/24 0948  CREATININE 2.20*   Estimated Creatinine Clearance: 32.5 mL/min (A) (by C-G formula based on SCr of 2.2 mg/dL (H)).  Radiologic Imaging: No results found.  I independently reviewed the above imaging studies.  Assessment and Plan Donald Vaughn is a 73 y.o. male with right ureteral stone here for right ureteroscopy with lithotripsy and basket extraction stone.    Matt R. Tremayne Sheldon MD 05/20/2024, 10:04 AM  [facility] Pager: [phone]): [phone]

## 2024-05-20 NOTE — Anesthesia Procedure Notes (Signed)
 Procedure Name: LMA Insertion Date/Time: 05/20/2024 1:37 PM  Performed by: Carleton Garnette SAUNDERS, CRNAPre-anesthesia Checklist: Patient identified, Suction available, Emergency Drugs available, Patient being monitored and Timeout performed Patient Re-evaluated:Patient Re-evaluated prior to induction Oxygen Delivery Method: Circle system utilized Preoxygenation: Pre-oxygenation with 100% oxygen Induction Type: IV induction LMA: LMA inserted LMA Size: 5.0 Tube type: Oral Number of attempts: 1 Placement Confirmation: positive ETCO2 and breath sounds checked- equal and bilateral Tube secured with: Tape Dental Injury: Teeth and Oropharynx as per pre-operative assessment

## 2024-05-20 NOTE — Anesthesia Postprocedure Evaluation (Signed)
 Anesthesia Post Note  Patient: Donald Vaughn  Procedure(s) Performed: CYSTOSCOPY/URETEROSCOPY/HOLMIUM LASER/STENT PLACEMENT (Right: Ureter)     Patient location during evaluation: PACU Anesthesia Type: General Level of consciousness: awake and alert Pain management: pain level controlled Vital Signs Assessment: post-procedure vital signs reviewed and stable Respiratory status: spontaneous breathing, nonlabored ventilation, respiratory function stable and patient connected to nasal cannula oxygen Cardiovascular status: blood pressure returned to baseline and stable Postop Assessment: no apparent nausea or vomiting Anesthetic complications: no   No notable events documented.  Last Vitals:  Vitals:   05/20/24 1430 05/20/24 1445  BP: (!) 155/75 (!) 147/85  Pulse: 82 71  Resp: 13 11  Temp:  36.5 C  SpO2: 100% 99%    Last Pain:  Vitals:   05/20/24 1445  TempSrc:   PainSc: 0-No pain                 [name]

## 2024-05-20 NOTE — Op Note (Signed)
 Operative Note  Preoperative diagnosis:  1.  Right UPJ stone  Postoperative diagnosis: 1.  Right UPJ stone  Procedure(s): 1.  Cystoscopy 2.  Diagnostic right ureteroscopy 3.  Right retrograde pyelogram 4.  Right ureteral stent placement 5. Fluoroscopy with intraoperative interpretation  Surgeon: Donnice Siad, MD  Assistants:  None  Anesthesia:  General  Complications:  None  EBL:  Minimal  Specimens: 1. None  Drains/Catheters: 1.  Right 6Fr x 26cm ureteral stent without tether  Intraoperative findings:   Cystoscopy demonstrated mildly obstructing prostate, suspicious bladder lesions Right retrograde pyelogram with mild right hydronephrosis to the level of the right proximal ureter.  Unable to visualize calcification with fluoroscopy. Right ureteroscopy demonstrated very narrow distal right ureter only able to advance approximately 5 cm in the distal right ureter..  Attempted to advance both dual-lumen catheter and single-lumen ureteroscope with inability to bypassed this area. Successful right ureteral stent placement.  Indication:  Donald Vaughn is a 73 y.o. male with a history of a right UPJ stone here for right ureteroscopy with laser lithotripsy and basket extraction of stone.  Description of procedure: After informed consent was obtained from the patient, the patient was identified and taken to the operating room and placed in the supine position.  General anesthesia was administered as well as perioperative IV antibiotics.  At the beginning of the case, a time-out was performed to properly identify the patient, the surgery to be performed, and the surgical site.  Sequential compression devices were applied to the lower extremities at the beginning of the case for DVT prophylaxis.  The patient was then placed in the dorsal lithotomy supine position, prepped and draped in sterile fashion.  We then passed the 21-French rigid cystoscope through the urethra and into the  bladder under vision without any difficulty, noting a normal urethra without strictures and a mildly obstructing prostate.  A systematic evaluation of the bladder revealed no evidence of any suspicious bladder lesions.  Ureteral orifices were in normal position.    Under cystoscopic and flouroscopic guidance, we cannulated the right ureteral orifice with a 5-French open-ended ureteral catheter and a gentle retrograde pyelogram was performed, revealing a narrow caliber ureter without any filling defects.  There was mild hydronephrosis of the collecting system. A 0.038 sensor wire was then passed up to the level of the renal pelvis and secured to the drape as a safety wire. The ureteral catheter and cystoscope were removed, leaving the safety wire in place.   A semi-rigid ureteroscope was passed alongside the wire up the distal ureter which appeared narrow.  A second 0.038 sensor wire was passed under direct vision and the semirigid scope was removed.  I attempted to railroad between these wires with the semirigid scope and was able to [place] approximately 5 cm however no further.  I then passed a dual-lumen catheter over the wires however this would not bypassed this area.  I then attempted to pass a single-lumen flexible digital ureteroscope however the similarly met resistance in the distal ureter.  As such, elected to abort the case and place a stent to allow for passive dilation.  There was small amount of clot material present in the bladder that was evacuated.   Once the ureteroscope was removed, the Glidewire was backloaded through the rigid cystoscope, which was then advanced down the urethra and into the bladder. We then used the Glidewire under direct vision through the rigid cystoscope and under fluoroscopic guidance and passed up a 6-French, 26  cm double-pigtail ureteral stent up ureter, making sure that the proximal and distal ends coiled within the kidney and bladder respectively.  Of note, this  was difficult passing the stent given the narrow caliber of the ureter.  Ultimately, we visualized a curl within the renal pelvis and bladder respectively.  Urine was seen draining from the sideholes of the stent at the end of the case.  No tether string was left attached. We were able to see the distal stent coiling nicely within the bladder.  The bladder was then emptied with irrigation solution.  The cystoscope was then removed.    The patient tolerated the procedure well and there was no complication. Patient was awoken from anesthesia and taken to the recovery room in stable condition. I was present and scrubbed for the entirety of the case.  Plan:  Patient will be discharged home.  Will allow for passive dilation of his ureter with indwelling stent.  Will schedule for surgery after a few weeks of dilation.  Reviewed operative findings with his wife.  Matt R. Aki Burdin MD [facility]  Pager: [phone]

## 2024-05-20 NOTE — Discharge Instructions (Signed)
 [facility] [phone] Post Ureteroscopy With or Without Stent Instructions  Definitions:  Ureter: The duct that transports urine from the kidney to the bladder. Stent:   A plastic hollow tube that is placed into the ureter, from the kidney to the bladder to prevent the ureter from swelling shut.  GENERAL INSTRUCTIONS:  Despite the fact that no skin incisions were used, the area around the ureter and bladder is raw and irritated. The stent is a foreign body which will further irritate the bladder wall. This irritation is manifested by increased frequency of urination, both day and night, and by an increase in the urge to urinate. In some, the urge to urinate is present almost always. Sometimes the urge is strong enough that you may not be able to stop yourself from urinating. The only real cure is to remove the stent and then give time for the bladder wall to heal which can't be done until the danger of the ureter swelling shut has passed, which varies.  You may see some blood in your urine while the stent is in place and a few days afterwards. Do not be alarmed, even if the urine was clear for a while. Get off your feet and drink lots of fluids until clearing occurs. If you start to pass clots or don't improve, call us .  DIET: You may return to your normal diet immediately. Because of the raw surface of your bladder, alcohol, spicy foods, acid type foods and drinks with caffeine may cause irritation or frequency and should be used in moderation. To keep your urine flowing freely and to avoid constipation, drink plenty of fluids during the day ( 8-10 glasses ). Tip: Avoid cranberry juice because it is very acidic.  ACTIVITY: Your physical activity doesn't need to be restricted. However, if you are very active, you may see some blood in your urine. We suggest that you reduce your activity under these circumstances until the bleeding has stopped.  BOWELS: It is important to  keep your bowels regular during the postoperative period. Straining with bowel movements can cause bleeding. A bowel movement every other day is reasonable. Use a mild laxative if needed, such as Milk of Magnesia 2-3 tablespoons, or 2 Dulcolax tablets. Call if you continue to have problems. If you have been taking narcotics for pain, before, during or after your surgery, you may be constipated. Take a laxative if necessary.   MEDICATION: You should resume your pre-surgery medications unless told not to. In addition you will often be given an antibiotic to prevent infection. These should be taken as prescribed until the bottles are finished unless you are having an unusual reaction to one of the drugs.  PROBLEMS YOU SHOULD REPORT TO US : Fevers over 100.5 Fahrenheit. Heavy bleeding, or clots ( See above notes about blood in urine ). Inability to urinate. Drug reactions ( hives, rash, nausea, vomiting, diarrhea ). Severe burning or pain with urination that is not improving.  FOLLOW-UP: You will need a follow-up appointment to monitor your progress. Call for this appointment at the number listed above. Usually the first appointment will be about three to fourteen days after your surgery.   You have a stent in place draining your kidney and this will remain until next surgery

## 2024-05-20 NOTE — Transfer of Care (Signed)
 Immediate Anesthesia Transfer of Care Note  Patient: Donald Vaughn  Procedure(s) Performed: CYSTOSCOPY/URETEROSCOPY/HOLMIUM LASER/STENT PLACEMENT (Right: Ureter)  Patient Location: PACU  Anesthesia Type:General  Level of Consciousness: sedated  Airway & Oxygen Therapy: Patient Spontanous Breathing and Patient connected to face mask oxygen  Post-op Assessment: Report given to RN and Post -op Vital signs reviewed and stable  Post vital signs: Reviewed and stable  Last Vitals:  Vitals Value Taken Time  BP    Temp    Pulse 72 05/20/24 14:22  Resp 10 05/20/24 14:22  SpO2 100 % 05/20/24 14:22  Vitals shown include unfiled device data.  Last Pain:  Vitals:   05/20/24 1113  TempSrc:   PainSc: 0-No pain         Complications: No notable events documented.

## 2024-05-21 ENCOUNTER — Encounter (HOSPITAL_COMMUNITY): Payer: Self-pay | Admitting: Urology

## 2024-06-01 ENCOUNTER — Other Ambulatory Visit: Payer: Self-pay | Admitting: Urology

## 2024-06-02 ENCOUNTER — Encounter (HOSPITAL_COMMUNITY): Payer: Self-pay | Admitting: Urology

## 2024-06-02 NOTE — Progress Notes (Signed)
 Patient phoned to give updated information on surgery.  Second stage surgery  Date of Surgery - 06-06-24  Arrival Time - 1:00 PM and check in at admitting.    NPO Status - patient reminded to not eat solid food or drink liquids after midnight.    Medications morning of surgery - Atorvastatin, Zyrtec, Tamsulosin.  Oxycodone  if needed  No change in medical history, allergies per patient since last surgery.  Transportation home - [facility] [phone]  All questions answered and patient stated understanding

## 2024-06-06 ENCOUNTER — Other Ambulatory Visit: Payer: Self-pay

## 2024-06-06 ENCOUNTER — Ambulatory Visit (HOSPITAL_COMMUNITY): Admitting: Anesthesiology

## 2024-06-06 ENCOUNTER — Ambulatory Visit (HOSPITAL_COMMUNITY)

## 2024-06-06 ENCOUNTER — Encounter (HOSPITAL_COMMUNITY): Payer: Self-pay | Admitting: Urology

## 2024-06-06 ENCOUNTER — Ambulatory Visit (HOSPITAL_COMMUNITY)
Admission: RE | Admit: 2024-06-06 | Discharge: 2024-06-06 | Disposition: A | Discharge status: Home / Self Care | Attending: Urology | Admitting: Urology

## 2024-06-06 ENCOUNTER — Encounter (HOSPITAL_COMMUNITY)
Admission: RE | Disposition: A | Discharge status: Home / Self Care | Payer: Self-pay | Source: Home / Self Care | Attending: Urology

## 2024-06-06 DIAGNOSIS — F419 Anxiety disorder, unspecified: Secondary | ICD-10-CM

## 2024-06-06 DIAGNOSIS — E785 Hyperlipidemia, unspecified: Secondary | ICD-10-CM | POA: Diagnosis not present

## 2024-06-06 DIAGNOSIS — N201 Calculus of ureter: Secondary | ICD-10-CM

## 2024-06-06 DIAGNOSIS — I129 Hypertensive chronic kidney disease with stage 1 through stage 4 chronic kidney disease, or unspecified chronic kidney disease: Secondary | ICD-10-CM | POA: Diagnosis not present

## 2024-06-06 DIAGNOSIS — N189 Chronic kidney disease, unspecified: Secondary | ICD-10-CM | POA: Diagnosis not present

## 2024-06-06 DIAGNOSIS — I10 Essential (primary) hypertension: Secondary | ICD-10-CM

## 2024-06-06 DIAGNOSIS — K219 Gastro-esophageal reflux disease without esophagitis: Secondary | ICD-10-CM | POA: Diagnosis not present

## 2024-06-06 DIAGNOSIS — D649 Anemia, unspecified: Secondary | ICD-10-CM

## 2024-06-06 DIAGNOSIS — E7800 Pure hypercholesterolemia, unspecified: Secondary | ICD-10-CM | POA: Insufficient documentation

## 2024-06-06 DIAGNOSIS — K769 Liver disease, unspecified: Secondary | ICD-10-CM

## 2024-06-06 DIAGNOSIS — E669 Obesity, unspecified: Secondary | ICD-10-CM | POA: Insufficient documentation

## 2024-06-06 DIAGNOSIS — N202 Calculus of kidney with calculus of ureter: Secondary | ICD-10-CM | POA: Insufficient documentation

## 2024-06-06 DIAGNOSIS — Z79899 Other long term (current) drug therapy: Secondary | ICD-10-CM | POA: Insufficient documentation

## 2024-06-06 HISTORY — PX: CYSTOSCOPY/URETEROSCOPY/HOLMIUM LASER/STENT PLACEMENT: SHX6546

## 2024-06-06 HISTORY — DX: Anemia, unspecified: D64.9

## 2024-06-06 LAB — COMPREHENSIVE METABOLIC PANEL WITH GFR
ALT: 15 U/L (ref 0–44)
AST: 26 U/L (ref 15–41)
Albumin: 3.9 g/dL (ref 3.5–5.0)
Alkaline Phosphatase: 134 U/L — ABNORMAL HIGH (ref 38–126)
Anion gap: 9 (ref 5–15)
BUN: 37 mg/dL — ABNORMAL HIGH (ref 8–23)
CO2: 24 mmol/L (ref 22–32)
Calcium: 9.1 mg/dL (ref 8.9–10.3)
Chloride: 107 mmol/L (ref 98–111)
Creatinine, Ser: 1.97 mg/dL — ABNORMAL HIGH (ref 0.61–1.24)
GFR, Estimated: 35 mL/min — ABNORMAL LOW (ref >60)
Glucose, Bld: 102 mg/dL — ABNORMAL HIGH (ref 70–99)
Potassium: 4.6 mmol/L (ref 3.5–5.1)
Sodium: 139 mmol/L (ref 135–145)
Total Bilirubin: 0.7 mg/dL (ref 0.0–1.2)
Total Protein: 6.9 g/dL (ref 6.5–8.1)

## 2024-06-06 LAB — CBC
HCT: 47.1 % (ref 39.0–52.0)
Hemoglobin: 15.1 g/dL (ref 13.0–17.0)
MCH: 30.8 pg (ref 26.0–34.0)
MCHC: 32.1 g/dL (ref 30.0–36.0)
MCV: 95.9 fL (ref 80.0–100.0)
Platelets: 148 K/uL — ABNORMAL LOW (ref 150–400)
RBC: 4.91 MIL/uL (ref 4.22–5.81)
RDW: 12.9 % (ref 11.5–15.5)
WBC: 5.2 K/uL (ref 4.0–10.5)
nRBC: 0 % (ref 0.0–0.2)

## 2024-06-06 SURGERY — CYSTOSCOPY/URETEROSCOPY/HOLMIUM LASER/STENT PLACEMENT
Anesthesia: General | Site: Ureter | Laterality: Right

## 2024-06-06 MED ORDER — ONDANSETRON HCL 4 MG/2ML IJ SOLN: 4.0000 mg | Freq: Once | INTRAMUSCULAR | Status: DC | PRN

## 2024-06-06 MED ORDER — OXYCODONE-ACETAMINOPHEN 5-325 MG PO TABS: 1.0000 | ORAL_TABLET | Freq: Four times a day (QID) | ORAL | 0 refills | Status: AC | PRN

## 2024-06-06 MED ORDER — LIDOCAINE HCL (CARDIAC) PF 100 MG/5ML IV SOSY
PREFILLED_SYRINGE | INTRAVENOUS | Status: DC | PRN
  Administered 2024-06-06: 60 mg via INTRAVENOUS

## 2024-06-06 MED ORDER — ONDANSETRON HCL 4 MG/2ML IJ SOLN
INTRAMUSCULAR | Status: AC
  Filled 2024-06-06: qty 2

## 2024-06-06 MED ORDER — ONDANSETRON HCL 4 MG/2ML IJ SOLN
INTRAMUSCULAR | Status: DC | PRN
  Administered 2024-06-06: 4 mg via INTRAVENOUS

## 2024-06-06 MED ORDER — FENTANYL CITRATE (PF) 100 MCG/2ML IJ SOLN
INTRAMUSCULAR | Status: DC | PRN
  Administered 2024-06-06 (×2): 25 ug via INTRAVENOUS
  Administered 2024-06-06: 50 ug via INTRAVENOUS

## 2024-06-06 MED ORDER — PROPOFOL 10 MG/ML IV BOLUS
INTRAVENOUS | Status: AC
  Filled 2024-06-06: qty 20

## 2024-06-06 MED ORDER — ORAL CARE MOUTH RINSE: 15.0000 mL | Freq: Once | OROMUCOSAL | Status: AC

## 2024-06-06 MED ORDER — CEFAZOLIN SODIUM-DEXTROSE 2-4 GM/100ML-% IV SOLN
2.0000 g | INTRAVENOUS | Status: AC
  Administered 2024-06-06: 2 g via INTRAVENOUS
  Filled 2024-06-06: qty 100

## 2024-06-06 MED ORDER — FENTANYL CITRATE (PF) 100 MCG/2ML IJ SOLN
INTRAMUSCULAR | Status: AC
  Filled 2024-06-06: qty 2

## 2024-06-06 MED ORDER — LACTATED RINGERS IV SOLN
INTRAVENOUS | Status: DC
Start: 2024-06-06 — End: 2024-06-07

## 2024-06-06 MED ORDER — AMISULPRIDE (ANTIEMETIC) 5 MG/2ML IV SOLN: 10.0000 mg | Freq: Once | INTRAVENOUS | Status: DC | PRN

## 2024-06-06 MED ORDER — CHLORHEXIDINE GLUCONATE 0.12 % MT SOLN
15.0000 mL | Freq: Once | OROMUCOSAL | Status: AC
  Administered 2024-06-06: 15 mL via OROMUCOSAL

## 2024-06-06 MED ORDER — HYDROCODONE-ACETAMINOPHEN 7.5-325 MG PO TABS: 1.0000 | ORAL_TABLET | Freq: Once | ORAL | Status: DC | PRN

## 2024-06-06 MED ORDER — DEXAMETHASONE SOD PHOSPHATE PF 10 MG/ML IJ SOLN
INTRAMUSCULAR | Status: DC | PRN
  Administered 2024-06-06: 5 mg via INTRAVENOUS

## 2024-06-06 MED ORDER — SODIUM CHLORIDE 0.9 % IR SOLN
Status: DC | PRN
  Administered 2024-06-06: 3000 mL

## 2024-06-06 MED ORDER — HYDROMORPHONE HCL 1 MG/ML IJ SOLN: 0.2500 mg | INTRAMUSCULAR | Status: DC | PRN

## 2024-06-06 SURGICAL SUPPLY — 23 items
BAG URO CATCHER STRL LF (MISCELLANEOUS) ×1 IMPLANT
BASKET ZERO TIP NITINOL 2.4FR (BASKET) IMPLANT
BENZOIN TINCTURE PRP APPL 2/3 (GAUZE/BANDAGES/DRESSINGS) IMPLANT
CATH URETERAL DUAL LUMEN 10F (MISCELLANEOUS) IMPLANT
CATH URETL OPEN END 6FR 70 (CATHETERS) IMPLANT
CLOTH BEACON ORANGE TIMEOUT ST (SAFETY) ×1 IMPLANT
DRSG TEGADERM 2-3/8X2-3/4 SM (GAUZE/BANDAGES/DRESSINGS) IMPLANT
FIBER LASER MOSES 200 DFL (Laser) IMPLANT
GLOVE BIOGEL M 7.0 STRL (GLOVE) ×1 IMPLANT
GOWN STRL REUS W/ TWL XL LVL3 (GOWN DISPOSABLE) ×1 IMPLANT
GUIDEWIRE STR DUAL SENSOR (WIRE) ×2 IMPLANT
GUIDEWIRE ZIPWRE .038 STRAIGHT (WIRE) IMPLANT
KIT TURNOVER KIT A (KITS) ×1 IMPLANT
MANIFOLD NEPTUNE II (INSTRUMENTS) ×1 IMPLANT
PACK CYSTO (CUSTOM PROCEDURE TRAY) ×1 IMPLANT
PAD PREP 24X48 CUFFED NSTRL (MISCELLANEOUS) ×1 IMPLANT
SHEATH DILATOR SET 8/10 (MISCELLANEOUS) IMPLANT
SHEATH NAVIGATOR HD 11/13X28 (SHEATH) IMPLANT
SHEATH NAVIGATOR HD 11/13X36 (SHEATH) IMPLANT
STENT URET 6FRX26 CONTOUR (STENTS) IMPLANT
TRACTIP FLEXIVA PULS ID 200XHI (Laser) IMPLANT
TUBING CONNECTING 10 (TUBING) ×1 IMPLANT
TUBING UROLOGY SET (TUBING) ×1 IMPLANT

## 2024-06-06 NOTE — Anesthesia Procedure Notes (Signed)
 Procedure Name: LMA Insertion Date/Time: 06/06/2024 4:45 PM  Performed by: Roslynn Waddell LABOR, CRNAPre-anesthesia Checklist: Patient identified, Emergency Drugs available, Suction available and Patient being monitored Patient Re-evaluated:Patient Re-evaluated prior to induction Oxygen Delivery Method: Circle System Utilized Preoxygenation: Pre-oxygenation with 100% oxygen Induction Type: IV induction Ventilation: Mask ventilation without difficulty LMA: LMA inserted LMA Size: 4.0 Number of attempts: 1 Airway Equipment and Method: Bite block Placement Confirmation: positive ETCO2 Tube secured with: Tape Dental Injury: Teeth and Oropharynx as per pre-operative assessment

## 2024-06-06 NOTE — Discharge Instructions (Addendum)
 [facility] [phone] Post Ureteroscopy With or Without Stent Instructions  Definitions:  Ureter: The duct that transports urine from the kidney to the bladder. Stent:   A plastic hollow tube that is placed into the ureter, from the kidney to the bladder to prevent the ureter from swelling shut.  GENERAL INSTRUCTIONS:  Despite the fact that no skin incisions were used, the area around the ureter and bladder is raw and irritated. The stent is a foreign body which will further irritate the bladder wall. This irritation is manifested by increased frequency of urination, both day and night, and by an increase in the urge to urinate. In some, the urge to urinate is present almost always. Sometimes the urge is strong enough that you may not be able to stop yourself from urinating. The only real cure is to remove the stent and then give time for the bladder wall to heal which can't be done until the danger of the ureter swelling shut has passed, which varies.  You may see some blood in your urine while the stent is in place and a few days afterwards. Do not be alarmed, even if the urine was clear for a while. Get off your feet and drink lots of fluids until clearing occurs. If you start to pass clots or don't improve, call us .  DIET: You may return to your normal diet immediately. Because of the raw surface of your bladder, alcohol, spicy foods, acid type foods and drinks with caffeine may cause irritation or frequency and should be used in moderation. To keep your urine flowing freely and to avoid constipation, drink plenty of fluids during the day ( 8-10 glasses ). Tip: Avoid cranberry juice because it is very acidic.  ACTIVITY: Your physical activity doesn't need to be restricted. However, if you are very active, you may see some blood in your urine. We suggest that you reduce your activity under these circumstances until the bleeding has stopped.  BOWELS: It is important to  keep your bowels regular during the postoperative period. Straining with bowel movements can cause bleeding. A bowel movement every other day is reasonable. Use a mild laxative if needed, such as Milk of Magnesia 2-3 tablespoons, or 2 Dulcolax tablets. Call if you continue to have problems. If you have been taking narcotics for pain, before, during or after your surgery, you may be constipated. Take a laxative if necessary.   MEDICATION: You should resume your pre-surgery medications unless told not to. In addition you will often be given an antibiotic to prevent infection. These should be taken as prescribed until the bottles are finished unless you are having an unusual reaction to one of the drugs.  PROBLEMS YOU SHOULD REPORT TO US : Fevers over 100.5 Fahrenheit. Heavy bleeding, or clots ( See above notes about blood in urine ). Inability to urinate. Drug reactions ( hives, rash, nausea, vomiting, diarrhea ). Severe burning or pain with urination that is not improving.  FOLLOW-UP: You will need a follow-up appointment to monitor your progress. Call for this appointment at the number listed above. Usually the first appointment will be about three to fourteen days after your surgery.  Remove stent by pulling on string on Thursday.

## 2024-06-06 NOTE — H&P (Signed)
 Urology Preoperative H&P   Chief Complaint: Right ureteral and renal stones  History of Present Illness: Donald Vaughn is a 73 y.o. male with a right ureteral and renal stones here for right ureteroscopy with laser lithotripsy and basket extraction of stones. Denies fevers, chills, dysuria.   1. Prostate nodule/elevated PSA: -Dx with right apex prostate nodule 01/2018 -S/p TRUS bx 03/2018 with BPH in all cores. TRUS vol 37cc. -S/p 2nd TRUS bx 11/2019 with 3 cores HGPIN. TRUS vol 42cc. -MRI prostate 09/2010 with PI-RADS 3 lesion in left anterior peripheral zone at apex. Volume 45cc. -Initiated finasteride  5mg  by Dr. Rosalind in 2022.  PSA trend: No FH of PCa. No prostatitis/UTI. 3.1 in 02/2019 4.28 in 09/2019 4.81 in 01/2020 6.63 in 1 08/2020 2.39 in 03/2021 1.93 in 09/2021 2.29 in 03/2022 3.51 in 10/2022 1.8 in 04/2023 (<4 when corrected for use of finasteride ) 1.78 in 10/2023 (<4 point corrected for use of finasteride ) 2.7 in 04/2024 (5 with finasteride )  Denies bone pain or weight loss.  2. BPH with LUTS: Prostate volume 45 cc by MRI in 09/2020. He has been on finasteride  since 2022. IPSS score 3, QOL of 1.  3. Erectile dysfunction: Dx 2022 with difficulty obtaining and maintaining. No nitroglycerin use. Uses tadalafil 20mg  prn with benefit.  4. Urolithiasis: CT A/P 03/2022 with 5 mm right renal pelvis stone as well as less than 5 mm right lower pole stone. Has had right-sided flank pain of the past 2 days in 04/2024. Denies fevers, chills, dysuria. - KUB 05/11/2024: Inability to visualize stone - CT 05/11/2024: Approximately 7 mm right proximal ureteral stone. -S/p dx R URS 05/20/2024 with inability to advance beyond very narrow distal right ureter. Unable to advance with single-lumen ureteroscope. Difficult right stent placement given narrow caliber ureter.  Patient currently denies fever, chills, sweats, nausea, vomiting, abdominal or flank pain, gross hematuria or dysuria.  Past Medical  History:  Diagnosis Date   Alcohol abuse    hx   Anemia    Anxiety    Arthritis    BCC (basal cell carcinoma of skin)    Benign prostatic hypertrophy    hx   BPH (benign prostatic hyperplasia)    BPH (benign prostatic hyperplasia)    Chest pain, atypical    Chronic kidney disease    Diverticulitis    Dysrhythmia    Palpitations   GERD (gastroesophageal reflux disease)    with history of esophagitis   Hearing loss    History of kidney stones    Hypercholesterolemia    Hypertension    Leukopenia    mild   Obese    Pneumonia    with COVID   Pre-diabetes    Rectal bleeding    Skin cancer    Tinnitus    chronic    Past Surgical History:  Procedure Laterality Date   BACK SURGERY     L4/5 Dr Madilyn   BILIARY DILATION  11/20/2022   Procedure: BILIARY DILATION;  Surgeon: Rosalie Kitchens, MD;  Location: [facility] ENDOSCOPY;  Service: Gastroenterology;;   SALVADOR  11/07/2022   Procedure: LAPAROSCOPIC CHOLECYSTECTOMY WITH INTRAOPERATIVE CHOLANGIOGRAM;  Surgeon: Paola Dreama SAILOR, MD;  Location: [facility] OR;  Service: General;;   COLONOSCOPY  09/11/2001   norma. (Dr. Geroge)   CYSTOSCOPY/URETEROSCOPY/HOLMIUM LASER/STENT PLACEMENT Right 05/20/2024   Procedure: CYSTOSCOPY/URETEROSCOPY/HOLMIUM LASER/STENT PLACEMENT;  Surgeon: Selma Donnice SAUNDERS, MD;  Location: [facility] ORS;  Service: Urology;  Laterality: Right;  cystoscopy; diagnostic right ureteroscopy; right ureteral stent placement  EGD- normal  09/11/2001   Dr. Geroge.   ERCP N/A 11/20/2022   Procedure: ENDOSCOPIC RETROGRADE CHOLANGIOPANCREATOGRAPHY (ERCP);  Surgeon: Rosalie Kitchens, MD;  Location: [facility] ENDOSCOPY;  Service: Gastroenterology;  Laterality: N/A;   EYE SURGERY Bilateral    cataract extraction  w/ IOL   HERNIA REPAIR     umbilical with PVP   left foot surgery     R shoulder surgery  11/09/2001   Dr. Jane   REMOVAL OF STONES  11/20/2022   Procedure: REMOVAL OF STONES;  Surgeon: Rosalie Kitchens, MD;  Location: [facility] ENDOSCOPY;  Service:  Gastroenterology;;   ANNETT  11/20/2022   Procedure: ANNETT;  Surgeon: Rosalie Kitchens, MD;  Location: [facility] ENDOSCOPY;  Service: Gastroenterology;;    Allergies:  Allergies  Allergen Reactions   Cats Claw [Uncaria Tomentosa (Cats Claw)]     allergy   Codeine     Stomach upset   Oxycodone -Aspirin     REACTION: NAUSEA AND VOMITING   Septra [Sulfamethoxazole-Trimethoprim]     rash   Sulfonamide Derivatives     REACTION: SWELLING    Family History  Problem Relation Age of Onset   Lymphoma Mother    Hypertension Mother    Hyperlipidemia Mother    Diabetes Mother    Alzheimer's disease Father     Social History:  reports that he has never smoked. He has never used smokeless tobacco. He reports that he does not drink alcohol and does not use drugs.  ROS: A complete review of systems was performed.  All systems are negative except for pertinent findings as noted.  Physical Exam:  Vital signs in last 24 hours:   Constitutional:  Alert and oriented, No acute distress Cardiovascular: Regular rate and rhythm Respiratory: Normal respiratory effort, Lungs clear bilaterally GI: Abdomen is soft, nontender, nondistended, no abdominal masses GU: No CVA tenderness Lymphatic: No lymphadenopathy Neurologic: Grossly intact, no focal deficits Psychiatric: Normal mood and affect  Laboratory Data:  No results for input(s): WBC, HGB, HCT, PLT in the last 72 hours.  No results for input(s): NA, K, CL, GLUCOSE, BUN, CALCIUM, CREATININE in the last 72 hours.  Invalid input(s): CO3   No results found for this or any previous visit (from the past 24 hours). No results found for this or any previous visit (from the past 240 hours).  Renal Function: No results for input(s): CREATININE in the last 168 hours. CrCl cannot be calculated (Unknown ideal weight.).  Radiologic Imaging: No results found.  I independently reviewed the above imaging  studies.  Assessment and Plan Donald Vaughn is a 73 y.o. male with right ureteral and renal stones.  - Unable to access stone with single lumen ureteroscope on 10/12. He presents today for R URS/LL after stent passive dilation.  -The risks, benefits and alternatives of cystoscopy with right URS/LL, right JJ stent placement was discussed with the patient.  Risks include, but are not limited to: bleeding, urinary tract infection, ureteral injury, ureteral stricture disease, chronic pain, urinary symptoms, bladder injury, stent migration, the need for nephrostomy tube placement, MI, CVA, DVT, PE and the inherent risks with general anesthesia.  The patient voices understanding and wishes to proceed.   Matt R. Mazzie Brodrick MD 06/06/2024, 1:19 PM  [facility] Pager: [phone]

## 2024-06-06 NOTE — Op Note (Signed)
 Operative Note  Preoperative diagnosis:  1.  Right UPJ stone  Postoperative diagnosis: 1.  Right UPJ stone  Procedure(s): 1.  Cystoscopy 2. Right ureteroscopy with laser lithotripsy and basket extraction of stones 3. Right retrograde pyelogram 4. Right ureteral stent exchange 5. Fluoroscopy with intraoperative interpretation  Surgeon: Donnice Siad, MD  Assistants:  None  Anesthesia:  General  Complications:  None  EBL:  Minimal  Specimens: 1. Stones for stone analysis  Drains/Catheters: 1.  Right 6Fr x 26cm ureteral stent with tether string  Intraoperative findings:   Cystoscopy demonstrated mildly obstructing prostate, no suspicious bladder lesions Right retrograde pyelogram with no hydronephrosis. Right ureteroscopy with treatment right right UPJ and renal stones. Successful right ureteral stent exchange.  Indication:  Donald Vaughn is a 73 y.o. male with a history of a right UPJ stone here for right ureteroscopy with laser lithotripsy and basket extraction of stone. I was unable to pass a single lumen ureteroscopy beyond the right distal ureter and he has had 2 weeks of passive ureteral dilation with stent placement.  Description of procedure: After informed consent was obtained from the patient, the patient was identified and taken to the operating room and placed in the supine position.  General anesthesia was administered as well as perioperative IV antibiotics.  At the beginning of the case, a time-out was performed to properly identify the patient, the surgery to be performed, and the surgical site.  Sequential compression devices were applied to the lower extremities at the beginning of the case for DVT prophylaxis.  The patient was then placed in the dorsal lithotomy supine position, prepped and draped in sterile fashion.  We then passed the 21-French rigid cystoscope through the urethra and into the bladder under vision without any difficulty, noting a normal  urethra without strictures and a mildly obstructing prostate.  A systematic evaluation of the bladder revealed no evidence of any suspicious bladder lesions.  Ureteral orifices were in normal position.    The distal aspect of the ureteral stent was seen protruding from the right ureteral orifice.  We then used the alligator-tooth forceps and grasped the distal end of the ureteral stent and brought it out the urethral meatus while watching the proximal coil straighten out nicely on fluoroscopy. Through the ureteral stent, we then passed a 0.038 sensor wire up to the level of the renal pelvis.  The ureteral stent was then removed, leaving the sensor wire up the right ureter.    Under cystoscopic and flouroscopic guidance, we cannulated the right ureteral orifice with a 5-French open-ended ureteral catheter and a gentle retrograde pyelogram was performed, revealing a normal caliber ureter without any filling defects. There was no hydronephrosis of the collecting system.  A 0.038 sensor wire was then passed up to the level of the renal pelvis and secured to the drape as a safety wire. The ureteral catheter and cystoscope were removed, leaving the safety wire in place.   A semi-rigid ureteroscope was passed alongside the wire up the distal ureter which appeared normal. A second 0.038 sensor wire was passed under direct vision and the semirigid scope was removed. The flexible ureteroscope was advanced into the collecting system via the access sheath. The collecting system was inspected. The calculus was identified at the renal pelvis. Using the 272 micron holmium laser fiber, the stone was dusted 80% and fragmented into 2 small stones. A separate renal stone was similarly dusted and fragment. A 2.2 Fr zero tip basket was used to  remove the fragments under visual guidance. These were sent for chemical analysis. With the ureteroscope in the kidney, a gentle pyelogram was performed to delineate the calyceal system and  we evaluated the calyces systematically. We encountered no larger fragments. The rest of the stone fragments were very tiny and these were  irrigated away gently. The calyces were re-inspected and there were no significant stone fragment residual.   We then withdrew the ureteroscope back down the ureter.  Prior to removing the ureteroscope, we did pass the Glidewire back up to the ureter to the renal pelvis.     Once the ureteroscope was removed, the Glidewire was backloaded through the rigid cystoscope, which was then advanced down the urethra and into the bladder. We then used the Glidewire under direct vision through the rigid cystoscope and under fluoroscopic guidance and passed up a 6-French, 26 cm double-pigtail ureteral stent up ureter, making sure that the proximal and distal ends coiled within the kidney and bladder respectively.  Note that we left a long tether string attached to the distal end of the ureteral stent and it exited the urethral meatus and was secured to the penile shaft.  The cystoscope was then advanced back into the bladder under vision.  We were able to see the distal stent coiling nicely within the bladder.  The bladder was then emptied with irrigation solution.  The cystoscope was then removed.    The patient tolerated the procedure well and there was no complication. Patient was awoken from anesthesia and taken to the recovery room in stable condition. I was present and scrubbed for the entirety of the case.  Plan:  Patient will be discharged home.   Matt R. Kian Gamarra MD [facility]  Pager: [phone]

## 2024-06-06 NOTE — Transfer of Care (Signed)
 Immediate Anesthesia Transfer of Care Note  Patient: Donald Vaughn  Procedure(s) Performed: CYSTOSCOPY/URETEROSCOPY/HOLMIUM LASER/STENT PLACEMENT (Right: Ureter)  Patient Location: PACU  Anesthesia Type:General  Level of Consciousness: awake, alert , and oriented  Airway & Oxygen Therapy: Patient Spontanous Breathing  Post-op Assessment: Report given to RN and Post -op Vital signs reviewed and stable  Post vital signs: Reviewed and stable  Last Vitals:  Vitals Value Taken Time  BP 160/79 06/06/24 18:06  Temp 36.3 C 06/06/24 18:06  Pulse 75 06/06/24 18:13  Resp 24 06/06/24 18:13  SpO2 100 % 06/06/24 18:13  Vitals shown include unfiled device data.  Last Pain:  Vitals:   06/06/24 1806  TempSrc:   PainSc: 0-No pain         Complications: No notable events documented.

## 2024-06-06 NOTE — Anesthesia Preprocedure Evaluation (Addendum)
 Anesthesia Evaluation  Patient identified by MRN, date of birth, ID band Patient awake    Reviewed: Allergy & Precautions, NPO status , Patient's Chart, lab work & pertinent test results, reviewed documented beta blocker date and time   Airway Mallampati: II  TM Distance: >3 FB     Dental no notable dental hx. (+) Teeth Intact, Caps, Dental Advisory Given   Pulmonary pneumonia, resolved   Pulmonary exam normal breath sounds clear to auscultation       Cardiovascular hypertension, Pt. on medications Normal cardiovascular exam(-) dysrhythmias  Rhythm:Regular Rate:Normal  EKG 05/17/24 NSR, Normal   Neuro/Psych  PSYCHIATRIC DISORDERS Anxiety     negative neurological ROS     GI/Hepatic Neg liver ROS,GERD  Medicated,,  Endo/Other  Obesity HLD  Renal/GU Renal InsufficiencyRenal diseaseRight ureteral calculus Lab Results      Component                Value               Date                      NA                       139                 06/06/2024                CL                       107                 06/06/2024                K                        4.6                 06/06/2024                CO2                      24                  06/06/2024                BUN                      37 (H)              06/06/2024                CREATININE               1.97 (H)            06/06/2024                GFRNONAA                 35 (L)              06/06/2024                CALCIUM                  9.1  06/06/2024                ALBUMIN                   3.9                 06/06/2024                GLUCOSE                  102 (H)             06/06/2024              BPH negative genitourinary   Musculoskeletal  (+) Arthritis , Osteoarthritis,    Abdominal  (+) + obese  Peds  Hematology  (+) Blood dyscrasia, anemia Thrombocytopenia   Anesthesia Other Findings   Reproductive/Obstetrics                               Anesthesia Physical Anesthesia Plan  ASA: 3  Anesthesia Plan: General   Post-op Pain Management: Minimal or no pain anticipated, Dilaudid  IV and Ofirmev  IV (intra-op)*   Induction:   PONV Risk Score and Plan: 4 or greater and Treatment may vary due to age or medical condition, Propofol  infusion, Ondansetron  and Dexamethasone   Airway Management Planned: LMA  Additional Equipment: None  Intra-op Plan:   Post-operative Plan: Extubation in OR  Informed Consent: I have reviewed the patients History and Physical, chart, labs and discussed the procedure including the risks, benefits and alternatives for the proposed anesthesia with the patient or authorized representative who has indicated his/her understanding and acceptance.     Dental advisory given  Plan Discussed with: Anesthesiologist and CRNA  Anesthesia Plan Comments:          Anesthesia Quick Evaluation

## 2024-06-07 ENCOUNTER — Encounter (HOSPITAL_COMMUNITY): Payer: Self-pay | Admitting: Urology

## 2024-06-08 NOTE — Anesthesia Postprocedure Evaluation (Signed)
 Anesthesia Post Note  Patient: Donald Vaughn  Procedure(s) Performed: CYSTOSCOPY/URETEROSCOPY/HOLMIUM LASER/STENT PLACEMENT (Right: Ureter)     Patient location during evaluation: PACU Anesthesia Type: General Level of consciousness: awake and alert Pain management: pain level controlled Vital Signs Assessment: post-procedure vital signs reviewed and stable Respiratory status: spontaneous breathing, nonlabored ventilation, respiratory function stable and patient connected to nasal cannula oxygen Cardiovascular status: blood pressure returned to baseline and stable Postop Assessment: no apparent nausea or vomiting Anesthetic complications: no   No notable events documented.  Last Vitals:  Vitals:   06/06/24 1837 06/06/24 1845  BP: (!) 128/94 (!) 159/91  Pulse: 72 85  Resp: 16 17  Temp:    SpO2: 100% 99%    Last Pain:  Vitals:   06/06/24 1845  TempSrc:   PainSc: 0-No pain                 Cordella P Ricci Dirocco

## 2024-06-16 LAB — STONE ANALYSIS
Calcium Oxalate Monohydrate: 10 %
Uric Acid Calculi: 90 %
Weight Calculi: 20 mg
